# Patient Record
Sex: Male | Born: 1945 | Race: White | Hispanic: No | Marital: Married | Smoking: Former smoker
Health system: Southern US, Community
[De-identification: ages and names within clinical notes are randomized; demographics above are authoritative.]

## PROBLEM LIST (undated history)

## (undated) ENCOUNTER — Emergency Department (HOSPITAL_COMMUNITY): Admission: EM | Source: Home / Self Care

## (undated) DIAGNOSIS — G629 Polyneuropathy, unspecified: Secondary | ICD-10-CM

## (undated) DIAGNOSIS — K219 Gastro-esophageal reflux disease without esophagitis: Secondary | ICD-10-CM

## (undated) DIAGNOSIS — Z87442 Personal history of urinary calculi: Secondary | ICD-10-CM

## (undated) DIAGNOSIS — M1990 Unspecified osteoarthritis, unspecified site: Secondary | ICD-10-CM

## (undated) DIAGNOSIS — G4730 Sleep apnea, unspecified: Secondary | ICD-10-CM

## (undated) DIAGNOSIS — I252 Old myocardial infarction: Secondary | ICD-10-CM

## (undated) DIAGNOSIS — K5792 Diverticulitis of intestine, part unspecified, without perforation or abscess without bleeding: Secondary | ICD-10-CM

## (undated) DIAGNOSIS — C61 Malignant neoplasm of prostate: Secondary | ICD-10-CM

## (undated) DIAGNOSIS — Z9289 Personal history of other medical treatment: Secondary | ICD-10-CM

## (undated) DIAGNOSIS — J45909 Unspecified asthma, uncomplicated: Secondary | ICD-10-CM

## (undated) DIAGNOSIS — J449 Chronic obstructive pulmonary disease, unspecified: Secondary | ICD-10-CM

## (undated) DIAGNOSIS — Z9861 Coronary angioplasty status: Secondary | ICD-10-CM

## (undated) DIAGNOSIS — E7800 Pure hypercholesterolemia, unspecified: Secondary | ICD-10-CM

## (undated) DIAGNOSIS — I2510 Atherosclerotic heart disease of native coronary artery without angina pectoris: Secondary | ICD-10-CM

## (undated) DIAGNOSIS — Z87891 Personal history of nicotine dependence: Secondary | ICD-10-CM

## (undated) DIAGNOSIS — I10 Essential (primary) hypertension: Secondary | ICD-10-CM

## (undated) DIAGNOSIS — C4490 Unspecified malignant neoplasm of skin, unspecified: Secondary | ICD-10-CM

## (undated) DIAGNOSIS — Z794 Long term (current) use of insulin: Secondary | ICD-10-CM

## (undated) DIAGNOSIS — C801 Malignant (primary) neoplasm, unspecified: Secondary | ICD-10-CM

## (undated) DIAGNOSIS — M109 Gout, unspecified: Secondary | ICD-10-CM

## (undated) DIAGNOSIS — R0600 Dyspnea, unspecified: Secondary | ICD-10-CM

## (undated) DIAGNOSIS — E119 Type 2 diabetes mellitus without complications: Secondary | ICD-10-CM

## (undated) HISTORY — DX: Gastro-esophageal reflux disease without esophagitis: K21.9

## (undated) HISTORY — DX: Essential (primary) hypertension: I10

## (undated) HISTORY — DX: Gout, unspecified: M10.9

## (undated) HISTORY — DX: Old myocardial infarction: I25.2

## (undated) HISTORY — DX: Coronary angioplasty status: Z98.61

## (undated) HISTORY — DX: Long term (current) use of insulin: Z79.4

## (undated) HISTORY — PX: EYE SURGERY: SHX253

## (undated) HISTORY — PX: HUMERUS FRACTURE SURGERY: SHX670

## (undated) HISTORY — DX: Atherosclerotic heart disease of native coronary artery without angina pectoris: I25.10

## (undated) HISTORY — PX: OTHER SURGICAL HISTORY: SHX169

## (undated) HISTORY — DX: Type 2 diabetes mellitus without complications: E11.9

## (undated) HISTORY — PX: HERNIA REPAIR: SHX51

## (undated) HISTORY — PX: NASAL SINUS SURGERY: SHX719

## (undated) HISTORY — DX: Diverticulitis of intestine, part unspecified, without perforation or abscess without bleeding: K57.92

## (undated) HISTORY — DX: Pure hypercholesterolemia, unspecified: E78.00

## (undated) HISTORY — DX: Personal history of nicotine dependence: Z87.891

## (undated) HISTORY — PX: HEMORRHOID SURGERY: SHX153

---

## 2001-04-18 HISTORY — PX: CORONARY ANGIOPLASTY WITH STENT PLACEMENT: SHX49

## 2001-04-18 HISTORY — PX: CARDIAC CATHETERIZATION: SHX172

## 2001-04-22 ENCOUNTER — Inpatient Hospital Stay (HOSPITAL_COMMUNITY): Admission: EM | Admit: 2001-04-22 | Discharge: 2001-04-24 | Payer: Self-pay | Admitting: Cardiology

## 2001-04-22 DIAGNOSIS — I2510 Atherosclerotic heart disease of native coronary artery without angina pectoris: Secondary | ICD-10-CM

## 2001-04-22 HISTORY — DX: Atherosclerotic heart disease of native coronary artery without angina pectoris: I25.10

## 2008-06-24 HISTORY — PX: OTHER SURGICAL HISTORY: SHX169

## 2011-01-19 ENCOUNTER — Encounter: Payer: Self-pay | Admitting: Cardiovascular Disease

## 2011-01-20 ENCOUNTER — Ambulatory Visit: Payer: Self-pay | Admitting: Cardiovascular Disease

## 2011-01-20 ENCOUNTER — Ambulatory Visit (INDEPENDENT_AMBULATORY_CARE_PROVIDER_SITE_OTHER): Payer: Self-pay | Admitting: Cardiovascular Disease

## 2011-01-20 ENCOUNTER — Encounter: Payer: Self-pay | Admitting: Cardiovascular Disease

## 2011-01-20 ENCOUNTER — Encounter: Payer: Self-pay | Admitting: *Deleted

## 2011-01-20 DIAGNOSIS — E7800 Pure hypercholesterolemia, unspecified: Secondary | ICD-10-CM

## 2011-01-20 DIAGNOSIS — I2510 Atherosclerotic heart disease of native coronary artery without angina pectoris: Secondary | ICD-10-CM

## 2011-01-20 DIAGNOSIS — I10 Essential (primary) hypertension: Secondary | ICD-10-CM

## 2011-01-20 DIAGNOSIS — R079 Chest pain, unspecified: Secondary | ICD-10-CM

## 2011-01-20 MED ORDER — NITROGLYCERIN 0.4 MG SL SUBL: 0.4000 mg | SUBLINGUAL_TABLET | SUBLINGUAL | Status: DC | PRN

## 2011-01-20 MED ORDER — SIMVASTATIN 40 MG PO TABS: 40.0000 mg | ORAL_TABLET | Freq: Every evening | ORAL | Status: DC

## 2011-01-20 NOTE — Patient Instructions (Addendum)
Your physician has requested that you have a cardiac catheterization. Cardiac catheterization is used to diagnose and/or treat various heart conditions. Doctors may recommend this procedure for a number of different reasons. The most common reason is to evaluate chest pain. Chest pain can be a symptom of coronary artery disease (CAD), and cardiac catheterization can show whether plaque is narrowing or blocking your heart's arteries. This procedure is also used to evaluate the valves, as well as measure the blood flow and oxygen levels in different parts of your heart. For further information please visit [email or web address] Please follow instruction sheet, as given. Your physician recommends that you go to the [facility] for lab work: BMET/CBC/PT/PTT. DO TODAY! Start Simvastatin 40 mg Take 1 tablet by mouth every night.  Nitroglycerin Place one tablet under tongue every 5 minutes up to 3 doses as needed for chest pain. No more than 3 doses over a 15 minute period.

## 2011-01-21 ENCOUNTER — Encounter: Payer: Self-pay | Admitting: Cardiovascular Disease

## 2011-01-21 DIAGNOSIS — I10 Essential (primary) hypertension: Secondary | ICD-10-CM | POA: Insufficient documentation

## 2011-01-21 DIAGNOSIS — E7800 Pure hypercholesterolemia, unspecified: Secondary | ICD-10-CM | POA: Insufficient documentation

## 2011-01-21 NOTE — Assessment & Plan Note (Signed)
The patient has known history of coronary artery disease status post angioplasty and stent placement in 2003 to the proximal left anterior descending artery. The patient is describing recurrent anginal-type chest pain similar to his previous symptoms in 2003. The symptoms have been progressive and currently he is in [place] Heart Association class III. The symptoms are clearly interfering with his activities of daily living. Due to all of that, I recommend proceeding with cardiac catheterization and possible coronary intervention. Risks, benefits and alternatives were discussed with the patient. He is to continue aspirin daily. He is already on atenolol and amlodipine. I would also start him back on simvastatin 40 mg once daily for plaque stabilization.

## 2011-01-21 NOTE — Assessment & Plan Note (Signed)
His blood pressure is slightly elevated and will be monitored.

## 2011-01-21 NOTE — Assessment & Plan Note (Signed)
Given that the patient has type 2 diabetes as well as established coronary artery disease, there is an indication for a statin regardless of his baseline lipid profile.

## 2011-01-21 NOTE — Progress Notes (Signed)
HPI  This is a 65 year old gentleman who is here today for evaluation of chest pain. The patient has known history of coronary artery disease. He presented in 2003 with chest pain suggestive of unstable angina. He underwent cardiac catheterization at that time which showed a 60-70% stenosis in the proximal LAD. He had an IVUS interrogation which confirmed plaque rupture in that area. He had an angioplasty in bare-metal stent placement at that time without complications. Since then, he has not had any cardiac events. He has not been seen by [place] since then. He does have chronic medical conditions that include hypertension and type 2 diabetes. He is a previous smoker. The patient started having chest pain about 4 weeks ago similar to his previous symptoms in 2003. He describes substernal chest tightness lasting for about 10-15 minutes with activities. This is associated with significant dyspnea. It has been progressive since then and he cut down on his physical activity significantly due to the symptoms. He has not had chest pain at rest. He had one prolonged episode. He took nitroglycerin from his friend and his symptoms resolved completely. The patient is not on a statin anymore.  No Known Allergies   No current outpatient prescriptions on file prior to visit.     Past Medical History  Diagnosis Date  . Old myocardial infarction   . Type II or unspecified type diabetes mellitus without mention of complication, not stated as uncontrolled     TYPE 11  . Encounter for long-term (current) use of insulin   . Gout, unspecified   . Personal history of tobacco use, presenting hazards to health   . Diverticulitis   . Postsurgical percutaneous transluminal coronary angioplasty status   . Esophageal reflux   . Coronary atherosclerosis of native coronary artery  04/22/2001       Cardiac Catheterization  . Pure hypercholesterolemia   . Unspecified essential hypertension      Past Surgical History    Procedure Date  . Hemorrhoid surgery   . Hernia repair   . Carotid stents   . 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 Cardiac catheterization 2003    70% proximal LAD (plaque rupture), 40 % mid. Left dominent  . Coronary angioplasty with stent placement 2003    Prox LAD: 3.0 X12 BMS     Family History  Problem Relation Age of Onset  . Coronary artery disease      Family History     History   Social History  . Marital Status: Married    Spouse Name: PATRICIA    Number of Children: N/A  . Years of Education: N/A   Occupational History  . SELF EMPLOYED    Social History Main Topics  . Smoking status: Former Smoker    Quit date: 04/18/1985  . Smokeless tobacco: Not on file  . Alcohol Use: Yes     Occasionaly  . Drug Use: No  . Sexually Active: Not on file   Other Topics Concern  . Not on file   Social History Narrative  . No narrative on file     ROS Constitutional: Negative for fever, chills, diaphoresis, activity change, appetite change and fatigue.  HENT: Negative for hearing loss, nosebleeds, congestion, sore throat, facial swelling, drooling, trouble swallowing, neck pain, voice change, sinus pressure and tinnitus.  Eyes: Negative for photophobia, pain, discharge and visual disturbance.  Respiratory: Negative for apnea, cough,  and wheezing.  Cardiovascular: Negative for  palpitations and leg swelling.  Gastrointestinal: Negative for nausea, vomiting, abdominal pain, diarrhea, constipation, blood in stool and abdominal distention.  Genitourinary: Negative for dysuria, urgency, frequency, hematuria and decreased urine volume.  Musculoskeletal: Negative for myalgias, back pain, joint swelling, arthralgias and gait problem.  Skin: Negative for color change, pallor, rash and wound.  Neurological: Negative for dizziness, tremors, seizures, syncope, speech difficulty, weakness, light-headedness, numbness and headaches.   Psychiatric/Behavioral: Negative for suicidal ideas, hallucinations, behavioral problems and agitation. The patient is not nervous/anxious.     PHYSICAL EXAM   BP 155/80  Pulse 77  Ht 5\' 10"  (1.778 m)  Wt 221 lb 12.8 oz (100.608 kg)  BMI 31.83 kg/m2  Constitutional: He is oriented to person, place, and time. He appears well-developed and well-nourished. No distress.  HENT: No nasal discharge.  Head: Normocephalic and atraumatic.  Eyes: Pupils are equal, round, and reactive to light. Right eye exhibits no discharge. Left eye exhibits no discharge.  Neck: Normal range of motion. Neck supple. No JVD present. No thyromegaly present.  Cardiovascular: Normal rate, regular rhythm, normal heart sounds and intact distal pulses. Exam reveals no gallop and no friction rub.  No murmur heard.  Pulmonary/Chest: Effort normal and breath sounds normal. No stridor. No respiratory distress. He has no wheezes. He has no rales. He exhibits no tenderness.  Abdominal: Soft. Bowel sounds are normal. He exhibits no distension. There is no tenderness. There is no rebound and no guarding.  Musculoskeletal: Normal range of motion. He exhibits no edema and no tenderness.  Neurological: He is alert and oriented to person, place, and time. Coordination normal.  Skin: Skin is warm and dry. No rash noted. He is not diaphoretic. No erythema. No pallor.  Psychiatric: He has a normal mood and affect. His behavior is normal. Judgment and thought content normal.      EKG: Normal sinus rhythm with no significant ST or T wave changes. Possible old inferior infarct with Q waves in 3 and aVF   ASSESSMENT AND PLAN

## 2011-01-26 ENCOUNTER — Inpatient Hospital Stay (HOSPITAL_BASED_OUTPATIENT_CLINIC_OR_DEPARTMENT_OTHER)
Admission: RE | Admit: 2011-01-26 | Discharge: 2011-01-26 | Disposition: A | Discharge status: Hospice | Payer: Self-pay | Source: Ambulatory Visit | Attending: Cardiovascular Disease | Admitting: Cardiovascular Disease

## 2011-01-26 ENCOUNTER — Ambulatory Visit (HOSPITAL_COMMUNITY)
Admission: AD | Admit: 2011-01-26 | Discharge: 2011-01-28 | Disposition: A | Discharge status: Home / Self Care | Payer: Self-pay | Source: Ambulatory Visit | Attending: Cardiovascular Disease | Admitting: Cardiovascular Disease

## 2011-01-26 DIAGNOSIS — I214 Non-ST elevation (NSTEMI) myocardial infarction: Secondary | ICD-10-CM | POA: Insufficient documentation

## 2011-01-26 DIAGNOSIS — I2510 Atherosclerotic heart disease of native coronary artery without angina pectoris: Secondary | ICD-10-CM | POA: Insufficient documentation

## 2011-01-26 DIAGNOSIS — T82897A Other specified complication of cardiac prosthetic devices, implants and grafts, initial encounter: Secondary | ICD-10-CM | POA: Insufficient documentation

## 2011-01-26 DIAGNOSIS — K219 Gastro-esophageal reflux disease without esophagitis: Secondary | ICD-10-CM | POA: Insufficient documentation

## 2011-01-26 DIAGNOSIS — I10 Essential (primary) hypertension: Secondary | ICD-10-CM | POA: Insufficient documentation

## 2011-01-26 DIAGNOSIS — I519 Heart disease, unspecified: Secondary | ICD-10-CM | POA: Insufficient documentation

## 2011-01-26 DIAGNOSIS — Y831 Surgical operation with implant of artificial internal device as the cause of abnormal reaction of the patient, or of later complication, without mention of misadventure at the time of the procedure: Secondary | ICD-10-CM | POA: Insufficient documentation

## 2011-01-26 DIAGNOSIS — E119 Type 2 diabetes mellitus without complications: Secondary | ICD-10-CM | POA: Insufficient documentation

## 2011-01-26 DIAGNOSIS — E785 Hyperlipidemia, unspecified: Secondary | ICD-10-CM | POA: Insufficient documentation

## 2011-01-26 DIAGNOSIS — Z9861 Coronary angioplasty status: Secondary | ICD-10-CM | POA: Insufficient documentation

## 2011-01-26 DIAGNOSIS — Y9210 Unspecified residential institution as the place of occurrence of the external cause: Secondary | ICD-10-CM | POA: Insufficient documentation

## 2011-01-26 DIAGNOSIS — Y840 Cardiac catheterization as the cause of abnormal reaction of the patient, or of later complication, without mention of misadventure at the time of the procedure: Secondary | ICD-10-CM | POA: Insufficient documentation

## 2011-01-26 HISTORY — PX: CORONARY ANGIOPLASTY WITH STENT PLACEMENT: SHX49

## 2011-01-26 HISTORY — PX: CARDIAC CATHETERIZATION: SHX172

## 2011-01-26 LAB — POCT ACTIVATED CLOTTING TIME
Activated Clotting Time: 171 seconds
Activated Clotting Time: 584 seconds

## 2011-01-26 LAB — POCT I-STAT GLUCOSE: Glucose, Bld: 220 mg/dL — ABNORMAL HIGH (ref 70–99)

## 2011-01-26 LAB — GLUCOSE, CAPILLARY
Glucose-Capillary: 197 mg/dL — ABNORMAL HIGH (ref 70–99)
Glucose-Capillary: 302 mg/dL — ABNORMAL HIGH (ref 70–99)

## 2011-01-27 LAB — CBC
HCT: 38 % — ABNORMAL LOW (ref 39.0–52.0)
Hemoglobin: 14 g/dL (ref 13.0–17.0)
MCH: 33.3 pg (ref 26.0–34.0)
MCHC: 36.8 g/dL — ABNORMAL HIGH (ref 30.0–36.0)
RBC: 4.2 MIL/uL — ABNORMAL LOW (ref 4.22–5.81)

## 2011-01-27 LAB — BASIC METABOLIC PANEL
BUN: 17 mg/dL (ref 6–23)
CO2: 25 mEq/L (ref 19–32)
GFR calc non Af Amer: >90 mL/min (ref >90)
Glucose, Bld: 325 mg/dL — ABNORMAL HIGH (ref 70–99)
Potassium: 3.7 mEq/L (ref 3.5–5.1)
Sodium: 135 mEq/L (ref 135–145)

## 2011-01-27 LAB — GLUCOSE, CAPILLARY
Glucose-Capillary: 355 mg/dL — ABNORMAL HIGH (ref 70–99)
Glucose-Capillary: 329 mg/dL — ABNORMAL HIGH (ref 70–99)
Glucose-Capillary: 240 mg/dL — ABNORMAL HIGH (ref 70–99)
Glucose-Capillary: 324 mg/dL — ABNORMAL HIGH (ref 70–99)

## 2011-01-27 LAB — CARDIAC PANEL(CRET KIN+CKTOT+MB+TROPI)
CK, MB: 45.9 ng/mL (ref 0.3–4.0)
CK, MB: 42.4 ng/mL (ref 0.3–4.0)
Relative Index: 8.5 — ABNORMAL HIGH (ref 0.0–2.5)
Total CK: 538 U/L — ABNORMAL HIGH (ref 7–232)
Troponin I: 19.13 ng/mL (ref <0.30)

## 2011-01-28 LAB — CARDIAC PANEL(CRET KIN+CKTOT+MB+TROPI)
CK, MB: 13.8 ng/mL (ref 0.3–4.0)
Total CK: 243 U/L — ABNORMAL HIGH (ref 7–232)
Troponin I: 7.2 ng/mL (ref <0.30)

## 2011-01-28 LAB — BASIC METABOLIC PANEL
Calcium: 8.7 mg/dL (ref 8.4–10.5)
GFR calc Af Amer: >90 mL/min (ref >90)
GFR calc non Af Amer: 86 mL/min — ABNORMAL LOW (ref >90)
Glucose, Bld: 277 mg/dL — ABNORMAL HIGH (ref 70–99)
Potassium: 3.6 mEq/L (ref 3.5–5.1)
Sodium: 136 mEq/L (ref 135–145)

## 2011-01-28 LAB — GLUCOSE, CAPILLARY: Glucose-Capillary: 328 mg/dL — ABNORMAL HIGH (ref 70–99)

## 2011-02-01 NOTE — Cardiovascular Report (Signed)
NAME:  [name], RUDDY NO.:  [id number]  MEDICAL RECORD NO.:  [id number]  LOCATION:  [facility]                         FACILITY:  [facility]  PHYSICIAN:  Lorine Bears, MD     DATE OF BIRTH:  01/15/1946  DATE OF PROCEDURE:  01/26/2011 DATE OF DISCHARGE:                           CARDIAC CATHETERIZATION     PROCEDURES PERFORMED: 1. Left heart catheterization. 2. Coronary angiography. 3. Left ventricular angiography.  INDICATION AND CLINICAL HISTORY:  This is a 65 year old gentleman with known history of coronary artery disease status post angioplasty and bare-metal stent placement to the LAD in 2003.  He presented with progressive symptoms of exertional chest pain happening with minimal activities recently requiring nitroglycerin.  Due to his symptoms and previous cardiac history, cardiac catheterization and possible coronary intervention were recommended.  Risks, benefits, and alternatives were discussed with the patient.  STUDY DETAILS:  A standard informed consent was obtained.  The right groin area was prepped in a sterile fashion.  It was anesthetized with 1% lidocaine.  A 4-French sheath was placed in the right femoral artery after an anterior puncture.  Coronary angiography was performed with a JL-4, 3-DRC, and a pigtail catheter.  All catheter exchanges were done over the wire.  The patient tolerated the procedure well with no immediate complications.  STUDY FINDINGS:  Hemodynamic findings:  Left ventricular pressure is 176/17, with a left ventricular end-diastolic pressure of 19 mmHg. Aortic pressure is 175/75 with a mean pressure of 118 mmHg. Left ventricular angiography:  This showed normal LV systolic function and wall motion with an estimated ejection fraction of 65%.  No significant mitral regurgitation is noted.  Coronary angiography: 1. Left main coronary artery:  The vessel is normal in size with minor     irregularities but no evidence of  obstructive disease. 2. Left circumflex artery:  The vessel is very large in size and     dominant.  It is mildly calcified in the proximal segment with a     30% proximal stenosis.  The rest of the circumflex has minor     irregularities.  OM-1 is a very small-sized branch.  OM-2 is normal     in size with 30-40% ostial stenosis.  OM-3 is normal in size with     mild diffuse 20% disease.  The posterior AV groove is a large size     branch that gives PDA and posterolateral branches.  All have mild     diffuse atherosclerosis but no evidence of obstructive disease. 3. Left anterior descending artery:  The vessel is large in size and     wraps around the apex distally.  A stent is noted proximally with     minimal in-stent restenosis.  It appears that there is another     stent in the mid segment which is also patent with minimal     restenosis.  In the mid LAD between the 2 segments, there seems to     be a severe 90% eccentric stenosis.  The rest of the LAD has mild     atherosclerosis but no evidence of obstructive disease.  Thediagonal branches are medium in size and free of  significant     disease. 4. Right coronary artery:  The vessel is small in size and     nondominant.  It has mild diffuse atherosclerosis but no     obstructive disease.  STUDY CONCLUSIONS: 1. Severe single-vessel coronary artery disease with a 90% eccentric     stenosis in the mid left anterior descending artery. 2. Normal LV systolic function. 3. Recommend urgent angioplasty and stent placement to the left     anterior descending artery.     Lorine Bears, MD     MA/MEDQ  D:  01/26/2011  T:  01/26/2011  Job:  [id number]  Electronically Signed by Lorine Bears MD on 02/01/2011 04:29:13 PM

## 2011-02-01 NOTE — Cardiovascular Report (Signed)
  NAME:  [name], [name] NO.:  [id number]  MEDICAL RECORD NO.:  [id number]  LOCATION:  2501                         FACILITY:  [facility]  PHYSICIAN:  Lorine Bears, MD     DATE OF BIRTH:  25-Jan-1946  DATE OF PROCEDURE:  01/26/2011 DATE OF DISCHARGE:                           CARDIAC CATHETERIZATION   PROCEDURES PERFORMED:  Left anterior descending artery, angioplasty, and bare-metal stent placement, 90% mid stenosis resulting an TIMI-3 flow and 10% residual stenosis.  INDICATION AND CLINICAL HISTORY:  Please refer to cardiac catheterization from today for full indications and the clinical findings.  STUDY DETAILS:  The 4-French sheath was exchanged to a 6-French sheath. IV bivalirudin was initiated with therapeutic ACT.  The patient was already loaded on aspirin and Plavix.  An XB 3.5 guiding catheter was used.  The lesion was crossed with a P[place] wire.  I tried to pre- dilate the lesion with a 2.5 x 15 Sprinter balloon, but that balloon did not cross.  I thus decided to place another wire as a buddy and used a BMW wire.  I then was able to cross with a 2.0 x 12 mm Emerge balloon. The lesion was dilated twice to 10 atmospheres and 12 atmospheres.  I then tried to use a 2.75 cutting balloon, but that did not cross partially due to a previously placed stent and also calcifications.  I decided to use a 2.75 x 12 mm Bee Trek balloon and the lesion was dilated to 16 atmospheres.  I then placed a 3.0 x 15 mm Multilink vision stent and deployed it to 14 atmospheres.  This was post dilated with an [place] Sprinter 3.25 x 12 mm to 18 atmospheres.  Angiography showed good results with 10% residual stenosis. medium-size septal branch was jailed by the stent with TIMI 0 flow. This was not rescued.   STUDY CONCLUSIONS:  1. Successful angioplasty and bare-metal stent placement to the mid LAD.  The stenosis appears to be just distal to a previously placed stent.  This was a  difficult procedure due to difficulty in crossing the lesion with a balloon due to eccentricity and the calcifications. 2. a septal branch was jailed by the stent with subsequent TIMI 0 flow. This branch was medium in size and about 1.5 mm in diameter. It was not rescued.  RECOMMENDATIONS:  I recommend dual antiplatelet therapy for at least 3 months and if possible for 1 full year.  Blood pressure control is recommended as well as aggressive treatment of risk factors and hyperlipidemia.     Lorine Bears, MD     MA/MEDQ  D:  01/26/2011  T:  01/26/2011  Job:  [id number]  Electronically Signed by Lorine Bears MD on 02/01/2011 04:32:16 PM

## 2011-02-13 NOTE — Discharge Summary (Signed)
NAMEMarland Kitchen  KIPPER, BUCH NO.:  [id number]  MEDICAL RECORD NO.:  [id number]  LOCATION:  2501                         FACILITY:  [facility]  PHYSICIAN:  Veverly Fells. Excell Seltzer, MD  DATE OF BIRTH:  [birth year]  DATE OF ADMISSION:  01/26/2011 DATE OF DISCHARGE:  01/28/2011                              DISCHARGE SUMMARY   PRIMARY CARDIOLOGIST:  Lorine Bears, MD  PRIMARY CARE PROVIDER:  Marjean Donna in [place], [place].  DISCHARGE DIAGNOSIS:  Coronary artery disease with postprocedure non-ST- elevation myocardial infarction.  SECONDARY DIAGNOSES: 1. Type 2 diabetes mellitus. 2. Hypertension. 3. Hyperlipidemia. 4. Gastroesophageal reflux disease. 5. Gout. 6. History of diverticulitis. 7. History of carotid arterial disease status post stenting.  ALLERGIES:  No known drug allergies.  PROCEDURES:  Left heart cardiac catheterization performed on January 26, 2011, revealing a 90% stenosis within the proximal LAD between the 2 previously placed patent stents.  The patient otherwise had nonobstructive disease in a nondominant right coronary artery.  The LAD was successfully stented with placement of a 3.0 x 15-mm Multi-Link Vision bare metal stent.  HISTORY OF PRESENT ILLNESS:  A 65 year old male with prior history of CAD and LAD stenting in 2003, who was recently seen in clinic by Dr. Kirke Corin on January 20, 2011, secondary to a 4-week history of recurrent substernal exertional chest tightness.  Decision was made to proceed with diagnostic catheterization.  HOSPITAL COURSE:  The patient presented to the [facility] on January 26, 2011, where diagnostic catheterization was carried out revealing a patent stent in the proximal LAD followed by a 90% stenosis and then subsequently another patent stent in the proximal-to-mid LAD. The patient had nonobstructive disease throughout the left circumflex and a nondominant right coronary artery.  EF was 65%.  Attention  was turned to the LAD between the 2 previously stented segments and this area was successfully stented with a 3.0 x 15-mm Vision bare metal stent.  Immediately postprocedure, the patient did complain of chest discomfort, though his ECG remained without acute changes.  The patient was markedly hypertensive following percutaneous intervention requiring titration of IV nitroglycerin as well as p.r.n. labetalol and hydralazine.  The patient continued to complain of intermittent chest discomfort throughout the night without acute changes on his ECG and followup cardiac markers revealed a peak CK of 538 with an MB of 45.9, troponin I of 19.49.  Films were reviewed and it was noted that in the process of stenting of the LAD, a small septal perforator branch was occluded, thus explained the patient's symptoms and periprocedural infarct.  He has been.  He was placed on a long-acting nitrate therapy and his beta-blocker and ACE inhibitor have been titrated for better blood pressure control.  With these changes, the patient had no recurrence of chest discomfort in the past 24 hours and has been ambulating without difficulty.  His enzymes are now trending down and we plan to discharge him home today in good condition.  DISCHARGE LABORATORY DATA:  Hemoglobin 14.0, hematocrit 38.0, WBC 10.0, platelets 128.  Sodium 136, potassium 3.6, chloride 101, CO2 of 27, BUN 18, creatinine 0.97, glucose 277.  CK 243, MB 13.0, troponin I 7.20.  DISPOSITION:  The patient will be discharged home today in good condition.  FOLLOWUP PLANS AND APPOINTMENTS:  We have arranged for followup with Dr. Kirke Corin in our [place] office on February 15, 2011, at 1:30 p.m.  He will follow up with Dr. Verlan Friends in [place] in the next 3-4 weeks.  DISCHARGE MEDICATIONS: 1. Carvedilol 25 mg b.i.d. 2. Plavix 75 mg daily. 3. Imdur 30 mg daily. 4. Lisinopril 20 mg daily/ 5. Lantus 44 units subcu at bedtime. 6. Amlodipine 5 mg daily. 7.  Aspirin 81 mg daily. 8. Fexofenadine 180 mg daily. 9. Glimepiride 4 mg b.i.d. 10.Metformin 1000 mg b.i.d. 11.Multivitamin 1 tablet daily. 12.Nitroglycerin 0.4 mg subcu p.r.n. chest pain. 13.Protonix 40 mg daily. 14.Simvastatin 40 mg at bedtime. 15.Zolpidem 5 mg a quarter of a tablet at bedtime p.r.n.  OUTSTANDING LABS AND STUDIES:  Followup with LFTs in 6-8 weeks.  The patient was recently placed on statin therapy in the outpatient setting.  DURATION OF DISCHARGE ENCOUNTER:  Sixty minutes including physician time.     Nicolasa Ducking, ANP   ______________________________ Veverly Fells. Excell Seltzer, MD    CB/MEDQ  D:  01/28/2011  T:  01/28/2011  Job:  [id number]  cc:   Marjean Donna  Electronically Signed by Nicolasa Ducking ANP on 02/08/2011 04:17:01 PM Electronically Signed by Tonny Bollman MD on 02/13/2011 10:04:50 PM

## 2011-02-15 ENCOUNTER — Encounter: Payer: Self-pay | Admitting: Cardiovascular Disease

## 2011-02-15 ENCOUNTER — Ambulatory Visit (INDEPENDENT_AMBULATORY_CARE_PROVIDER_SITE_OTHER): Payer: Self-pay | Admitting: Cardiovascular Disease

## 2011-02-15 DIAGNOSIS — I2510 Atherosclerotic heart disease of native coronary artery without angina pectoris: Secondary | ICD-10-CM

## 2011-02-15 DIAGNOSIS — E7800 Pure hypercholesterolemia, unspecified: Secondary | ICD-10-CM

## 2011-02-15 DIAGNOSIS — I10 Essential (primary) hypertension: Secondary | ICD-10-CM

## 2011-02-15 MED ORDER — CLOPIDOGREL BISULFATE 75 MG PO TABS: 75.0000 mg | ORAL_TABLET | Freq: Every day | ORAL | Status: AC

## 2011-02-15 NOTE — Assessment & Plan Note (Signed)
His blood pressure is now well controlled. Continue current medications. Imdur will be stopped as mentioned above.

## 2011-02-15 NOTE — Assessment & Plan Note (Signed)
He tells me that he had his labs checked with Dr. Verlan Friends his primary care physician. I recommend that he stays on simvastatin even if his cholesterol is low given that his coronary arteries are relatively heavily calcified.

## 2011-02-15 NOTE — Assessment & Plan Note (Signed)
The patient had a successful angioplasty and bare-metal stent placement to a severely stenosed mid LAD. He is not having any more anginal chest pain but is complaining of headache which is likely due to Imdur. Thus, I will stop this medication. He is to continue Plavix for a total of 3 months. It can be stopped in January. He is to continue aspirin indefinitely. Aggressive treatment of risk factors is recommended.

## 2011-02-15 NOTE — Progress Notes (Signed)
HPI  This is a 65 year old man who is here today for followup visit. He was seen recently for classic anginal chest pain with minimal activities. He has known history of coronary artery disease status post angioplasty and bare-metal stent placement to the left anterior descending artery in 2003. Due to his symptoms and previous cardiac history, I decided to proceed with cardiac catheterization which showed a patent stent in the proximal LAD segment. However, there was a 90% calcified and eccentric lesion in the midsegment. He underwent an angioplasty and bare-metal stent placement. In the process, a septal branch was jailed which caused TIMI 0 flow. The branch was overall small in size and was not rescued. He did have periprocedural myocardial infarction with elevated cardiac enzymes. He was treated conservatively for that with a pressure control and nitroglycerin. He was discharged home without any other issues. Since his discharge, he hasn't had any exertional chest pain. He did have a few episodes of sharp chest discomfort which is different from his previous symptoms. He does complain of headache though since he was discharged from the hospital.  No Known Allergies   Current Outpatient Prescriptions on File Prior to Visit  Medication Sig Dispense Refill  . amLODipine (NORVASC) 5 MG tablet Take 5 mg by mouth daily.        Marland Kitchen aspirin 81 MG tablet Take 81 mg by mouth daily.        . fexofenadine (ALLEGRA) 180 MG tablet Take 180 mg by mouth daily.        Marland Kitchen glimepiride (AMARYL) 4 MG tablet Take 4 mg by mouth 2 (two) times daily.        . insulin glargine (LANTUS) 100 UNIT/ML injection Inject 40 Units into the skin at bedtime.        . metFORMIN (GLUCOPHAGE) 1000 MG tablet Take 1,000 mg by mouth 2 (two) times daily.        . Multiple Vitamins-Minerals (CENTRUM PO) Take by mouth daily.        . nitroGLYCERIN (NITROSTAT) 0.4 MG SL tablet Place 1 tablet (0.4 mg total) under the tongue every 5 (five) minutes  as needed for chest pain.  25 tablet  3  . pantoprazole (PROTONIX) 40 MG tablet Take 40 mg by mouth daily.        . simvastatin (ZOCOR) 40 MG tablet Take 1 tablet (40 mg total) by mouth every evening.  30 tablet  6  . zolpidem (AMBIEN) 5 MG tablet Take 1/4 tab every evening            Past Medical History  Diagnosis Date  . Old myocardial infarction   . Type II or unspecified type diabetes mellitus without mention of complication, not stated as uncontrolled     TYPE 11  . Encounter for long-term (current) use of insulin   . Gout, unspecified   . Personal history of tobacco use, presenting hazards to health   . Diverticulitis   . Postsurgical percutaneous transluminal coronary angioplasty status   . Esophageal reflux   . Coronary atherosclerosis of native coronary artery  04/22/2001       Cardiac Catheterization  . Pure hypercholesterolemia   . Unspecified essential hypertension      Past Surgical History  Procedure Date  . Hemorrhoid surgery   . Hernia repair   . Carotid stents   . 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 Cardiac catheterization 2003    70% proximal  LAD (plaque rupture), 40 % mid. Left dominent  . Coronary angioplasty with stent placement 2003    Prox LAD: 3.0 X12 BMS  . Cardiac catheterization 01/26/2011    LAD: Patent proximal stent. 90% calcified eccentic stenosis in med segment, mild LCX disease  . Coronary angioplasty with stent placement 01/26/2011    Mid LAD: 3.0 X15 mm Vision BMS. Complicated by a jailed septal branch and periprocedural MI     Family History  Problem Relation Age of Onset  . Coronary artery disease      Family History     History   Social History  . Marital Status: Married    Spouse Name: PATRICIA    Number of Children: N/A  . Years of Education: N/A   Occupational History  . SELF EMPLOYED    Social History Main Topics  . Smoking status: Former Smoker    Quit date: 04/18/1985  .  Smokeless tobacco: Not on file  . Alcohol Use: Yes     Occasionaly  . Drug Use: No  . Sexually Active: Not on file   Other Topics Concern  . Not on file   Social History Narrative  . No narrative on file     PHYSICAL EXAM   BP 117/78  Pulse 89  Resp 16  Ht 5\' 10"  (1.778 m)  Wt 222 lb (100.699 kg)  BMI 31.85 kg/m2  Constitutional: He is oriented to person, place, and time. He appears well-developed and well-nourished. No distress.  HENT: No nasal discharge.  Head: Normocephalic and atraumatic.  Eyes: Pupils are equal, round, and reactive to light. Right eye exhibits no discharge. Left eye exhibits no discharge.  Neck: Normal range of motion. Neck supple. No JVD present. No thyromegaly present.  Cardiovascular: Normal rate, regular rhythm, normal heart sounds and intact distal pulses. Exam reveals no gallop and no friction rub.  No murmur heard.  Pulmonary/Chest: Effort normal and breath sounds normal. No stridor. No respiratory distress. He has no wheezes. He has no rales. He exhibits no tenderness.  Abdominal: Soft. Bowel sounds are normal. He exhibits no distension. There is no tenderness. There is no rebound and no guarding.  Musculoskeletal: Normal range of motion. He exhibits no edema and no tenderness.  Neurological: He is alert and oriented to person, place, and time. Coordination normal.  Skin: Skin is warm and dry. No rash noted. He is not diaphoretic. No erythema. No pallor.  Psychiatric: He has a normal mood and affect. His behavior is normal. Judgment and thought content normal.  Right groin is intact with no hematoma or bruit.      ASSESSMENT AND PLAN

## 2011-02-15 NOTE — Patient Instructions (Signed)
Your physician wants you to follow-up in: 4 months. You will receive a reminder letter in the mail one-two months in advance. If you don't receive a letter, please call our office to schedule the follow-up appointment. Stop Imdur (isosorbide) Stop Plavix on 04/28/2011.

## 2011-04-19 DIAGNOSIS — I252 Old myocardial infarction: Secondary | ICD-10-CM

## 2011-04-19 HISTORY — DX: Old myocardial infarction: I25.2

## 2013-02-07 ENCOUNTER — Other Ambulatory Visit: Payer: Self-pay | Admitting: Cardiovascular Disease

## 2013-04-18 HISTORY — PX: OTHER SURGICAL HISTORY: SHX169

## 2013-12-10 ENCOUNTER — Ambulatory Visit (INDEPENDENT_AMBULATORY_CARE_PROVIDER_SITE_OTHER): Payer: Medicare Other | Admitting: Cardiology

## 2013-12-10 ENCOUNTER — Encounter: Payer: Self-pay | Admitting: Cardiology

## 2013-12-10 VITALS — BP 151/79 | HR 90 | Ht 70.0 in | Wt 223.0 lb

## 2013-12-10 DIAGNOSIS — I2510 Atherosclerotic heart disease of native coronary artery without angina pectoris: Secondary | ICD-10-CM

## 2013-12-10 DIAGNOSIS — R0789 Other chest pain: Secondary | ICD-10-CM

## 2013-12-10 DIAGNOSIS — I10 Essential (primary) hypertension: Secondary | ICD-10-CM

## 2013-12-10 MED ORDER — ATORVASTATIN CALCIUM 80 MG PO TABS: 80.0000 mg | ORAL_TABLET | Freq: Every day | ORAL | Status: DC

## 2013-12-10 MED ORDER — AMLODIPINE BESYLATE 10 MG PO TABS: 10.0000 mg | ORAL_TABLET | Freq: Every day | ORAL | Status: DC

## 2013-12-10 NOTE — Progress Notes (Signed)
Clinical Summary Mr. Nikolai is a 68 y.o.male last seen by Dr Fletcher Anon approx 4 years ago, this is our first visit together. He is seen today for the following medical problems.  1. CAD - hx of BMS to LAD in 2003 - last cath 01/2011 with LM patent, LAD with prox stent mild ISR, patent mid stent, 90% mid LAD lesion. LCX 30% prox, OM2 30-40%, RCA small non-dom with no disease. Received additional stent to LAD at that time, a septal [place] was jailed. LVEF 65% by LV gram. - imdur previously caused headaches, now off  - has had some chest pain recently.  Sharp pain right side, 9/10. Can occur at rest or with exertion. +SOB. Took NG with some improvement. Not positional. Pain lasted approx 5-10 minutes. Has had 4-5 episodes over the last 6 months. Increase in frequency. Has had some increased in frequency.  - stable DOE with activities, mainly walking uphill.  - admitted to [facility], discharged after 24 hrs. CT PE negative. EKG without acute ischemic changes. Other records not available at this time.  - reports stress test recently, approx 3 months ago in [place], [place] by a Dr Su Monks  2. HTN - does not check regularly - compliant with meds, though has not taken yet today.  3. Hyperlipidemia - compliant with statin   Past Medical History  Diagnosis Date  . Old myocardial infarction   . Type II or unspecified type diabetes mellitus without mention of complication, not stated as uncontrolled     TYPE 11  . Encounter for long-term (current) use of insulin   . Gout, unspecified   . Personal history of tobacco use, presenting hazards to health   . Diverticulitis   . Postsurgical percutaneous transluminal coronary angioplasty status   . Esophageal reflux   . Coronary atherosclerosis of native coronary artery  04/22/2001       Cardiac Catheterization  . Pure hypercholesterolemia   . Unspecified essential hypertension      No Known Allergies   Current Outpatient  Prescriptions  Medication Sig Dispense Refill  . ALPRAZOLAM PO Take by mouth as needed.        Marland Kitchen amLODipine (NORVASC) 5 MG tablet Take 5 mg by mouth daily.        Marland Kitchen aspirin 81 MG tablet Take 81 mg by mouth daily.        . carvedilol (COREG) 25 MG tablet Take 25 mg by mouth 2 (two) times daily with a meal.        . fexofenadine (ALLEGRA) 180 MG tablet Take 180 mg by mouth daily.        Marland Kitchen glimepiride (AMARYL) 4 MG tablet Take 4 mg by mouth 2 (two) times daily.        . insulin glargine (LANTUS) 100 UNIT/ML injection Inject 40 Units into the skin at bedtime.        . insulin lispro (HUMALOG) 100 UNIT/ML injection Inject into the skin as directed.        . metFORMIN (GLUCOPHAGE) 1000 MG tablet Take 1,000 mg by mouth 2 (two) times daily.        . Multiple Vitamins-Minerals (CENTRUM PO) Take by mouth daily.        Marland Kitchen Neomycin-Polymyxin (ANTIBIOTIC EX) Take by mouth daily.        . nitroGLYCERIN (NITROSTAT) 0.4 MG SL tablet Place 1 tablet (0.4 mg total) under the tongue every 5 (five) minutes as needed for chest pain.  25  tablet  3  . pantoprazole (PROTONIX) 40 MG tablet Take 40 mg by mouth daily.        . simvastatin (ZOCOR) 40 MG tablet Take 1 tablet (40 mg total) by mouth every evening.  30 tablet  6  . zolpidem (AMBIEN) 5 MG tablet Take 1/4 tab every evening          No current facility-administered medications for this visit.     Past Surgical History  Procedure Laterality Date  . Hemorrhoid surgery    . Hernia repair    . Carotid stents    . Amputation of left index finger  06/24/2008    Smashed in [place]   . Cardiac catheterization  2003    70% proximal LAD (plaque rupture), 40 % mid. Left dominent  . Coronary angioplasty with stent placement  2003    Prox LAD: 3.0 X12 BMS  . Cardiac catheterization  01/26/2011    LAD: Patent proximal stent. 90% calcified eccentic stenosis in med segment, mild LCX disease  . Coronary angioplasty with stent placement  01/26/2011    Mid LAD: 3.0 X15 mm  Vision BMS. Complicated by a jailed septal [place]un Hillmer and periprocedural MI     No Known Allergies    Family History  Problem Relation Age of Onset  . Coronary artery disease      Family History     Social History Mr. Berhane reports that he quit smoking about 28 years ago. He does not have any smokeless tobacco history on file. Mr. Goto reports that he drinks alcohol.   Review of Systems CONSTITUTIONAL: No weight loss, fever, chills, weakness or fatigue.  HEENT: Eyes: No visual loss, blurred vision, double vision or yellow sclerae.No hearing loss, sneezing, congestion, runny nose or sore throat.  SKIN: No rash or itching.  CARDIOVASCULAR: per HPI RESPIRATORY: No shortness of breath, cough or sputum.  GASTROINTESTINAL: No anorexia, nausea, vomiting or diarrhea. No abdominal pain or blood.  GENITOURINARY: No burning on urination, no polyuria NEUROLOGICAL: No headache, dizziness, syncope, paralysis, ataxia, numbness or tingling in the extremities. No change in bowel or bladder control.  MUSCULOSKELETAL: No muscle, back pain, joint pain or stiffness.  LYMPHATICS: No enlarged nodes. No history of splenectomy.  PSYCHIATRIC: No history of depression or anxiety.  ENDOCRINOLOGIC: No reports of sweating, cold or heat intolerance. No polyuria or polydipsia.  Marland Kitchen   Physical Examination p 90 bp 151/79 Wt 223 lbs BMI 32 Gen: resting comfortably, no acute distress HEENT: no scleral icterus, pupils equal round and reactive, no palptable cervical adenopathy,  CV: RRR, no m/r/g, no JVD, no carotid bruits Resp: Clear to auscultation bilaterally GI: abdomen is soft, non-tender, non-distended, normal bowel sounds, no hepatosplenomegaly MSK: extremities are warm, no edema.  Skin: warm, no rash Neuro:  no focal deficits Psych: appropriate affect   Diagnostic Studies Cath 01/2011  STUDY FINDINGS: Hemodynamic findings: Left ventricular pressure is  176/17, with a left ventricular  end-diastolic pressure of 19 mmHg.  Aortic pressure is 175/75 with a mean pressure of 118 mmHg.  Left ventricular angiography: This showed normal LV systolic function  and wall motion with an estimated ejection fraction of 65%. No  significant mitral regurgitation is noted.  Coronary angiography:  1. Left main coronary artery: The vessel is normal in size with minor  irregularities but no evidence of obstructive disease.  2. Left circumflex artery: The vessel is very large in size and  dominant. It is mildly calcified in the proximal segment with  a  30% proximal stenosis. The rest of the circumflex has minor  irregularities. OM-1 is a very small-sized [place] Fedora. OM-2 is normal  in size with 30-40% ostial stenosis. OM-3 is normal in size with  mild diffuse 20% disease. The posterior AV groove is a large size  Xavian Hardcastle that gives PDA and posterolateral branches. All have mild  diffuse atherosclerosis but no evidence of obstructive disease.  3. Left anterior descending artery: The vessel is large in size and  wraps around the apex distally. A stent is noted proximally with  minimal in-stent restenosis. It appears that there is another  stent in the mid segment which is also patent with minimal  restenosis. In the mid LAD between the 2 segments, there seems to  be a severe 90% eccentric stenosis. The rest of the LAD has mild  atherosclerosis but no evidence of obstructive disease. Thediagonal branches are medium in size and free of significant  disease.  4. Right coronary artery: The vessel is small in size and  nondominant. It has mild diffuse atherosclerosis but no  obstructive disease.  STUDY CONCLUSIONS:  1. Severe single-vessel coronary artery disease with a 90% eccentric  stenosis in the mid left anterior descending artery.  2. Normal LV systolic function.  3. Recommend urgent angioplasty and stent placement to the left  anterior descending artery.     Assessment and Plan  1. CAD -  unclear cause of current chest pain, not typical of cardiac chest pain though he does have strong CAD history - reports recent stress test by pcp, will request results - increase norvasc to 10mg  daily, imdur in the past has caused headaches and will avoid - pending stress results, consider further medical management vs invasive testing. Pursue invasive testing if symptoms progress.   2. HTN - elevated in clinic however has not taken meds yet today, will follow  3. Hyperlipidemia - given hx of CAD, will change to high dose statin atorva 80mg  daily based on current lipid guidelines   F/u 1 month. Request recent stress test results.       Arnoldo Lenis, M.D., F.A.C.C.

## 2013-12-10 NOTE — Patient Instructions (Signed)
   Increase Norvasc to 10mg  daily   Stop Simvastatin  Change to Atorvastatin 80mg  daily  Above medications sent to pharmacy Follow up in  1 month

## 2013-12-18 ENCOUNTER — Telehealth: Payer: Self-pay | Admitting: Cardiology

## 2013-12-18 NOTE — Telephone Encounter (Signed)
Asking about an appointment needed to be made other than the one with Dr Harl Bowie? I requested his stress test and scanned in per office note Please contact wife

## 2013-12-19 NOTE — Telephone Encounter (Signed)
Left message to return call 

## 2013-12-24 NOTE — Telephone Encounter (Signed)
Discussed with patient as wife was not at home.  Reminded him of his follow up OV scheduled for 01/03/2014 with Dr. Harl Bowie.  Did not see any mention of him seeing anyone else per MD notes.  Did mention that he may need other testing pending review of stress test.  Advised him that MD will discuss all of that with him at upcoming visit.  Patient verbalized understanding.  Also, if wife has any further questions she can contact office.

## 2014-01-03 ENCOUNTER — Encounter: Payer: Self-pay | Admitting: Cardiology

## 2014-01-03 ENCOUNTER — Ambulatory Visit (INDEPENDENT_AMBULATORY_CARE_PROVIDER_SITE_OTHER): Payer: Medicare Other | Admitting: Cardiology

## 2014-01-03 VITALS — BP 148/70 | HR 86 | Ht 70.0 in | Wt 228.0 lb

## 2014-01-03 DIAGNOSIS — R0789 Other chest pain: Secondary | ICD-10-CM

## 2014-01-03 DIAGNOSIS — I2510 Atherosclerotic heart disease of native coronary artery without angina pectoris: Secondary | ICD-10-CM

## 2014-01-03 MED ORDER — RANOLAZINE ER 500 MG PO TB12: 500.0000 mg | ORAL_TABLET | Freq: Two times a day (BID) | ORAL | Status: DC

## 2014-01-03 NOTE — Progress Notes (Signed)
Clinical Summary Mr. Holderman is a 68 y.o.male seen today for follow up of the following medical problems. This is a focused visit on his history of CAD and recent chest pain.   1. CAD  - hx of BMS to LAD in 2003  - last cath 01/2011 with LM patent, LAD with prox stent mild ISR, patent mid stent, 90% mid LAD lesion. LCX 30% prox, OM2 30-40%, RCA small non-dom with no disease. Received additional stent to LAD at that time, a septal [place]ravious [place] was jailed. LVEF 65% by LV gram.  - imdur previously caused headaches, now off  - last visit described some episodes of chest pain, negative workup at [facility]. We increased his norvasc to 10mg  daily, and since that time pain is more mild and less frequent, but still occuring at times.   - since last visit obtained exercise cardiolite from 08/2013 from prior cardiologist. 6 minutes with no ischemic changes, 87%THR, LVEF 58%, moderate size mild intensity partially reversible inferior defect thought to be soft tissue attenuation but cannot rule out RCA ischemia     Past Medical History  Diagnosis Date  . Old myocardial infarction   . Type II or unspecified type diabetes mellitus without mention of complication, not stated as uncontrolled     TYPE 11  . Encounter for long-term (current) use of insulin   . Gout, unspecified   . Personal history of tobacco use, presenting hazards to health   . Diverticulitis   . Postsurgical percutaneous transluminal coronary angioplasty status   . Esophageal reflux   . Coronary atherosclerosis of native coronary artery  04/22/2001       Cardiac Catheterization  . Pure hypercholesterolemia   . Unspecified essential hypertension      No Known Allergies   Current Outpatient Prescriptions  Medication Sig Dispense Refill  . allopurinol (ZYLOPRIM) 300 MG tablet Take 300 mg by mouth daily.      Marland Kitchen amLODipine (NORVASC) 10 MG tablet Take 1 tablet (10 mg total) by mouth daily.  30 tablet  6  . aspirin 81 MG tablet Take  81 mg by mouth daily.        Marland Kitchen atorvastatin (LIPITOR) 80 MG tablet Take 1 tablet (80 mg total) by mouth daily.  30 tablet  6  . budesonide-formoterol (SYMBICORT) 160-4.5 MCG/ACT inhaler Inhale 2 puffs into the lungs 2 (two) times daily.      . carvedilol (COREG) 25 MG tablet Take 25 mg by mouth 2 (two) times daily with a meal.        . diclofenac (VOLTAREN) 75 MG EC tablet Take 75 mg by mouth daily.      . fexofenadine (ALLEGRA) 180 MG tablet Take 180 mg by mouth daily.        Marland Kitchen gabapentin (NEURONTIN) 300 MG capsule Take 300 mg by mouth daily.      Marland Kitchen glimepiride (AMARYL) 4 MG tablet Take 4 mg by mouth 2 (two) times daily.        Marland Kitchen HYDROcodone-acetaminophen (NORCO/VICODIN) 5-325 MG per tablet Take 1 tablet by mouth every 4 (four) hours as needed for moderate pain.      Marland Kitchen ipratropium (ATROVENT HFA) 17 MCG/ACT inhaler Inhale 2 puffs into the lungs as needed for wheezing.      . Liraglutide (VICTOZA) 18 MG/3ML SOPN Inject into the skin daily.      Marland Kitchen lisinopril (PRINIVIL,ZESTRIL) 20 MG tablet Take 20 mg by mouth daily.      . metFORMIN (  GLUCOPHAGE) 1000 MG tablet Take 1,000 mg by mouth 2 (two) times daily.        . Multiple Vitamins-Minerals (CENTRUM PO) Take by mouth daily.        Marland Kitchen Neomycin-Polymyxin (ANTIBIOTIC EX) Take by mouth daily.        . nitroGLYCERIN (NITROSTAT) 0.4 MG SL tablet Place 1 tablet (0.4 mg total) under the tongue every 5 (five) minutes as needed for chest pain.  25 tablet  3  . oxyCODONE-acetaminophen (PERCOCET/ROXICET) 5-325 MG per tablet Take 1 tablet by mouth every 4 (four) hours as needed for severe pain.      . pantoprazole (PROTONIX) 40 MG tablet Take 40 mg by mouth daily.        Marland Kitchen zolpidem (AMBIEN) 10 MG tablet Take 10 mg by mouth at bedtime as needed for sleep. Take 0.25 tablet at bedtime       No current facility-administered medications for this visit.     Past Surgical History  Procedure Laterality Date  . Hemorrhoid surgery    . Hernia repair    . Carotid  stents    . Amputation of left index finger  06/24/2008    Smashed in [place]   . Cardiac catheterization  2003    70% proximal LAD (plaque rupture), 40 % mid. Left dominent  . Coronary angioplasty with stent placement  2003    Prox LAD: 3.0 X12 BMS  . Cardiac catheterization  01/26/2011    LAD: Patent proximal stent. 90% calcified eccentic stenosis in med segment, mild LCX disease  . Coronary angioplasty with stent placement  01/26/2011    Mid LAD: 3.0 X15 mm Vision BMS. Complicated by a jailed septal Paizlee Kinder and periprocedural MI     No Known Allergies    Family History  Problem Relation Age of Onset  . Coronary artery disease      Family History     Social History Mr. Italiano reports that he quit smoking about 28 years ago. He does not have any smokeless tobacco history on file. Mr. Rybacki reports that he drinks alcohol.   Review of Systems CONSTITUTIONAL: No weight loss, fever, chills, weakness or fatigue.  HEENT: Eyes: No visual loss, blurred vision, double vision or yellow sclerae.No hearing loss, sneezing, congestion, runny nose or sore throat.  SKIN: No rash or itching.  CARDIOVASCULAR: per HPI RESPIRATORY: No shortness of breath, cough or sputum.  GASTROINTESTINAL: No anorexia, nausea, vomiting or diarrhea. No abdominal pain or blood.  GENITOURINARY: No burning on urination, no polyuria NEUROLOGICAL: No headache, dizziness, syncope, paralysis, ataxia, numbness or tingling in the extremities. No change in bowel or bladder control.  MUSCULOSKELETAL:Knee pain LYMPHATICS: No enlarged nodes. No history of splenectomy.  PSYCHIATRIC: No history of depression or anxiety.  ENDOCRINOLOGIC: No reports of sweating, cold or heat intolerance. No polyuria or polydipsia.  Marland Kitchen   Physical Examination p 86 bp 138/80 WT 228 lbs BMI 33 Gen: resting comfortably, no acute distress HEENT: no scleral icterus, pupils equal round and reactive, no palptable cervical adenopathy,  CV: RRR,  no m/r/g, no JVD, no carotid bruits Resp: Clear to auscultation bilaterally GI: abdomen is soft, non-tender, non-distended, normal bowel sounds, no hepatosplenomegaly MSK: extremities are warm, no edema.  Skin: warm, no rash Neuro:  no focal deficits Psych: appropriate affect   Diagnostic Studies Cath 01/2011  STUDY FINDINGS: Hemodynamic findings: Left ventricular pressure is  176/17, with a left ventricular end-diastolic pressure of 19 mmHg.  Aortic pressure is 175/75 with a  mean pressure of 118 mmHg.  Left ventricular angiography: This showed normal LV systolic function  and wall motion with an estimated ejection fraction of 65%. No  significant mitral regurgitation is noted.  Coronary angiography:  1. Left main coronary artery: The vessel is normal in size with minor  irregularities but no evidence of obstructive disease.  2. Left circumflex artery: The vessel is very large in size and  dominant. It is mildly calcified in the proximal segment with a  30% proximal stenosis. The rest of the circumflex has minor  irregularities. OM-1 is a very small-sized Solangel Mcmanaway. OM-2 is normal  in size with 30-40% ostial stenosis. OM-3 is normal in size with  mild diffuse 20% disease. The posterior AV groove is a large size  Symphoni Helbling that gives PDA and posterolateral branches. All have mild  diffuse atherosclerosis but no evidence of obstructive disease.  3. Left anterior descending artery: The vessel is large in size and  wraps around the apex distally. A stent is noted proximally with  minimal in-stent restenosis. It appears that there is another  stent in the mid segment which is also patent with minimal  restenosis. In the mid LAD between the 2 segments, there seems to  be a severe 90% eccentric stenosis. The rest of the LAD has mild  atherosclerosis but no evidence of obstructive disease. Thediagonal branches are medium in size and free of significant  disease.  4. Right coronary artery: The  vessel is small in size and  nondominant. It has mild diffuse atherosclerosis but no  obstructive disease.  STUDY CONCLUSIONS:  1. Severe single-vessel coronary artery disease with a 90% eccentric  stenosis in the mid left anterior descending artery.  2. Normal LV systolic function.  3. Recommend urgent angioplasty and stent placement to the left  anterior descending artery.      Assessment and Plan  1. CAD  - having some episodes of chest pain, based on description unclear if true angina - recent low risk exercise cardiolite, Duke treadmill score low risk at 6 and imaging with probable subdiaphragmatic attenuation but cannot completely rule out mild inferior ischemia - continue medical therapy, start ranexa 500mg  bid. If symptoms progress consider invasive testing.     F/u 4 months       Arnoldo Lenis, M.D

## 2014-01-03 NOTE — Patient Instructions (Addendum)
   Begin Ranexa 500mg  twice a day - new sent to pharm & #14 tabs samples also provided today  Continue all other medications.   Your physician wants you to follow up in:  4 months.  You will receive a reminder letter in the mail one-two months in advance.  If you don't receive a letter, please call our office to schedule the follow up appointment

## 2014-02-03 ENCOUNTER — Telehealth: Payer: Self-pay | Admitting: Cardiovascular Disease

## 2014-02-03 NOTE — Telephone Encounter (Signed)
Patient would like to talk about having cath done

## 2014-02-11 NOTE — Telephone Encounter (Signed)
No answer

## 2014-02-14 NOTE — Telephone Encounter (Signed)
Spoke with wife.  Stated that he had been hospitalized at [facility] about 2 weeks ago for bad sinus infection.  Was told by doctors there that he needed to f/u with his cardiologist to discuss having cath done.    OV scheduled for 03/06/2014 with Dr. Harl Bowie.

## 2014-03-06 ENCOUNTER — Ambulatory Visit: Payer: Medicare Other | Admitting: Cardiology

## 2014-05-05 ENCOUNTER — Ambulatory Visit: Payer: Medicare Other | Admitting: Cardiology

## 2014-05-05 NOTE — Progress Notes (Unsigned)
Clinical Summary Mr. Kienle is a 69 y.o.male seen today for follow up of the following medical problems.   1. CAD  - hx of BMS to LAD in 2003  - last cath 01/2011 with LM patent, LAD with prox stent mild ISR, patent mid stent, 90% mid LAD lesion. LCX 30% prox, OM2 30-40%, RCA small non-dom with no disease. Received additional stent to LAD at that time, a septal Makaila Windle was jailed. LVEF 65% by LV gram.  - imdur previously caused headaches, now off  - last visit described some episodes of chest pain, negative workup at [facility]. We increased his norvasc to 10mg  daily, and since that time pain is more mild and less frequent, but still occuring at times.   - since last visit obtained exercise cardiolite from 08/2013 from prior cardiologist. 6 minutes with no ischemic changes, 87%THR, LVEF 58%, moderate size mild intensity partially reversible inferior defect thought to be soft tissue attenuation but cannot rule out RCA ischemia    2. HTN - does not check regularly - compliant with meds, though has not taken yet today.  3. Hyperlipidemia - compliant with statin Past Medical History  Diagnosis Date  . Old myocardial infarction   . Type II or unspecified type diabetes mellitus without mention of complication, not stated as uncontrolled     TYPE 11  . Encounter for long-term (current) use of insulin   . Gout, unspecified   . Personal history of tobacco use, presenting hazards to health   . Diverticulitis   . Postsurgical percutaneous transluminal coronary angioplasty status   . Esophageal reflux   . Coronary atherosclerosis of native coronary artery  04/22/2001       Cardiac Catheterization  . Pure hypercholesterolemia   . Unspecified essential hypertension      No Known Allergies   Current Outpatient Prescriptions  Medication Sig Dispense Refill  . allopurinol (ZYLOPRIM) 300 MG tablet Take 300 mg by mouth daily.    Marland Kitchen amLODipine (NORVASC) 10 MG tablet Take 1 tablet (10  mg total) by mouth daily. 30 tablet 6  . aspirin 81 MG tablet Take 81 mg by mouth daily.      Marland Kitchen atorvastatin (LIPITOR) 80 MG tablet Take 1 tablet (80 mg total) by mouth daily. 30 tablet 6  . budesonide-formoterol (SYMBICORT) 160-4.5 MCG/ACT inhaler Inhale 2 puffs into the lungs 2 (two) times daily.    . carvedilol (COREG) 25 MG tablet Take 25 mg by mouth 2 (two) times daily with a meal.      . diclofenac (VOLTAREN) 75 MG EC tablet Take 75 mg by mouth daily.    . fexofenadine (ALLEGRA) 180 MG tablet Take 180 mg by mouth daily.      Marland Kitchen gabapentin (NEURONTIN) 300 MG capsule Take 300 mg by mouth daily.    Marland Kitchen glimepiride (AMARYL) 4 MG tablet Take 4 mg by mouth 2 (two) times daily.      Marland Kitchen HYDROcodone-acetaminophen (NORCO/VICODIN) 5-325 MG per tablet Take 1 tablet by mouth every 4 (four) hours as needed for moderate pain.    Marland Kitchen ipratropium (ATROVENT HFA) 17 MCG/ACT inhaler Inhale 2 puffs into the lungs as needed for wheezing.    . Liraglutide (VICTOZA) 18 MG/3ML SOPN Inject into the skin daily.    Marland Kitchen lisinopril (PRINIVIL,ZESTRIL) 20 MG tablet Take 20 mg by mouth daily.    . metFORMIN (GLUCOPHAGE) 1000 MG tablet Take 1,000 mg by mouth 2 (two) times daily.      Marland Kitchen  Multiple Vitamins-Minerals (CENTRUM PO) Take by mouth daily.      Marland Kitchen Neomycin-Polymyxin (ANTIBIOTIC EX) Take by mouth daily.      . nitroGLYCERIN (NITROSTAT) 0.4 MG SL tablet Place 1 tablet (0.4 mg total) under the tongue every 5 (five) minutes as needed for chest pain. 25 tablet 3  . oxyCODONE-acetaminophen (PERCOCET/ROXICET) 5-325 MG per tablet Take 1 tablet by mouth every 4 (four) hours as needed for severe pain.    . pantoprazole (PROTONIX) 40 MG tablet Take 40 mg by mouth daily.      . ranolazine (RANEXA) 500 MG 12 hr tablet Take 1 tablet (500 mg total) by mouth 2 (two) times daily. 60 tablet 6  . zolpidem (AMBIEN) 10 MG tablet Take 10 mg by mouth at bedtime as needed for sleep. Take 0.25 tablet at bedtime     No current facility-administered  medications for this visit.     Past Surgical History  Procedure Laterality Date  . Hemorrhoid surgery    . Hernia repair    . Carotid stents    . 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 Cardiac catheterization  2003    70% proximal LAD (plaque rupture), 40 % mid. Left dominent  . Coronary angioplasty with stent placement  2003    Prox LAD: 3.0 X12 BMS  . Cardiac catheterization  01/26/2011    LAD: Patent proximal stent. 90% calcified eccentic stenosis in med segment, mild LCX disease  . Coronary angioplasty with stent placement  01/26/2011    Mid LAD: 3.0 X15 mm Vision BMS. Complicated by a jailed septal Masaru Chamberlin and periprocedural MI     No Known Allergies    Family History  Problem Relation Age of Onset  . Coronary artery disease      Family History     Social History Mr. Ake reports that he quit smoking about 29 years ago. He does not have any smokeless tobacco history on file. Mr. Stroder reports that he drinks alcohol.   Review of Systems CONSTITUTIONAL: No weight loss, fever, chills, weakness or fatigue.  HEENT: Eyes: No visual loss, blurred vision, double vision or yellow sclerae.No hearing loss, sneezing, congestion, runny nose or sore throat.  SKIN: No rash or itching.  CARDIOVASCULAR:  RESPIRATORY: No shortness of breath, cough or sputum.  GASTROINTESTINAL: No anorexia, nausea, vomiting or diarrhea. No abdominal pain or blood.  GENITOURINARY: No burning on urination, no polyuria NEUROLOGICAL: No headache, dizziness, syncope, paralysis, ataxia, numbness or tingling in the extremities. No change in bowel or bladder control.  MUSCULOSKELETAL: No muscle, back pain, joint pain or stiffness.  LYMPHATICS: No enlarged nodes. No history of splenectomy.  PSYCHIATRIC: No history of depression or anxiety.  ENDOCRINOLOGIC: No reports of sweating, cold or heat intolerance. No polyuria or polydipsia.  Marland Kitchen   Physical Examination There were  no vitals filed for this visit. There were no vitals filed for this visit.  Gen: resting comfortably, no acute distress HEENT: no scleral icterus, pupils equal round and reactive, no palptable cervical adenopathy,  CV Resp: Clear to auscultation bilaterally GI: abdomen is soft, non-tender, non-distended, normal bowel sounds, no hepatosplenomegaly MSK: extremities are warm, no edema.  Skin: warm, no rash Neuro:  no focal deficits Psych: appropriate affect   Diagnostic Studies Cath 01/2011  STUDY FINDINGS: Hemodynamic findings: Left ventricular pressure is  176/17, with a left ventricular end-diastolic pressure of 19 mmHg.  Aortic pressure is 175/75 with a mean pressure  of 118 mmHg.  Left ventricular angiography: This showed normal LV systolic function  and wall motion with an estimated ejection fraction of 65%. No  significant mitral regurgitation is noted.  Coronary angiography:  1. Left main coronary artery: The vessel is normal in size with minor  irregularities but no evidence of obstructive disease.  2. Left circumflex artery: The vessel is very large in size and  dominant. It is mildly calcified in the proximal segment with a  30% proximal stenosis. The rest of the circumflex has minor  irregularities. OM-1 is a very small-sized Ehan Freas. OM-2 is normal  in size with 30-40% ostial stenosis. OM-3 is normal in size with  mild diffuse 20% disease. The posterior AV groove is a large size  Dong Nimmons that gives PDA and posterolateral branches. All have mild  diffuse atherosclerosis but no evidence of obstructive disease.  3. Left anterior descending artery: The vessel is large in size and  wraps around the apex distally. A stent is noted proximally with  minimal in-stent restenosis. It appears that there is another  stent in the mid segment which is also patent with minimal  restenosis. In the mid LAD between the 2 segments, there seems to  be a severe 90%  eccentric stenosis. The rest of the LAD has mild  atherosclerosis but no evidence of obstructive disease. Thediagonal branches are medium in size and free of significant  disease.  4. Right coronary artery: The vessel is small in size and  nondominant. It has mild diffuse atherosclerosis but no  obstructive disease.  STUDY CONCLUSIONS:  1. Severe single-vessel coronary artery disease with a 90% eccentric  stenosis in the mid left anterior descending artery.  2. Normal LV systolic function.  3. Recommend urgent angioplasty and stent placement to the left  anterior descending artery.   08/2013 Exercise Cardiolite  exercise cardiolite from 08/2013 from prior cardiologist. 6 minutes with no ischemic changes, 87%THR, LVEF 58%, moderate size mild intensity partially reversible inferior defect thought to be soft tissue attenuation but cannot rule out RCA ischemia      Assessment and Plan  1. CAD  - having some episodes of chest pain, based on description unclear if true angina - recent low risk exercise cardiolite, Duke treadmill score low risk at 6 and imaging with probable subdiaphragmatic attenuation but cannot completely rule out mild inferior ischemia - continue medical therapy, start ranexa 500mg  bid. If symptoms progress consider invasive testing.   2. HTN - elevated in clinic however has not taken meds yet today, will follow  3. Hyperlipidemia - given hx of CAD, will change to high dose statin atorva 80mg  daily based on current lipid guidelines    Arnoldo Lenis, M.D., F.A.C.C.

## 2014-05-06 ENCOUNTER — Telehealth: Payer: Self-pay | Admitting: *Deleted

## 2014-05-06 NOTE — Telephone Encounter (Signed)
Pt wife called pt having chest pain X 2days has taken 3 nitroglycerin today. Pt refusing to report to ED wanted to be seen here. Advised wife to take pt to the nearest ED for evaluation. Pt refusing go to ED. Will forward to Dr. Harl Bowie as [name]. Will f/u with pt today.

## 2014-05-06 NOTE — Telephone Encounter (Signed)
Spoke with wife Mardene Celeste), in route with pt to [facility] ED

## 2014-05-08 NOTE — Telephone Encounter (Signed)
Please follow up with patient for updates on his ER visit   Zandra Abts MD

## 2014-05-08 NOTE — Telephone Encounter (Signed)
Pt admitted to [facility]. Have H&P EKG's. Pt is currently waiting for LEXI.

## 2014-05-19 ENCOUNTER — Encounter: Payer: Self-pay | Admitting: *Deleted

## 2014-05-19 ENCOUNTER — Encounter: Payer: Self-pay | Admitting: Cardiology

## 2014-05-19 ENCOUNTER — Ambulatory Visit (INDEPENDENT_AMBULATORY_CARE_PROVIDER_SITE_OTHER): Payer: Medicare Other | Admitting: Cardiology

## 2014-05-19 VITALS — BP 131/78 | HR 76 | Ht 70.0 in | Wt 223.8 lb

## 2014-05-19 DIAGNOSIS — I2510 Atherosclerotic heart disease of native coronary artery without angina pectoris: Secondary | ICD-10-CM

## 2014-05-19 DIAGNOSIS — R0789 Other chest pain: Secondary | ICD-10-CM

## 2014-05-19 MED ORDER — RANOLAZINE ER 1000 MG PO TB12: 1000.0000 mg | ORAL_TABLET | Freq: Two times a day (BID) | ORAL | Status: DC

## 2014-05-19 NOTE — Patient Instructions (Signed)
Your physician recommends that you schedule a follow-up appointment in: 4-6 weeks with Dr. Harl Bowie  Your physician has recommended you make the following change in your medication:   INCREASE RANEXA Fellows AS DIRECTED  Thank you for choosing [facility]!!

## 2014-05-19 NOTE — Progress Notes (Signed)
Patient ID: [name], male   DOB: 1945-06-17, 69 y.o.   MRN: [id number]     Clinical Summary Mr. Burdette is a 69 y.o.male seen today for follow up of the following medical problems. This is a focused posthospital follow up on CAD and chest pain.   1. CAD  - hx of BMS to LAD in 2003  - last cath 01/2011 with LM patent, LAD with prox stent mild ISR, patent mid stent, 90% mid LAD lesion. LCX 30% prox, OM2 30-40%, RCA small non-dom with no disease. Received additional stent to LAD at that time, a septal Yesly Gerety was jailed. LVEF 65% by LV gram.  - imdur previously caused headaches, now off  - exercise cardiolite from 08/2013 from prior cardiologist. 6 minutes with no ischemic changes, 87%THR, LVEF 58%, moderate size mild intensity partially reversible inferior defect thought to be soft tissue attenuation but cannot rule out RCA ischemia  -[name] admit with chest pain 04/2014. Sharp pain mid to left chest, 10/10. No other associated symptoms. Not positional. Better with NG. Symptoms would last just a few minutes at at time.  Lexiscan MPI during admission with no ischemia, LVEF 60%.     Past Medical History  Diagnosis Date  . Old myocardial infarction   . Type II or unspecified type diabetes mellitus without mention of complication, not stated as uncontrolled     TYPE 11  . Encounter for long-term (current) use of insulin   . Gout, unspecified   . Personal history of tobacco use, presenting hazards to health   . Diverticulitis   . Postsurgical percutaneous transluminal coronary angioplasty status   . Esophageal reflux   . Coronary atherosclerosis of native coronary artery  04/22/2001       Cardiac Catheterization  . Pure hypercholesterolemia   . Unspecified essential hypertension      No Known Allergies   Current Outpatient Prescriptions  Medication Sig Dispense Refill  . allopurinol (ZYLOPRIM) 300 MG tablet Take 300 mg by mouth daily.    Marland Kitchen amLODipine (NORVASC) 10 MG  tablet Take 1 tablet (10 mg total) by mouth daily. 30 tablet 6  . aspirin 81 MG tablet Take 81 mg by mouth daily.      Marland Kitchen atorvastatin (LIPITOR) 80 MG tablet Take 1 tablet (80 mg total) by mouth daily. 30 tablet 6  . budesonide-formoterol (SYMBICORT) 160-4.5 MCG/ACT inhaler Inhale 2 puffs into the lungs 2 (two) times daily.    . carvedilol (COREG) 25 MG tablet Take 25 mg by mouth 2 (two) times daily with a meal.      . diclofenac (VOLTAREN) 75 MG EC tablet Take 75 mg by mouth daily.    . fexofenadine (ALLEGRA) 180 MG tablet Take 180 mg by mouth daily.      Marland Kitchen gabapentin (NEURONTIN) 300 MG capsule Take 300 mg by mouth daily.    Marland Kitchen glimepiride (AMARYL) 4 MG tablet Take 4 mg by mouth 2 (two) times daily.      Marland Kitchen HYDROcodone-acetaminophen (NORCO/VICODIN) 5-325 MG per tablet Take 1 tablet by mouth every 4 (four) hours as needed for moderate pain.    Marland Kitchen ipratropium (ATROVENT HFA) 17 MCG/ACT inhaler Inhale 2 puffs into the lungs as needed for wheezing.    . Liraglutide (VICTOZA) 18 MG/3ML SOPN Inject into the skin daily.    Marland Kitchen lisinopril (PRINIVIL,ZESTRIL) 20 MG tablet Take 20 mg by mouth daily.    . metFORMIN (GLUCOPHAGE) 1000 MG tablet Take 1,000 mg by mouth 2 (two) times  daily.      . Multiple Vitamins-Minerals (CENTRUM PO) Take by mouth daily.      Marland Kitchen Neomycin-Polymyxin (ANTIBIOTIC EX) Take by mouth daily.      . nitroGLYCERIN (NITROSTAT) 0.4 MG SL tablet Place 1 tablet (0.4 mg total) under the tongue every 5 (five) minutes as needed for chest pain. 25 tablet 3  . oxyCODONE-acetaminophen (PERCOCET/ROXICET) 5-325 MG per tablet Take 1 tablet by mouth every 4 (four) hours as needed for severe pain.    . pantoprazole (PROTONIX) 40 MG tablet Take 40 mg by mouth daily.      . ranolazine (RANEXA) 500 MG 12 hr tablet Take 1 tablet (500 mg total) by mouth 2 (two) times daily. 60 tablet 6  . zolpidem (AMBIEN) 10 MG tablet Take 10 mg by mouth at bedtime as needed for sleep. Take 0.25 tablet at bedtime     No  current facility-administered medications for this visit.     Past Surgical History  Procedure Laterality Date  . Hemorrhoid surgery    . Hernia repair    . Carotid stents    . 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   . Cardiac catheterization  2003    70% proximal LAD (plaque rupture), 40 % mid. Left dominent  . Coronary angioplasty with stent placement  2003    Prox LAD: 3.0 X12 BMS  . Cardiac catheterization  01/26/2011    LAD: Patent proximal stent. 90% calcified eccentic stenosis in med segment, mild LCX disease  . Coronary angioplasty with stent placement  01/26/2011    Mid LAD: 3.0 X15 mm Vision BMS. Complicated by a jailed septal Ariella Voit and periprocedural MI     No Known Allergies    Family History  Problem Relation Age of Onset  . Coronary artery disease      Family History     Social History Mr. Bingley reports that he quit smoking about 29 years ago. He does not have any smokeless tobacco history on file. Mr. Fidalgo reports that he drinks alcohol.   Review of Systems CONSTITUTIONAL: No weight loss, fever, chills, weakness or fatigue.  HEENT: Eyes: No visual loss, blurred vision, double vision or yellow sclerae.No hearing loss, sneezing, congestion, runny nose or sore throat.  SKIN: No rash or itching.  CARDIOVASCULAR: per HPI RESPIRATORY: No shortness of breath, cough or sputum.  GASTROINTESTINAL: No anorexia, nausea, vomiting or diarrhea. No abdominal pain or blood.  GENITOURINARY: No burning on urination, no polyuria NEUROLOGICAL: No headache, dizziness, syncope, paralysis, ataxia, numbness or tingling in the extremities. No change in bowel or bladder control.  MUSCULOSKELETAL: No muscle, back pain, joint pain or stiffness.  LYMPHATICS: No enlarged nodes. No history of splenectomy.  PSYCHIATRIC: No history of depression or anxiety.  ENDOCRINOLOGIC: No reports of sweating, cold or heat intolerance. No polyuria or polydipsia.   Marland Kitchen   Physical Examination p 76 bp 131/78 Wt 223 lbs BMI 32 Gen: resting comfortably, no acute distress HEENT: no scleral icterus, pupils equal round and reactive, no palptable cervical adenopathy,  CV: RRR, no m/r/g,no JVD Resp: Clear to auscultation bilaterally GI: abdomen is soft, non-tender, non-distended, normal bowel sounds, no hepatosplenomegaly MSK: extremities are warm, no edema.  Skin: warm, no rash Neuro:  no focal deficits Psych: appropriate affect   Diagnostic Studies Cath 01/2011  STUDY FINDINGS: Hemodynamic findings: Left ventricular pressure is  176/17, with a left ventricular end-diastolic pressure of 19 mmHg.  Aortic pressure is 175/75  with a mean pressure of 118 mmHg.  Left ventricular angiography: This showed normal LV systolic function  and wall motion with an estimated ejection fraction of 65%. No  significant mitral regurgitation is noted.  Coronary angiography:  1. Left main coronary artery: The vessel is normal in size with minor  irregularities but no evidence of obstructive disease.  2. Left circumflex artery: The vessel is very large in size and  dominant. It is mildly calcified in the proximal segment with a  30% proximal stenosis. The rest of the circumflex has minor  irregularities. OM-1 is a very small-sized Tezra Mahr. OM-2 is normal  in size with 30-40% ostial stenosis. OM-3 is normal in size with  mild diffuse 20% disease. The posterior AV groove is a large size  Aveen Stansel that gives PDA and posterolateral branches. All have mild  diffuse atherosclerosis but no evidence of obstructive disease.  3. Left anterior descending artery: The vessel is large in size and  wraps around the apex distally. A stent is noted proximally with  minimal in-stent restenosis. It appears that there is another  stent in the mid segment which is also patent with minimal  restenosis. In the mid LAD between the 2 segments, there seems to  be a severe  90% eccentric stenosis. The rest of the LAD has mild  atherosclerosis but no evidence of obstructive disease. Thediagonal branches are medium in size and free of significant  disease.  4. Right coronary artery: The vessel is small in size and  nondominant. It has mild diffuse atherosclerosis but no  obstructive disease.  STUDY CONCLUSIONS:  1. Severe single-vessel coronary artery disease with a 90% eccentric  stenosis in the mid left anterior descending artery.  2. Normal LV systolic function.  3. Recommend urgent angioplasty and stent placement to the left  anterior descending artery.   08/2013 Exercise Cardiolite exercise cardiolite from 08/2013 from prior cardiologist. 6 minutes with no ischemic changes, 87%THR, LVEF 58%, moderate size mild intensity partially reversible inferior defect thought to be soft tissue attenuation but cannot rule out RCA ischemia     Assessment and Plan  1. CAD  - recent admit to [facility] with chestpain, Lexiscan MPI without ischemia. Prior exercise cardiolite 08/2013 Duke score of 6 with no ischemic changes, low risk study with ? Inferior defect vs artifact - 2 recent low risk stress tests, continue medical therapy at this time. Will increase ranexa to 1000 mg bid.   - having some episodes of chest pain, based on description unclear if true angina - recent low risk exercise cardiolite, Duke treadmill score low risk at 6 and imaging with probable subdiaphragmatic attenuation but cannot completely rule out mild inferior ischemia - continue medical therapy, start ranexa 500mg  bid. If symptoms progress consider invasive testing.    F/u 6 weeks     Arnoldo Lenis, M.D.

## 2014-05-28 ENCOUNTER — Telehealth: Payer: Self-pay | Admitting: *Deleted

## 2014-05-28 NOTE — Telephone Encounter (Signed)
Pt wife calling about upcoming trip out of town, pt wants to know if this is ok with recent increase of Ranexa. Will forward to Dr. Harl Bowie

## 2014-07-07 ENCOUNTER — Encounter: Payer: Self-pay | Admitting: Cardiology

## 2014-07-07 ENCOUNTER — Ambulatory Visit (INDEPENDENT_AMBULATORY_CARE_PROVIDER_SITE_OTHER): Payer: Medicare Other | Admitting: Cardiology

## 2014-07-07 VITALS — BP 154/81 | HR 80 | Ht 70.0 in | Wt 224.0 lb

## 2014-07-07 DIAGNOSIS — R0789 Other chest pain: Secondary | ICD-10-CM | POA: Diagnosis not present

## 2014-07-07 DIAGNOSIS — I2510 Atherosclerotic heart disease of native coronary artery without angina pectoris: Secondary | ICD-10-CM

## 2014-07-07 NOTE — Progress Notes (Signed)
Clinical Summary [name] is a 69 y.o.male seen today for follow up of the following medical problems. This is a focused visit on his history of CAD, chest pain, and recent admission to [facility]   1. CAD  - hx of BMS to LAD in 2003  - lcath 01/2011 with LM patent, LAD with prox stent mild ISR, patent mid stent, 90% mid LAD lesion. LCX 30% prox, OM2 30-40%, RCA small non-dom with no disease. Received additional stent to LAD at that time, a septal [name] was jailed. LVEF 65% by LV gram.  - exercise cardiolite from 08/2013 from prior cardiologist. 6 minutes with no ischemic changes, 87%THR, LVEF 58%, moderate size mild intensity partially reversible inferior defect thought to be soft tissue attenuation but cannot rule out RCA ischemia -[name] admit with chest pain 04/2014. Sharp pain mid to left chest, 10/10. No other associated symptoms. Not positional. Better with NG. Symptoms would last just a few minutes at at time. Lexiscan MPI during admission with no ischemia, LVEF 60%. - nitrates have caused headaches, ranexa started last visit  - since last visit admitted to [name] with chest pain. Medical records reviewed in detail. Cath report as indicated below, overall non-obstructive disease. His ranexa was discontinued at discharge, thought to be MSK pain.        Past Medical History  Diagnosis Date  . Old myocardial infarction   . Type II or unspecified type diabetes mellitus without mention of complication, not stated as uncontrolled     TYPE 11  . Encounter for long-term (current) use of insulin   . Gout, unspecified   . Personal history of tobacco use, presenting hazards to health   . Diverticulitis   . Postsurgical percutaneous transluminal coronary angioplasty status   . Esophageal reflux   . Coronary atherosclerosis of native coronary artery  04/22/2001       Cardiac Catheterization  . Pure hypercholesterolemia   . Unspecified essential hypertension       No Known Allergies   Current Outpatient Prescriptions  Medication Sig Dispense Refill  . allopurinol (ZYLOPRIM) 300 MG tablet Take 300 mg by mouth daily.    Marland Kitchen amLODipine (NORVASC) 10 MG tablet Take 1 tablet (10 mg total) by mouth daily. 30 tablet 6  . aspirin 81 MG tablet Take 81 mg by mouth daily.      Marland Kitchen atorvastatin (LIPITOR) 80 MG tablet Take 1 tablet (80 mg total) by mouth daily. 30 tablet 6  . budesonide-formoterol (SYMBICORT) 160-4.5 MCG/ACT inhaler Inhale 2 puffs into the lungs 2 (two) times daily.    . carvedilol (COREG) 25 MG tablet Take 25 mg by mouth 2 (two) times daily with a meal.      . diclofenac (VOLTAREN) 75 MG EC tablet Take 75 mg by mouth daily.    . fexofenadine (ALLEGRA) 180 MG tablet Take 180 mg by mouth daily.      Marland Kitchen gabapentin (NEURONTIN) 300 MG capsule Take 300 mg by mouth daily.    Marland Kitchen glimepiride (AMARYL) 4 MG tablet Take 4 mg by mouth 2 (two) times daily.      Marland Kitchen HYDROcodone-acetaminophen (NORCO/VICODIN) 5-325 MG per tablet Take 1 tablet by mouth every 4 (four) hours as needed for moderate pain.    Marland Kitchen ipratropium (ATROVENT HFA) 17 MCG/ACT inhaler Inhale 2 puffs into the lungs as needed for wheezing.    . Liraglutide (VICTOZA) 18 MG/3ML SOPN Inject into the skin daily.    Marland Kitchen lisinopril (PRINIVIL,ZESTRIL)  20 MG tablet Take 20 mg by mouth daily.    . metFORMIN (GLUCOPHAGE) 1000 MG tablet Take 1,000 mg by mouth 2 (two) times daily.      . Multiple Vitamins-Minerals (CENTRUM PO) Take by mouth daily.      Marland Kitchen Neomycin-Polymyxin (ANTIBIOTIC EX) Take by mouth daily.      . nitroGLYCERIN (NITROSTAT) 0.4 MG SL tablet Place 1 tablet (0.4 mg total) under the tongue every 5 (five) minutes as needed for chest pain. 25 tablet 3  . oxyCODONE-acetaminophen (PERCOCET/ROXICET) 5-325 MG per tablet Take 1 tablet by mouth every 4 (four) hours as needed for severe pain.    . pantoprazole (PROTONIX) 40 MG tablet Take 40 mg by mouth daily.      . ranolazine (RANEXA) 1000 MG SR tablet  Take 1 tablet (1,000 mg total) by mouth 2 (two) times daily. 60 tablet 3  . zolpidem (AMBIEN) 10 MG tablet Take 10 mg by mouth at bedtime as needed for sleep. Take 0.25 tablet at bedtime     No current facility-administered medications for this visit.     Past Surgical History  Procedure Laterality Date  . Hemorrhoid surgery    . Hernia repair    . Carotid stents    . 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 Cardiac catheterization  2003    70% proximal LAD (plaque rupture), 40 % mid. Left dominent  . Coronary angioplasty with stent placement  2003    Prox LAD: 3.0 X12 BMS  . Cardiac catheterization  01/26/2011    LAD: Patent proximal stent. 90% calcified eccentic stenosis in med segment, mild LCX disease  . Coronary angioplasty with stent placement  01/26/2011    Mid LAD: 3.0 X15 mm Vision BMS. Complicated by a jailed septal [name] and periprocedural MI     No Known Allergies    Family History  Problem Relation Age of Onset  . Coronary artery disease      Family History     Social History [name] reports that he quit smoking about 29 years ago. He does not have any smokeless tobacco history on file. [name] reports that he drinks alcohol.   Review of Systems CONSTITUTIONAL: No weight loss, fever, chills, weakness or fatigue.  HEENT: Eyes: No visual loss, blurred vision, double vision or yellow sclerae.No hearing loss, sneezing, congestion, runny nose or sore throat.  SKIN: No rash or itching.  CARDIOVASCULAR: per HPI RESPIRATORY: No shortness of breath, cough or sputum.  GASTROINTESTINAL: No anorexia, nausea, vomiting or diarrhea. No abdominal pain or blood.  GENITOURINARY: No burning on urination, no polyuria NEUROLOGICAL: No headache, dizziness, syncope, paralysis, ataxia, numbness or tingling in the extremities. No change in bowel or bladder control.  MUSCULOSKELETAL: No muscle, back pain, joint pain or stiffness.  LYMPHATICS:  No enlarged nodes. No history of splenectomy.  PSYCHIATRIC: No history of depression or anxiety.  ENDOCRINOLOGIC: No reports of sweating, cold or heat intolerance. No polyuria or polydipsia.  Marland Kitchen   Physical Examination p 80 bp 154/81 Wt 224 lbs BMI 32 Gen: resting comfortably, no acute distress HEENT: no scleral icterus, pupils equal round and reactive, no palptable cervical adenopathy,  CV: RRR, no m/r/g, no JVD, no carotid bruits Resp: Clear to auscultation bilaterally GI: abdomen is soft, non-tender, non-distended, normal bowel sounds, no hepatosplenomegaly MSK: extremities are warm, no edema.  Skin: warm, no rash Neuro:  no focal deficits Psych: appropriate affect  Diagnostic Studies  Cath 01/2011  STUDY FINDINGS: Hemodynamic findings: Left ventricular pressure is  176/17, with a left ventricular end-diastolic pressure of 19 mmHg.  Aortic pressure is 175/75 with a mean pressure of 118 mmHg.  Left ventricular angiography: This showed normal LV systolic function  and wall motion with an estimated ejection fraction of 65%. No  significant mitral regurgitation is noted.  Coronary angiography:  1. Left main coronary artery: The vessel is normal in size with minor  irregularities but no evidence of obstructive disease.  2. Left circumflex artery: The vessel is very large in size and  dominant. It is mildly calcified in the proximal segment with a  30% proximal stenosis. The rest of the circumflex has minor  irregularities. OM-1 is a very small-sized Toney Difatta. OM-2 is normal  in size with 30-40% ostial stenosis. OM-3 is normal in size with  mild diffuse 20% disease. The posterior AV groove is a large size  Jamilet Ambroise that gives PDA and posterolateral branches. All have mild  diffuse atherosclerosis but no evidence of obstructive disease.  3. Left anterior descending artery: The vessel is large in size and  wraps around the apex distally. A stent is noted proximally  with  minimal in-stent restenosis. It appears that there is another  stent in the mid segment which is also patent with minimal  restenosis. In the mid LAD between the 2 segments, there seems to  be a severe 90% eccentric stenosis. The rest of the LAD has mild  atherosclerosis but no evidence of obstructive disease. Thediagonal branches are medium in size and free of significant  disease.  4. Right coronary artery: The vessel is small in size and  nondominant. It has mild diffuse atherosclerosis but no  obstructive disease.  STUDY CONCLUSIONS:  1. Severe single-vessel coronary artery disease with a 90% eccentric  stenosis in the mid left anterior descending artery.  2. Normal LV systolic function.  3. Recommend urgent angioplasty and stent placement to the left  anterior descending artery.   08/2013 Exercise Cardiolite exercise cardiolite from 08/2013 from prior cardiologist. 6 minutes with no ischemic changes, 87%THR, LVEF 58%, moderate size mild intensity partially reversible inferior defect thought to be soft tissue attenuation but cannot rule out RCA ischemia   05/2014 Echo [facility] ECHOCARDIOGRAM: 05/30/2014 NORMAL LEFT VENTRICULAR SYSTOLIC FUNCTION WITH MODERATE LVH NORMAL LA PRESSURES WITH DIASTOLIC DYSFUNCTION NORMAL RIGHT VENTRICULAR SYSTOLIC FUNCTION NO VALVULAR REGURGITATION NO VALVULAR STENOSIS   05/2014 Cath Cardiac Catheterization: 05/30/2014 Diagnostic Findings Coronary arteries Dominance: left Left main: insignificant LAD: insignificant LCx: insignificant RCA: not injected Left Ventricle Catheterization EF: 55% Wall Motion: Anterior: Normal Apical: Normal Inferior: Normal Septal: Normal Lateral: Normal Impressions 1. Right radial 2. LAD wih mild - moderate atherosclerosis - 40% in-stent 3. LCX dominant with 20-30% 4. RCA not injected Recommendations 1. No significant CAD 2. LV normal   Assessment and Plan   1. CAD  - long history of  intermittent chest pain. Multiple negative stress tests, recent admit 05/2014 at [facility] with cath that showed non-obstructive CAD, likely MSK pain - continue risk factor modification and secondary prevention. Workup/manamgent for noncardiac chest pain per pcp     F/u 6 months  Arnoldo Lenis, M.D.

## 2014-07-07 NOTE — Patient Instructions (Signed)
   Stop Ranexa  Continue all other current medications.  Your physician wants you to follow up in: 6 months.  You will receive a reminder letter in the mail one-two months in advance.  If you don't receive a letter, please call our office to schedule the follow up appointment

## 2014-08-08 ENCOUNTER — Other Ambulatory Visit: Payer: Self-pay | Admitting: Cardiology

## 2015-01-23 ENCOUNTER — Ambulatory Visit (INDEPENDENT_AMBULATORY_CARE_PROVIDER_SITE_OTHER): Payer: Medicare Other | Admitting: Cardiology

## 2015-01-23 ENCOUNTER — Encounter: Payer: Self-pay | Admitting: *Deleted

## 2015-01-23 ENCOUNTER — Encounter: Payer: Self-pay | Admitting: Cardiology

## 2015-01-23 VITALS — BP 132/77 | HR 78 | Ht 70.0 in | Wt 231.8 lb

## 2015-01-23 DIAGNOSIS — R0789 Other chest pain: Secondary | ICD-10-CM | POA: Diagnosis not present

## 2015-01-23 DIAGNOSIS — I10 Essential (primary) hypertension: Secondary | ICD-10-CM

## 2015-01-23 DIAGNOSIS — I2510 Atherosclerotic heart disease of native coronary artery without angina pectoris: Secondary | ICD-10-CM

## 2015-01-23 MED ORDER — FUROSEMIDE 20 MG PO TABS: ORAL_TABLET | ORAL | Status: DC

## 2015-01-23 MED ORDER — LISINOPRIL 40 MG PO TABS: 40.0000 mg | ORAL_TABLET | Freq: Every day | ORAL | Status: DC

## 2015-01-23 NOTE — Patient Instructions (Signed)
Your physician wants you to follow-up in: [place] DR. BRANCH You will receive a reminder letter in the mail two months in advance. If you don't receive a letter, please call our office to schedule the follow-up appointment.  Your physician has recommended you make the following change in your medication:   INCREASE LISINOPRIL 40 MG DAILY  START LASIX 20 MG DAILY AS NEEDED FOR SWELLING  Your physician recommends that you return for lab work in: 2 WEEKS BMP  Thank you for choosing [facility]!!

## 2015-01-23 NOTE — Progress Notes (Signed)
Patient ID: [name], male   DOB: 1945-04-25, 69 y.o.   MRN: [id number]     Clinical Summary [name] is a 69 y.o.male seen today for follow up of the following medical problems.   1. CAD  - hx of BMS to LAD in 2003  - cath 01/2011 with LM patent, LAD with prox stent mild ISR, patent mid stent, 90% mid LAD lesion. LCX 30% prox, OM2 30-40%, RCA small non-dom with no disease. Received additional stent to LAD at that time, a septal [name] was jailed. LVEF 65% by LV gram.  - exercise cardiolite from 08/2013 from prior cardiologist. 6 minutes with no ischemic changes, 87%THR, LVEF 58%, moderate size mild intensity partially reversible inferior defect thought to be soft tissue attenuation but cannot rule out RCA ischemia -[name] admit with chest pain 04/2014. Sharp pain mid to left chest, 10/10. No other associated symptoms. Not positional. Better with NG. Symptoms would last just a few minutes at at time. Lexiscan MPI during admission with no ischemia, LVEF 60%. 05/2014 cath at [facility] patent coronaries  - denies any chest pain since last visit. Compliant with meds  2. HTN - checks at home 1-2 times a week. Typically around 140s/70s  3. HL - compliant with lipitor  Past Medical History  Diagnosis Date  . Old myocardial infarction   . Type II or unspecified type diabetes mellitus without mention of complication, not stated as uncontrolled     TYPE 11  . Encounter for long-term (current) use of insulin   . Gout, unspecified   . Personal history of tobacco use, presenting hazards to health   . Diverticulitis   . Postsurgical percutaneous transluminal coronary angioplasty status   . Esophageal reflux   . Coronary atherosclerosis of native coronary artery  04/22/2001       Cardiac Catheterization  . Pure hypercholesterolemia   . Unspecified essential hypertension      No Known Allergies   Current Outpatient Prescriptions  Medication Sig Dispense Refill  . allopurinol  (ZYLOPRIM) 300 MG tablet Take 300 mg by mouth daily.    Marland Kitchen amLODipine (NORVASC) 10 MG tablet Take 1 tablet (10 mg total) by mouth daily. 30 tablet 6  . aspirin 81 MG tablet Take 81 mg by mouth daily.      Marland Kitchen atorvastatin (LIPITOR) 80 MG tablet TAKE 1 TABLET (80 MG TOTAL) BY MOUTH DAILY. (STOP SIMVASTATIN) 30 tablet 6  . budesonide-formoterol (SYMBICORT) 160-4.5 MCG/ACT inhaler Inhale 2 puffs into the lungs 2 (two) times daily.    . carvedilol (COREG) 25 MG tablet Take 25 mg by mouth 2 (two) times daily with a meal.      . diclofenac (VOLTAREN) 75 MG EC tablet Take 75 mg by mouth daily.    . fexofenadine (ALLEGRA) 180 MG tablet Take 180 mg by mouth daily.      Marland Kitchen gabapentin (NEURONTIN) 300 MG capsule Take 300 mg by mouth daily.    Marland Kitchen glimepiride (AMARYL) 4 MG tablet Take 4 mg by mouth 2 (two) times daily.      Marland Kitchen HYDROcodone-acetaminophen (NORCO/VICODIN) 5-325 MG per tablet Take 1 tablet by mouth every 4 (four) hours as needed for moderate pain.    Marland Kitchen ipratropium (ATROVENT HFA) 17 MCG/ACT inhaler Inhale 2 puffs into the lungs as needed for wheezing.    . Liraglutide (VICTOZA) 18 MG/3ML SOPN Inject into the skin daily.    Marland Kitchen lisinopril (PRINIVIL,ZESTRIL) 20 MG tablet Take 20 mg by mouth daily.    Marland Kitchen  metFORMIN (GLUCOPHAGE) 1000 MG tablet Take 1,000 mg by mouth 2 (two) times daily.      . Multiple Vitamins-Minerals (CENTRUM PO) Take by mouth daily.      Marland Kitchen Neomycin-Polymyxin (ANTIBIOTIC EX) Take by mouth daily.      . nitroGLYCERIN (NITROSTAT) 0.4 MG SL tablet Place 1 tablet (0.4 mg total) under the tongue every 5 (five) minutes as needed for chest pain. 25 tablet 3  . oxyCODONE-acetaminophen (PERCOCET/ROXICET) 5-325 MG per tablet Take 1 tablet by mouth every 4 (four) hours as needed for severe pain.    . pantoprazole (PROTONIX) 40 MG tablet Take 40 mg by mouth daily.      Marland Kitchen zolpidem (AMBIEN) 10 MG tablet Take 10 mg by mouth at bedtime as needed for sleep. Take 0.25 tablet at bedtime     No current  facility-administered medications for this visit.     Past Surgical History  Procedure Laterality Date  . Hemorrhoid surgery    . Hernia repair    . Carotid stents    . 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 Cardiac catheterization  2003    70% proximal LAD (plaque rupture), 40 % mid. Left dominent  . Coronary angioplasty with stent placement  2003    Prox LAD: 3.0 X12 BMS  . Cardiac catheterization  01/26/2011    LAD: Patent proximal stent. 90% calcified eccentic stenosis in med segment, mild LCX disease  . Coronary angioplasty with stent placement  01/26/2011    Mid LAD: 3.0 X15 mm Vision BMS. Complicated by a jailed septal [name] and periprocedural MI     No Known Allergies    Family History  Problem Relation Age of Onset  . Coronary artery disease      Family History     Social History [name] reports that he quit smoking about 29 years ago. His smoking use included Cigarettes. He started smoking about 55 years ago. He has a 39 pack-year smoking history. He quit smokeless tobacco use about 29 years ago. His smokeless tobacco use included Snuff and Chew. [name] reports that he drinks alcohol.   Review of Systems CONSTITUTIONAL: No weight loss, fever, chills, weakness or fatigue.  HEENT: Eyes: No visual loss, blurred vision, double vision or yellow sclerae.No hearing loss, sneezing, congestion, runny nose or sore throat.  SKIN: No rash or itching.  CARDIOVASCULAR: per HPI RESPIRATORY: No shortness of breath, cough or sputum.  GASTROINTESTINAL: No anorexia, nausea, vomiting or diarrhea. No abdominal pain or blood.  GENITOURINARY: No burning on urination, no polyuria NEUROLOGICAL: No headache, dizziness, syncope, paralysis, ataxia, numbness or tingling in the extremities. No change in bowel or bladder control.  MUSCULOSKELETAL: No muscle, back pain, joint pain or stiffness.  LYMPHATICS: No enlarged nodes. No history of splenectomy.   PSYCHIATRIC: No history of depression or anxiety.  ENDOCRINOLOGIC: No reports of sweating, cold or heat intolerance. No polyuria or polydipsia.  Marland Kitchen   Physical Examination Filed Vitals:   01/23/15 1556  BP: 132/77  Pulse: 78   Filed Vitals:   01/23/15 1556  Height: 5\' 10"  (1.778 m)  Weight: 231 lb 12.8 oz (105.144 kg)    Gen: resting comfortably, no acute distress HEENT: no scleral icterus, pupils equal round and reactive, no palptable cervical adenopathy,  CV: RRR, no m/r/g, no jvd Resp: Clear to auscultation bilaterally GI: abdomen is soft, non-tender, non-distended, normal bowel sounds, no hepatosplenomegaly MSK: extremities are warm, no edema.  Skin: warm, no rash Neuro:  no focal deficits Psych: appropriate affect   Diagnostic Studies Cath 01/2011  STUDY FINDINGS: Hemodynamic findings: Left ventricular pressure is  176/17, with a left ventricular end-diastolic pressure of 19 mmHg.  Aortic pressure is 175/75 with a mean pressure of 118 mmHg.  Left ventricular angiography: This showed normal LV systolic function  and wall motion with an estimated ejection fraction of 65%. No  significant mitral regurgitation is noted.  Coronary angiography:  1. Left main coronary artery: The vessel is normal in size with minor  irregularities but no evidence of obstructive disease.  2. Left circumflex artery: The vessel is very large in size and  dominant. It is mildly calcified in the proximal segment with a  30% proximal stenosis. The rest of the circumflex has minor  irregularities. OM-1 is a very small-sized Dynisha Due. OM-2 is normal  in size with 30-40% ostial stenosis. OM-3 is normal in size with  mild diffuse 20% disease. The posterior AV groove is a large size  Tynslee Bowlds that gives PDA and posterolateral branches. All have mild  diffuse atherosclerosis but no evidence of obstructive disease.  3. Left anterior descending artery: The vessel is large in size and   wraps around the apex distally. A stent is noted proximally with  minimal in-stent restenosis. It appears that there is another  stent in the mid segment which is also patent with minimal  restenosis. In the mid LAD between the 2 segments, there seems to  be a severe 90% eccentric stenosis. The rest of the LAD has mild  atherosclerosis but no evidence of obstructive disease. Thediagonal branches are medium in size and free of significant  disease.  4. Right coronary artery: The vessel is small in size and  nondominant. It has mild diffuse atherosclerosis but no  obstructive disease.  STUDY CONCLUSIONS:  1. Severe single-vessel coronary artery disease with a 90% eccentric  stenosis in the mid left anterior descending artery.  2. Normal LV systolic function.  3. Recommend urgent angioplasty and stent placement to the left  anterior descending artery.   08/2013 Exercise Cardiolite exercise cardiolite from 08/2013 from prior cardiologist. 6 minutes with no ischemic changes, 87%THR, LVEF 58%, moderate size mild intensity partially reversible inferior defect thought to be soft tissue attenuation but cannot rule out RCA ischemia   05/2014 [facility] ECHOCARDIOGRAM: 05/30/2014 NORMAL LEFT VENTRICULAR SYSTOLIC FUNCTION WITH MODERATE LVH NORMAL LA PRESSURES WITH DIASTOLIC DYSFUNCTION NORMAL RIGHT VENTRICULAR SYSTOLIC FUNCTION NO VALVULAR REGURGITATION NO VALVULAR STENOSIS   05/2014 Cath Cardiac Catheterization: 05/30/2014 Diagnostic Findings Coronary arteries Dominance: left Left main: insignificant LAD: insignificant LCx: insignificant RCA: not injected Left Ventricle Catheterization EF: 55% Wall Motion: Anterior: Normal Apical: Normal Inferior: Normal Septal: Normal Lateral: Normal Impressions 1. Right radial 2. LAD wih mild - moderate atherosclerosis - 40% in-stent 3. LCX dominant with 20-30% 4. RCA not injected Recommendations 1. No significant CAD 2. LV  normal      Assessment and Plan  1. Chest pain - long history of intermittent chest pain. Multiple negative stress tests, recent admit 05/2014 at [facility] with cath that showed non-obstructive CAD, likely MSK pain - no current symptoms - continue risk factor modification and secondary prevention.   2. HTN - not at goal based on DM2. Will increase lisinopril to 40mg  daily, check Bmet in 2 weeks   F/u 6 months    Arnoldo Lenis, M.D., F.A.C.C.

## 2015-02-06 ENCOUNTER — Encounter: Payer: Self-pay | Admitting: *Deleted

## 2016-11-02 ENCOUNTER — Ambulatory Visit: Payer: Medicare Other | Admitting: Cardiology

## 2016-11-02 ENCOUNTER — Encounter: Payer: Self-pay | Admitting: Cardiology

## 2016-11-02 NOTE — Progress Notes (Deleted)
Clinical Summary [name] is a 71 y.o.male seen today for follow up of the following medical problems.   1. CAD  - hx of BMS to LAD in 2003  - cath 01/2011 with LM patent, LAD with prox stent mild ISR, patent mid stent, 90% mid LAD lesion. LCX 30% prox, OM2 30-40%, RCA small non-dom with no disease. Received additional stent to LAD at that time, a septal Laney Bagshaw was jailed. LVEF 65% by LV gram.  - exercise cardiolite from 08/2013 from prior cardiologist. 6 minutes with no ischemic changes, 87%THR, LVEF 58%, moderate size mild intensity partially reversible inferior defect thought to be soft tissue attenuation but cannot rule out RCA ischemia -[name] admit with chest pain 04/2014. Sharp pain mid to left chest, 10/10. No other associated symptoms. Not positional. Better with NG. Symptoms would last just a few minutes at at time. Lexiscan MPI during admission with no ischemia, LVEF 60%. 05/2014 cath at [facility] patent coronaries  - denies any chest pain since last visit. Compliant with meds  2. HTN - checks at home 1-2 times a week. Typically around 140s/70s  3. HL - compliant with lipitor   Past Medical History:  Diagnosis Date  . Coronary atherosclerosis of native coronary artery  04/22/2001      Cardiac Catheterization  . Diverticulitis   . Encounter for long-term (current) use of insulin ([place])   . Esophageal reflux   . Gout, unspecified   . Old myocardial infarction   . Personal history of tobacco use, presenting hazards to health   . Postsurgical percutaneous transluminal coronary angioplasty status   . Pure hypercholesterolemia   . 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 Unspecified essential hypertension      No Known Allergies   Current Outpatient Prescriptions  Medication Sig Dispense Refill  . allopurinol (ZYLOPRIM) 300 MG tablet Take 300 mg by mouth daily.    Marland Kitchen ALPRAZolam  (XANAX) 0.5 MG tablet Take 0.5 mg by mouth at bedtime as needed for anxiety.    Marland Kitchen amLODipine (NORVASC) 10 MG tablet Take 1 tablet (10 mg total) by mouth daily. 30 tablet 6  . aspirin 81 MG tablet Take 81 mg by mouth daily.      Marland Kitchen atorvastatin (LIPITOR) 80 MG tablet TAKE 1 TABLET (80 MG TOTAL) BY MOUTH DAILY. (STOP SIMVASTATIN) 30 tablet 6  . budesonide-formoterol (SYMBICORT) 160-4.5 MCG/ACT inhaler Inhale 2 puffs into the lungs 2 (two) times daily.    . carvedilol (COREG) 25 MG tablet Take 25 mg by mouth 2 (two) times daily with a meal.      . diclofenac (VOLTAREN) 75 MG EC tablet Take 75 mg by mouth daily.    . fexofenadine (ALLEGRA) 180 MG tablet Take 180 mg by mouth daily.      . furosemide (LASIX) 40 MG tablet Take 40 mg by mouth as needed.    . gabapentin (NEURONTIN) 300 MG capsule Take 300 mg by mouth daily.    Marland Kitchen glimepiride (AMARYL) 4 MG tablet Take 4 mg by mouth daily with breakfast.    . insulin regular (NOVOLIN R,HUMULIN R) 100 units/mL injection Inject into the skin 3 (three) times daily before meals.    Marland Kitchen ipratropium (ATROVENT HFA) 17 MCG/ACT inhaler Inhale 2 puffs into the lungs as needed for wheezing.    . Ketoprofen 10 % CREA by Does not apply route.    Marland Kitchen  Liraglutide (VICTOZA) 18 MG/3ML SOPN Inject into the skin daily.    Marland Kitchen lisinopril (PRINIVIL,ZESTRIL) 40 MG tablet Take 1 tablet (40 mg total) by mouth daily. 90 tablet 3  . meclizine (ANTIVERT) 25 MG tablet Take 25 mg by mouth 3 (three) times daily as needed for dizziness.    . metFORMIN (GLUCOPHAGE) 1000 MG tablet Take 1,000 mg by mouth 2 (two) times daily.      . metolazone (ZAROXOLYN) 2.5 MG tablet Take 2.5 mg by mouth daily.    . montelukast (SINGULAIR) 10 MG tablet Take 10 mg by mouth at bedtime.    . Multiple Vitamins-Minerals (CENTRUM PO) Take by mouth daily.      . nitroGLYCERIN (NITROSTAT) 0.4 MG SL tablet Place 1 tablet (0.4 mg total) under the tongue every 5 (five) minutes as needed for chest pain. 25 tablet 3  .  pantoprazole (PROTONIX) 40 MG tablet Take 40 mg by mouth daily.      . repaglinide (PRANDIN) 1 MG tablet Take 1 mg by mouth 3 (three) times daily before meals.    Marland Kitchen zolpidem (AMBIEN) 10 MG tablet Take 10 mg by mouth at bedtime as needed for sleep. Take 0.25 tablet at bedtime     No current facility-administered medications for this visit.      Past Surgical History:  Procedure Laterality Date  . 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 CARDIAC CATHETERIZATION  2003   70% proximal LAD (plaque rupture), 40 % mid. Left dominent  . CARDIAC CATHETERIZATION  01/26/2011   LAD: Patent proximal stent. 90% calcified eccentic stenosis in med segment, mild LCX disease  . CAROTID STENTS    . CORONARY ANGIOPLASTY WITH STENT PLACEMENT  2003   Prox LAD: 3.0 X12 BMS  . CORONARY ANGIOPLASTY WITH STENT PLACEMENT  01/26/2011   Mid LAD: 3.0 X15 mm Vision BMS. Complicated by a jailed septal Avrian Delfavero and periprocedural MI  . HEMORRHOID SURGERY    . HERNIA REPAIR       No Known Allergies    Family History  Problem Relation Age of Onset  . Coronary artery disease Unknown        Family History     Social History [name] reports that he quit smoking about 31 years ago. His smoking use included Cigarettes. He started smoking about 57 years ago. He has a 39.00 pack-year smoking history. He quit smokeless tobacco use about 31 years ago. His smokeless tobacco use included Snuff and Chew. [name] reports that he drinks alcohol.   Review of Systems CONSTITUTIONAL: No weight loss, fever, chills, weakness or fatigue.  HEENT: Eyes: No visual loss, blurred vision, double vision or yellow sclerae.No hearing loss, sneezing, congestion, runny nose or sore throat.  SKIN: No rash or itching.  CARDIOVASCULAR:  RESPIRATORY: No shortness of breath, cough or sputum.  GASTROINTESTINAL: No anorexia, nausea, vomiting or diarrhea. No abdominal pain or blood.  GENITOURINARY: No burning  on urination, no polyuria NEUROLOGICAL: No headache, dizziness, syncope, paralysis, ataxia, numbness or tingling in the extremities. No change in bowel or bladder control.  MUSCULOSKELETAL: No muscle, back pain, joint pain or stiffness.  LYMPHATICS: No enlarged nodes. No history of splenectomy.  PSYCHIATRIC: No history of depression or anxiety.  ENDOCRINOLOGIC: No reports of sweating, cold or heat intolerance. No polyuria or polydipsia.  Marland Kitchen   Physical Examination There were no vitals filed for this visit. There were no vitals filed for this visit.  Gen:  resting comfortably, no acute distress HEENT: no scleral icterus, pupils equal round and reactive, no palptable cervical adenopathy,  CV Resp: Clear to auscultation bilaterally GI: abdomen is soft, non-tender, non-distended, normal bowel sounds, no hepatosplenomegaly MSK: extremities are warm, no edema.  Skin: warm, no rash Neuro:  no focal deficits Psych: appropriate affect   Diagnostic Studies Cath 01/2011  STUDY FINDINGS: Hemodynamic findings: Left ventricular pressure is  176/17, with a left ventricular end-diastolic pressure of 19 mmHg.  Aortic pressure is 175/75 with a mean pressure of 118 mmHg.  Left ventricular angiography: This showed normal LV systolic function  and wall motion with an estimated ejection fraction of 65%. No  significant mitral regurgitation is noted.  Coronary angiography:  1. Left main coronary artery: The vessel is normal in size with minor  irregularities but no evidence of obstructive disease.  2. Left circumflex artery: The vessel is very large in size and  dominant. It is mildly calcified in the proximal segment with a  30% proximal stenosis. The rest of the circumflex has minor  irregularities. OM-1 is a very small-sized Edmund Rick. OM-2 is normal  in size with 30-40% ostial stenosis. OM-3 is normal in size with  mild diffuse 20% disease. The posterior AV groove is a large size   Erminio Nygard that gives PDA and posterolateral branches. All have mild  diffuse atherosclerosis but no evidence of obstructive disease.  3. Left anterior descending artery: The vessel is large in size and  wraps around the apex distally. A stent is noted proximally with  minimal in-stent restenosis. It appears that there is another  stent in the mid segment which is also patent with minimal  restenosis. In the mid LAD between the 2 segments, there seems to  be a severe 90% eccentric stenosis. The rest of the LAD has mild  atherosclerosis but no evidence of obstructive disease. Thediagonal branches are medium in size and free of significant  disease.  4. Right coronary artery: The vessel is small in size and  nondominant. It has mild diffuse atherosclerosis but no  obstructive disease.  STUDY CONCLUSIONS:  1. Severe single-vessel coronary artery disease with a 90% eccentric  stenosis in the mid left anterior descending artery.  2. Normal LV systolic function.  3. Recommend urgent angioplasty and stent placement to the left  anterior descending artery.   08/2013 Exercise Cardiolite exercise cardiolite from 08/2013 from prior cardiologist. 6 minutes with no ischemic changes, 87%THR, LVEF 58%, moderate size mild intensity partially reversible inferior defect thought to be soft tissue attenuation but cannot rule out RCA ischemia   05/2014 Echo [facility] ECHOCARDIOGRAM: 05/30/2014 NORMAL LEFT VENTRICULAR SYSTOLIC FUNCTION WITH MODERATE LVH NORMAL LA PRESSURES WITH DIASTOLIC DYSFUNCTION NORMAL RIGHT VENTRICULAR SYSTOLIC FUNCTION NO VALVULAR REGURGITATION NO VALVULAR STENOSIS   05/2014 Cath Cardiac Catheterization: 05/30/2014 Diagnostic Findings Coronary arteries Dominance: left Left main: insignificant LAD: insignificant LCx: insignificant RCA: not injected Left Ventricle Catheterization EF: 55% Wall Motion: Anterior: Normal Apical: Normal Inferior: Normal Septal:  Normal Lateral: Normal Impressions 1. Right radial 2. LAD wih mild - moderate atherosclerosis - 40% in-stent 3. LCX dominant with 20-30% 4. RCA not injected Recommendations 1. No significant CAD 2. LV normal    Assessment and Plan  1. Chest pain - long history of intermittent chest pain. Multiple negative stress tests, recent admit 05/2014 at [facility] with cath that showed non-obstructive CAD, likely MSK pain - no current symptoms - continue risk factor modification and secondary prevention.   2. HTN - not  at goal based on DM2. Will increase lisinopril to 40mg  daily, check Bmet in 2 weeks   F/u 6 months      Arnoldo Lenis, M.D., F.A.C.C.

## 2017-01-16 ENCOUNTER — Ambulatory Visit (INDEPENDENT_AMBULATORY_CARE_PROVIDER_SITE_OTHER): Payer: Medicare Other | Admitting: Cardiology

## 2017-01-16 ENCOUNTER — Encounter: Payer: Self-pay | Admitting: *Deleted

## 2017-01-16 ENCOUNTER — Encounter: Payer: Self-pay | Admitting: Cardiology

## 2017-01-16 VITALS — BP 118/78 | HR 68 | Ht 70.0 in | Wt 251.0 lb

## 2017-01-16 DIAGNOSIS — I2510 Atherosclerotic heart disease of native coronary artery without angina pectoris: Secondary | ICD-10-CM

## 2017-01-16 DIAGNOSIS — I10 Essential (primary) hypertension: Secondary | ICD-10-CM

## 2017-01-16 DIAGNOSIS — R0789 Other chest pain: Secondary | ICD-10-CM | POA: Diagnosis not present

## 2017-01-16 DIAGNOSIS — R0602 Shortness of breath: Secondary | ICD-10-CM

## 2017-01-16 DIAGNOSIS — E782 Mixed hyperlipidemia: Secondary | ICD-10-CM

## 2017-01-16 NOTE — Progress Notes (Signed)
Clinical Summary Mr. Mahaffy is a 71 y.o.male seen today for follow up of the following medical problems.   1. CAD  - hx of BMS to LAD in 2003  - cath 01/2011 with LM patent, LAD with prox stent mild ISR, patent mid stent, 90% mid LAD lesion. LCX 30% prox, OM2 30-40%, RCA small non-dom with no disease. Received additional stent to LAD at that time, a septal Sean Malinowski was jailed. LVEF 65% by LV gram.  - exercise cardiolite from 08/2013 from prior cardiologist. 6 minutes with no ischemic changes, 87%THR, LVEF 58%, moderate size mild intensity partially reversible inferior defect thought to be soft tissue attenuation but cannot rule out RCA ischemia -[name] admit with chest pain 04/2014. Sharp pain mid to left chest, 10/10. No other associated symptoms. Not positional. Better with NG. Symptoms would last just a few minutes at at time. Lexiscan MPI during admission with no ischemia, LVEF 60%. 05/2014 cath at [facility] patent coronaries   - recent chest pain x 3 months. Left sided nonspecific pain, 5/10 in severity. Can occur at rest or with exertion, more with activity. No other associated symptoms. Lasts a few minutes. Sporadic, can occur daily. Can be better with NG.   2. LE swelling - 3 months increased swelling leg swelling.  - increased DOE. No orthopnea. Since our last visit 2 years ago he is up 20 lbs.   3. HTN - compliant with meds  4. HL - he is compliant with statin  Past Medical History:  Diagnosis Date  . Coronary atherosclerosis of native coronary artery  04/22/2001      Cardiac Catheterization  . Diverticulitis   . Encounter for long-term (current) use of insulin ([place])   . Esophageal reflux   . Gout, unspecified   . Old myocardial infarction   . Personal history of tobacco use, presenting hazards to health   . Postsurgical percutaneous transluminal coronary angioplasty status   . Pure hypercholesterolemia   . Type II or unspecified type diabetes mellitus without  mention of complication, not stated as uncontrolled    TYPE 11  . Unspecified essential hypertension      No Known Allergies   Current Outpatient Prescriptions  Medication Sig Dispense Refill  . allopurinol (ZYLOPRIM) 300 MG tablet Take 300 mg by mouth daily.    Marland Kitchen ALPRAZolam (XANAX) 0.5 MG tablet Take 0.5 mg by mouth at bedtime as needed for anxiety.    Marland Kitchen amLODipine (NORVASC) 10 MG tablet Take 1 tablet (10 mg total) by mouth daily. 30 tablet 6  . aspirin 81 MG tablet Take 81 mg by mouth daily.      Marland Kitchen atorvastatin (LIPITOR) 80 MG tablet TAKE 1 TABLET (80 MG TOTAL) BY MOUTH DAILY. (STOP SIMVASTATIN) 30 tablet 6  . budesonide-formoterol (SYMBICORT) 160-4.5 MCG/ACT inhaler Inhale 2 puffs into the lungs 2 (two) times daily.    . carvedilol (COREG) 25 MG tablet Take 25 mg by mouth 2 (two) times daily with a meal.      . diclofenac (VOLTAREN) 75 MG EC tablet Take 75 mg by mouth daily.    . fexofenadine (ALLEGRA) 180 MG tablet Take 180 mg by mouth daily.      . furosemide (LASIX) 40 MG tablet Take 40 mg by mouth as needed.    . gabapentin (NEURONTIN) 300 MG capsule Take 300 mg by mouth daily.    Marland Kitchen glimepiride (AMARYL) 4 MG tablet Take 4 mg by mouth daily with breakfast.    .  insulin regular (NOVOLIN R,HUMULIN R) 100 units/mL injection Inject into the skin 3 (three) times daily before meals.    Marland Kitchen ipratropium (ATROVENT HFA) 17 MCG/ACT inhaler Inhale 2 puffs into the lungs as needed for wheezing.    . Ketoprofen 10 % CREA by Does not apply route.    . Liraglutide (VICTOZA) 18 MG/3ML SOPN Inject into the skin daily.    Marland Kitchen lisinopril (PRINIVIL,ZESTRIL) 40 MG tablet Take 1 tablet (40 mg total) by mouth daily. 90 tablet 3  . meclizine (ANTIVERT) 25 MG tablet Take 25 mg by mouth 3 (three) times daily as needed for dizziness.    . metFORMIN (GLUCOPHAGE) 1000 MG tablet Take 1,000 mg by mouth 2 (two) times daily.      . metolazone (ZAROXOLYN) 2.5 MG tablet Take 2.5 mg by mouth daily.    . montelukast  (SINGULAIR) 10 MG tablet Take 10 mg by mouth at bedtime.    . Multiple Vitamins-Minerals (CENTRUM PO) Take by mouth daily.      . nitroGLYCERIN (NITROSTAT) 0.4 MG SL tablet Place 1 tablet (0.4 mg total) under the tongue every 5 (five) minutes as needed for chest pain. 25 tablet 3  . pantoprazole (PROTONIX) 40 MG tablet Take 40 mg by mouth daily.      . repaglinide (PRANDIN) 1 MG tablet Take 1 mg by mouth 3 (three) times daily before meals.    Marland Kitchen zolpidem (AMBIEN) 10 MG tablet Take 10 mg by mouth at bedtime as needed for sleep. Take 0.25 tablet at bedtime     No current facility-administered medications for this visit.      Past Surgical History:  Procedure Laterality Date  . 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 CARDIAC CATHETERIZATION  2003   70% proximal LAD (plaque rupture), 40 % mid. Left dominent  . CARDIAC CATHETERIZATION  01/26/2011   LAD: Patent proximal stent. 90% calcified eccentic stenosis in med segment, mild LCX disease  . CAROTID STENTS    . CORONARY ANGIOPLASTY WITH STENT PLACEMENT  2003   Prox LAD: 3.0 X12 BMS  . CORONARY ANGIOPLASTY WITH STENT PLACEMENT  01/26/2011   Mid LAD: 3.0 X15 mm Vision BMS. Complicated by a jailed septal [place] and periprocedural MI  . HEMORRHOID SURGERY    . HERNIA REPAIR       No Known Allergies    Family History  Problem Relation Age of Onset  . Coronary artery disease Unknown        Family History     Social History Mr. Pina reports that he quit smoking about 31 years ago. His smoking use included Cigarettes. He started smoking about 57 years ago. He has a 39.00 pack-year smoking history. He quit smokeless tobacco use about 31 years ago. His smokeless tobacco use included Snuff and Chew. Mr. Reitter reports that he drinks alcohol.   Review of Systems CONSTITUTIONAL: No weight loss, fever, chills, weakness or fatigue.  HEENT: Eyes: No visual loss, blurred vision, double vision or yellow  sclerae.No hearing loss, sneezing, congestion, runny nose or sore throat.  SKIN: No rash or itching.  CARDIOVASCULAR: per hpi RESPIRATORY: per hpi GASTROINTESTINAL: No anorexia, nausea, vomiting or diarrhea. No abdominal pain or blood.  GENITOURINARY: No burning on urination, no polyuria NEUROLOGICAL: No headache, dizziness, syncope, paralysis, ataxia, numbness or tingling in the extremities. No change in bowel or bladder control.  MUSCULOSKELETAL: No muscle, back pain, joint pain or stiffness.  LYMPHATICS: No enlarged nodes.  No history of splenectomy.  PSYCHIATRIC: No history of depression or anxiety.  ENDOCRINOLOGIC: No reports of sweating, cold or heat intolerance. No polyuria or polydipsia.  Marland Kitchen   Physical Examination Vitals:   01/16/17 0858  BP: 118/78  Pulse: 68  SpO2: 94%   Vitals:   01/16/17 0858  Weight: 251 lb (113.9 kg)  Height: 5\' 10"  (1.778 m)    Gen: resting comfortably, no acute distress HEENT: no scleral icterus, pupils equal round and reactive, no palptable cervical adenopathy,  CV:" RRR, no m/r/g, no jvd Resp: Clear to auscultation bilaterally GI: abdomen is soft, non-tender, non-distended, normal bowel sounds, no hepatosplenomegaly MSK: extremities are warm, 2+ bilateral LE edema Skin: warm, no rash Neuro:  no focal deficits Psych: appropriate affect   Diagnostic Studies Cath 01/2011  STUDY FINDINGS: Hemodynamic findings: Left ventricular pressure is  176/17, with a left ventricular end-diastolic pressure of 19 mmHg.  Aortic pressure is 175/75 with a mean pressure of 118 mmHg.  Left ventricular angiography: This showed normal LV systolic function  and wall motion with an estimated ejection fraction of 65%. No  significant mitral regurgitation is noted.  Coronary angiography:  1. Left main coronary artery: The vessel is normal in size with minor  irregularities but no evidence of obstructive disease.  2. Left circumflex artery: The vessel is  very large in size and  dominant. It is mildly calcified in the proximal segment with a  30% proximal stenosis. The rest of the circumflex has minor  irregularities. OM-1 is a very small-sized Miela Desjardin. OM-2 is normal  in size with 30-40% ostial stenosis. OM-3 is normal in size with  mild diffuse 20% disease. The posterior AV groove is a large size  Dartanion Teo that gives PDA and posterolateral branches. All have mild  diffuse atherosclerosis but no evidence of obstructive disease.  3. Left anterior descending artery: The vessel is large in size and  wraps around the apex distally. A stent is noted proximally with  minimal in-stent restenosis. It appears that there is another  stent in the mid segment which is also patent with minimal  restenosis. In the mid LAD between the 2 segments, there seems to  be a severe 90% eccentric stenosis. The rest of the LAD has mild  atherosclerosis but no evidence of obstructive disease. Thediagonal branches are medium in size and free of significant  disease.  4. Right coronary artery: The vessel is small in size and  nondominant. It has mild diffuse atherosclerosis but no  obstructive disease.  STUDY CONCLUSIONS:  1. Severe single-vessel coronary artery disease with a 90% eccentric  stenosis in the mid left anterior descending artery.  2. Normal LV systolic function.  3. Recommend urgent angioplasty and stent placement to the left  anterior descending artery.   08/2013 Exercise Cardiolite exercise cardiolite from 08/2013 from prior cardiologist. 6 minutes with no ischemic changes, 87%THR, LVEF 58%, moderate size mild intensity partially reversible inferior defect thought to be soft tissue attenuation but cannot rule out RCA ischemia   05/2014 Echo [facility] ECHOCARDIOGRAM: 05/30/2014 NORMAL LEFT VENTRICULAR SYSTOLIC FUNCTION WITH MODERATE LVH NORMAL LA PRESSURES WITH DIASTOLIC DYSFUNCTION NORMAL RIGHT VENTRICULAR SYSTOLIC FUNCTION NO  VALVULAR REGURGITATION NO VALVULAR STENOSIS   05/2014 Cath Cardiac Catheterization: 05/30/2014 Diagnostic Findings Coronary arteries Dominance: left Left main: insignificant LAD: insignificant LCx: insignificant RCA: not injected Left Ventricle Catheterization EF: 55% Wall Motion: Anterior: Normal Apical: Normal Inferior: Normal Septal: Normal Lateral: Normal Impressions 1. Right radial 2. LAD wih mild -  moderate atherosclerosis - 40% in-stent 3. LCX dominant with 20-30% 4. RCA not injected Recommendations 1. No significant CAD 2. LV normal    Jan 2016 nuclear stress [name] Negative for ischemia  08/2013 nuclear stress test [place] Mild inferior ischemia   Assessment and Plan   1. Chest pain/CAD - recent chest pain symptoms - given his significant LE edema and weight gain we will start workup with echo and decide either invasive vs noninvastive ischemic testing based on results  2. LE edema/SOB - severe bilateral LE edema, 20 lbs weight gain over the last 2 years - he is only taking his lasix 40mg  daily, we will increase to bid. He has not been on metolazone as listed on his med list - obtain echo - recheck weights with nursing visit on day of his echo   3. HTN - at goal, continue current meds  4. Hyperlipidemia - request labs from pcp, continue statin   F/u 3 weeks.         Arnoldo Lenis, M.D.

## 2017-01-16 NOTE — Patient Instructions (Signed)
Your physician recommends that you schedule a follow-up appointment in: [place] has recommended you make the following change in your medication:   TAKE LASIX 40 MG TWICE DAILY AS WRITTEN   Your physician has requested that you have an echocardiogram. Echocardiography is a painless test that uses sound waves to create images of your heart. It provides your doctor with information about the size and shape of your heart and how well your heart's chambers and valves are working. This procedure takes approximately one hour. There are no restrictions for this procedure.  WE WILL SCHEDULE NURSE VISIT SAME DAY AS ECHO TO RECHECK VITALS.  Thank you for choosing [facility]!!

## 2017-01-25 ENCOUNTER — Other Ambulatory Visit: Payer: Self-pay

## 2017-01-25 ENCOUNTER — Ambulatory Visit (INDEPENDENT_AMBULATORY_CARE_PROVIDER_SITE_OTHER): Payer: Medicare Other

## 2017-01-25 DIAGNOSIS — R0602 Shortness of breath: Secondary | ICD-10-CM | POA: Diagnosis not present

## 2017-01-26 ENCOUNTER — Telehealth: Payer: Self-pay | Admitting: *Deleted

## 2017-01-26 NOTE — Telephone Encounter (Signed)
-----   Message from Herminio Commons, MD sent at 01/26/2017  8:19 AM EDT ----- Normal pumping function. Mild stiffness with relaxation and mild valve leakage.

## 2017-01-26 NOTE — Telephone Encounter (Signed)
Pt aware - routed to pcp  

## 2017-02-06 ENCOUNTER — Encounter: Payer: Self-pay | Admitting: *Deleted

## 2017-02-07 ENCOUNTER — Telehealth: Payer: Self-pay | Admitting: Cardiology

## 2017-02-07 ENCOUNTER — Ambulatory Visit (INDEPENDENT_AMBULATORY_CARE_PROVIDER_SITE_OTHER): Payer: Medicare Other | Admitting: Cardiology

## 2017-02-07 ENCOUNTER — Encounter: Payer: Self-pay | Admitting: *Deleted

## 2017-02-07 ENCOUNTER — Encounter: Payer: Self-pay | Admitting: Cardiology

## 2017-02-07 VITALS — BP 128/60 | HR 56 | Ht 70.0 in | Wt 257.0 lb

## 2017-02-07 DIAGNOSIS — E782 Mixed hyperlipidemia: Secondary | ICD-10-CM

## 2017-02-07 DIAGNOSIS — R600 Localized edema: Secondary | ICD-10-CM | POA: Diagnosis not present

## 2017-02-07 DIAGNOSIS — I10 Essential (primary) hypertension: Secondary | ICD-10-CM

## 2017-02-07 DIAGNOSIS — R079 Chest pain, unspecified: Secondary | ICD-10-CM | POA: Diagnosis not present

## 2017-02-07 DIAGNOSIS — I2510 Atherosclerotic heart disease of native coronary artery without angina pectoris: Secondary | ICD-10-CM | POA: Diagnosis not present

## 2017-02-07 MED ORDER — ISOSORBIDE MONONITRATE ER 30 MG PO TB24: 15.0000 mg | ORAL_TABLET | Freq: Every day | ORAL | 1 refills | Status: DC

## 2017-02-07 MED ORDER — FUROSEMIDE 40 MG PO TABS: 60.0000 mg | ORAL_TABLET | Freq: Two times a day (BID) | ORAL | 3 refills | Status: DC

## 2017-02-07 NOTE — Patient Instructions (Addendum)
Your physician recommends that you schedule a follow-up appointment in: [place] has recommended you make the following change in your medication:   START IMDUR 15 MG (1/2 TABLET) DAILY  INCREASE LASIX 60 MG (1 1/2 TABLETS) TWICE DAILY   Your physician recommends that you return for lab work BMP/MG  Your physician has requested that you have a [place]. For further information please visit [email or web address]. Please follow instruction sheet, as given.  PLEASE WEIGHT YOURSELF DAILY FOR 1 WEEK AND CALL us WITH READINGS  Thank you for choosing [facility]!!

## 2017-02-07 NOTE — Telephone Encounter (Signed)
Pre-cert Verification for the following procedure   LEXISCAN DX CHEST PAIN scheduled for 02-13-17

## 2017-02-07 NOTE — Progress Notes (Signed)
Clinical Summary [name] is a 71 y.o.male   1. CAD  - hx of BMS to LAD in 2003  - cath 01/2011 with LM patent, LAD with prox stent mild ISR, patent mid stent, 90% mid LAD lesion. LCX 30% prox, OM2 30-40%, RCA small non-dom with no disease. Received additional stent to LAD at that time, a septal [name] was jailed. LVEF 65% by LV gram.  - exercise cardiolite from 08/2013 from prior cardiologist. 6 minutes with no ischemic changes, 87%THR, LVEF 58%, moderate size mild intensity partially reversible inferior defect thought to be soft tissue attenuation but cannot rule out RCA ischemia -[name] admit with chest pain 04/2014. Sharp pain mid to left chest, 10/10. No other associated symptoms. Not positional. Better with NG. Symptoms would last just a few minutes at at time. Lexiscan MPI during admission with no ischemia, LVEF 60%. 05/2014 cath at [facility] patent coronaries    -01/2017 echo: LVEF 18-56%, grade I diastolic dysfunction - still with some chest pain at times - cannot run treadmill due to knee pain.   2. LE swelling - 3 months increased swelling leg swelling.   - last visit we increased lasix to 40mg  bid - legs remain swelled. No significant SOB. No orthopnea    3. HTN - he is compliant with bp meds  4. Hyperlipidemia - he remains compliant with statin   Past Medical History:  Diagnosis Date  . Coronary atherosclerosis of native coronary artery  04/22/2001      Cardiac Catheterization  . Diverticulitis   . Encounter for long-term (current) use of insulin ([place])   . Esophageal reflux   . Gout, unspecified   . Old myocardial infarction   . Personal history of tobacco use, presenting hazards to health   . Postsurgical percutaneous transluminal coronary angioplasty status   . Pure hypercholesterolemia   . 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 Unspecified essential hypertension        No Known Allergies   Current Outpatient Prescriptions  Medication Sig Dispense Refill  . allopurinol (ZYLOPRIM) 300 MG tablet Take 300 mg by mouth daily.    Marland Kitchen ALPRAZolam (XANAX) 0.5 MG tablet Take 0.5 mg by mouth at bedtime as needed for anxiety.    Marland Kitchen amLODipine (NORVASC) 10 MG tablet Take 1 tablet (10 mg total) by mouth daily. 30 tablet 6  . aspirin 81 MG tablet Take 81 mg by mouth daily.      Marland Kitchen atorvastatin (LIPITOR) 80 MG tablet TAKE 1 TABLET (80 MG TOTAL) BY MOUTH DAILY. (STOP SIMVASTATIN) 30 tablet 6  . budesonide-formoterol (SYMBICORT) 160-4.5 MCG/ACT inhaler Inhale 2 puffs into the lungs 2 (two) times daily.    . carvedilol (COREG) 25 MG tablet Take 25 mg by mouth 2 (two) times daily with a meal.      . diclofenac (VOLTAREN) 75 MG EC tablet Take 75 mg by mouth daily.    . fexofenadine (ALLEGRA) 180 MG tablet Take 180 mg by mouth daily.      . furosemide (LASIX) 40 MG tablet Take 40 mg by mouth 2 (two) times daily.     Marland Kitchen gabapentin (NEURONTIN) 300 MG capsule Take 300 mg by mouth daily.    Marland Kitchen glimepiride (AMARYL) 4 MG tablet Take 4 mg by mouth daily with breakfast.    . insulin regular (NOVOLIN R,HUMULIN R) 100 units/mL injection Inject into the skin 3 (three)  times daily before meals.    Marland Kitchen ipratropium (ATROVENT HFA) 17 MCG/ACT inhaler Inhale 2 puffs into the lungs as needed for wheezing.    . Ketoprofen 10 % CREA by Does not apply route.    . Liraglutide (VICTOZA) 18 MG/3ML SOPN Inject into the skin daily.    Marland Kitchen lisinopril (PRINIVIL,ZESTRIL) 40 MG tablet Take 1 tablet (40 mg total) by mouth daily. 90 tablet 3  . meclizine (ANTIVERT) 25 MG tablet Take 25 mg by mouth 3 (three) times daily as needed for dizziness.    . metFORMIN (GLUCOPHAGE) 1000 MG tablet Take 1,000 mg by mouth 2 (two) times daily.      . montelukast (SINGULAIR) 10 MG tablet Take 10 mg by mouth at bedtime.    . Multiple Vitamins-Minerals (CENTRUM PO) Take by mouth daily.      . nitroGLYCERIN (NITROSTAT) 0.4 MG SL  tablet Place 1 tablet (0.4 mg total) under the tongue every 5 (five) minutes as needed for chest pain. 25 tablet 3  . pantoprazole (PROTONIX) 40 MG tablet Take 40 mg by mouth daily.      . repaglinide (PRANDIN) 1 MG tablet Take 1 mg by mouth 3 (three) times daily before meals.    Marland Kitchen zolpidem (AMBIEN) 10 MG tablet Take 10 mg by mouth at bedtime as needed for sleep. Take 0.25 tablet at bedtime     No current facility-administered medications for this visit.      Past Surgical History:  Procedure Laterality Date  . 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 CARDIAC CATHETERIZATION  2003   70% proximal LAD (plaque rupture), 40 % mid. Left dominent  . CARDIAC CATHETERIZATION  01/26/2011   LAD: Patent proximal stent. 90% calcified eccentic stenosis in med segment, mild LCX disease  . CAROTID STENTS    . CORONARY ANGIOPLASTY WITH STENT PLACEMENT  2003   Prox LAD: 3.0 X12 BMS  . CORONARY ANGIOPLASTY WITH STENT PLACEMENT  01/26/2011   Mid LAD: 3.0 X15 mm Vision BMS. Complicated by a jailed septal [name] and periprocedural MI  . HEMORRHOID SURGERY    . HERNIA REPAIR       No Known Allergies    Family History  Problem Relation Age of Onset  . Coronary artery disease Unknown        Family History     Social History [name] reports that he quit smoking about 31 years ago. His smoking use included Cigarettes. He started smoking about 57 years ago. He has a 39.00 pack-year smoking history. He quit smokeless tobacco use about 31 years ago. His smokeless tobacco use included Snuff and Chew. [name] reports that he drinks alcohol.   Review of Systems CONSTITUTIONAL: No weight loss, fever, chills, weakness or fatigue.  HEENT: Eyes: No visual loss, blurred vision, double vision or yellow sclerae.No hearing loss, sneezing, congestion, runny nose or sore throat.  SKIN: No rash or itching.  CARDIOVASCULAR: per hpi RESPIRATORY: No shortness of breath, cough or  sputum.  GASTROINTESTINAL: No anorexia, nausea, vomiting or diarrhea. No abdominal pain or blood.  GENITOURINARY: No burning on urination, no polyuria NEUROLOGICAL: No headache, dizziness, syncope, paralysis, ataxia, numbness or tingling in the extremities. No change in bowel or bladder control.  MUSCULOSKELETAL: No muscle, back pain, joint pain or stiffness.  LYMPHATICS: No enlarged nodes. No history of splenectomy.  PSYCHIATRIC: No history of depression or anxiety.  ENDOCRINOLOGIC: No reports of sweating, cold or heat intolerance. No  polyuria or polydipsia.  Marland Kitchen   Physical Examination Vitals:   02/07/17 0827  BP: 128/60  Pulse: (!) 56  SpO2: 93%   Vitals:   02/07/17 0827  Weight: 257 lb (116.6 kg)  Height: 5\' 10"  (1.778 m)    Gen: resting comfortably, no acute distress HEENT: no scleral icterus, pupils equal round and reactive, no palptable cervical adenopathy,  CV: RRR, no m/r/g, no jvd Resp: Clear to auscultation bilaterally GI: abdomen is soft, non-tender, non-distended, normal bowel sounds, no hepatosplenomegaly MSK: extremities are warm, no edema.  Skin: warm, no rash Neuro:  no focal deficits Psych: appropriate affect   Diagnostic Studies Cath 01/2011 STUDY FINDINGS: Hemodynamic findings: Left ventricular pressure is  176/17, with a left ventricular end-diastolic pressure of 19 mmHg.  Aortic pressure is 175/75 with a mean pressure of 118 mmHg.  Left ventricular angiography: This showed normal LV systolic function  and wall motion with an estimated ejection fraction of 65%. No  significant mitral regurgitation is noted.  Coronary angiography:  1. Left main coronary artery: The vessel is normal in size with minor  irregularities but no evidence of obstructive disease.  2. Left circumflex artery: The vessel is very large in size and  dominant. It is mildly calcified in the proximal segment with a  30% proximal stenosis. The rest of the circumflex has  minor  irregularities. OM-1 is a very small-sized [name]. OM-2 is normal  in size with 30-40% ostial stenosis. OM-3 is normal in size with  mild diffuse 20% disease. The posterior AV groove is a large size  [name] that gives PDA and posterolateral branches. All have mild  diffuse atherosclerosis but no evidence of obstructive disease.  3. Left anterior descending artery: The vessel is large in size and  wraps around the apex distally. A stent is noted proximally with  minimal in-stent restenosis. It appears that there is another  stent in the mid segment which is also patent with minimal  restenosis. In the mid LAD between the 2 segments, there seems to  be a severe 90% eccentric stenosis. The rest of the LAD has mild  atherosclerosis but no evidence of obstructive disease. [name] branches are medium in size and free of significant  disease.  4. Right coronary artery: The vessel is small in size and  nondominant. It has mild diffuse atherosclerosis but no  obstructive disease.  STUDY CONCLUSIONS:  1. Severe single-vessel coronary artery disease with a 90% eccentric  stenosis in the mid left anterior descending artery.  2. Normal LV systolic function.  3. Recommend urgent angioplasty and stent placement to the left  anterior descending artery.   08/2013 Exercise Cardiolite exercise cardiolite from 08/2013 from prior cardiologist. 6 minutes with no ischemic changes, 87%THR, LVEF 58%, moderate size mild intensity partially reversible inferior defect thought to be soft tissue attenuation but cannot rule out RCA ischemia   05/2014 Echo [facility] ECHOCARDIOGRAM: 05/30/2014 NORMAL LEFT VENTRICULAR SYSTOLIC FUNCTION WITH MODERATE LVH NORMAL LA PRESSURES WITH DIASTOLIC DYSFUNCTION NORMAL RIGHT VENTRICULAR SYSTOLIC FUNCTION NO VALVULAR REGURGITATION NO VALVULAR STENOSIS   05/2014 Cath Cardiac Catheterization: 05/30/2014 Diagnostic Findings Coronary  arteries Dominance: left Left main: insignificant LAD: insignificant LCx: insignificant RCA: not injected Left Ventricle Catheterization EF: 55% Wall Motion: Anterior: Normal Apical: Normal Inferior: Normal Septal: Normal Lateral: Normal Impressions 1. Right radial 2. LAD wih mild - moderate atherosclerosis - 40% in-stent 3. LCX dominant with 20-30% 4. RCA not injected Recommendations 1. No significant CAD 2. LV normal  Jan 2016 nuclear stress [name] Negative for ischemia  08/2013 nuclear stress test [place] Mild inferior ischemia  01/2017 echo Study Conclusions  - Left ventricle: The cavity size was mildly dilated. Wall   thickness was normal. Systolic function was normal. The estimated   ejection fraction was in the range of 55% to 60%. Wall motion was   normal; there were no regional wall motion abnormalities. Doppler   parameters are consistent with abnormal left ventricular   relaxation (grade 1 diastolic dysfunction). Doppler parameters   are consistent with high ventricular filling pressure. - Mitral valve: There was mild regurgitation.  Assessment and Plan  1. Chest pain/CAD - ongoing chest pain, we will plan for a lexiscan to evalaute for ischemia.  - start imdur 15mg  daily as antianginal  2. LE edema/SOB - increased edema and weight gain. Echo with normal LVEF, grade I diasotlic dysfunction - continue lasix 60mg  bid, check BMET/Mg   3. HTN - bp is at goal, continue current meds  4. Hyperlipidemia - he will continue current statin   F/u 1 month   Arnoldo Lenis, M.D.

## 2017-02-13 ENCOUNTER — Encounter (INDEPENDENT_AMBULATORY_CARE_PROVIDER_SITE_OTHER)
Admission: RE | Admit: 2017-02-13 | Discharge: 2017-02-13 | Disposition: A | Discharge status: Home / Self Care | Payer: Medicare Other | Source: Ambulatory Visit | Attending: Cardiology | Admitting: Cardiology

## 2017-02-13 ENCOUNTER — Encounter (HOSPITAL_COMMUNITY)
Admission: RE | Admit: 2017-02-13 | Discharge: 2017-02-13 | Disposition: A | Discharge status: Home / Self Care | Payer: Medicare Other | Source: Ambulatory Visit | Attending: Cardiology | Admitting: Cardiology

## 2017-02-13 ENCOUNTER — Encounter (HOSPITAL_COMMUNITY): Payer: Self-pay

## 2017-02-13 DIAGNOSIS — R079 Chest pain, unspecified: Secondary | ICD-10-CM | POA: Insufficient documentation

## 2017-02-13 HISTORY — DX: Unspecified asthma, uncomplicated: J45.909

## 2017-02-13 LAB — NM MYOCAR MULTI W/SPECT W/WALL MOTION / EF
CHL CUP NUCLEAR SRS: 0
CHL CUP NUCLEAR SSS: 1
CHL CUP RESTING HR STRESS: 63 {beats}/min
LHR: 0.46
LV sys vol: 59 mL
LVDIAVOL: 148 mL (ref 62–150)
NUC STRESS TID: 1.15
Peak HR: 81 {beats}/min
SDS: 1

## 2017-02-13 MED ORDER — REGADENOSON 0.4 MG/5ML IV SOLN
INTRAVENOUS | Status: AC
  Administered 2017-02-13: 0.4 mg via INTRAVENOUS
  Filled 2017-02-13: qty 5

## 2017-02-13 MED ORDER — TECHNETIUM TC 99M TETROFOSMIN IV KIT
10.0000 | PACK | Freq: Once | INTRAVENOUS | Status: AC | PRN
  Administered 2017-02-13: 11 via INTRAVENOUS

## 2017-02-13 MED ORDER — SODIUM CHLORIDE 0.9% FLUSH
INTRAVENOUS | Status: AC
  Administered 2017-02-13: 10 mL via INTRAVENOUS
  Filled 2017-02-13: qty 10

## 2017-02-13 MED ORDER — TECHNETIUM TC 99M TETROFOSMIN IV KIT
30.0000 | PACK | Freq: Once | INTRAVENOUS | Status: AC | PRN
  Administered 2017-02-13: 33 via INTRAVENOUS

## 2017-02-15 ENCOUNTER — Telehealth: Payer: Self-pay

## 2017-02-15 NOTE — Telephone Encounter (Signed)
-----   Message from Arnoldo Lenis, MD sent at 02/14/2017  4:27 PM EDT ----- Stress test overall looks good. There is some mild shadowing on the bottom of the heart that likely is a normal area but could represent a very small area of blockage, this is conisdered low risk and something we would just work with medicines for. How are his symptoms of chest pain doing?  Zandra Abts MD

## 2017-02-15 NOTE — Telephone Encounter (Signed)
Patient notified. Patient reports chest pain has been some better.

## 2017-03-01 ENCOUNTER — Ambulatory Visit (INDEPENDENT_AMBULATORY_CARE_PROVIDER_SITE_OTHER): Payer: Medicare Other | Admitting: Cardiology

## 2017-03-01 ENCOUNTER — Encounter: Payer: Self-pay | Admitting: Cardiology

## 2017-03-01 VITALS — BP 104/65 | HR 80 | Ht 69.0 in | Wt 247.0 lb

## 2017-03-01 DIAGNOSIS — E782 Mixed hyperlipidemia: Secondary | ICD-10-CM

## 2017-03-01 DIAGNOSIS — R600 Localized edema: Secondary | ICD-10-CM | POA: Diagnosis not present

## 2017-03-01 DIAGNOSIS — I10 Essential (primary) hypertension: Secondary | ICD-10-CM | POA: Diagnosis not present

## 2017-03-01 DIAGNOSIS — R0789 Other chest pain: Secondary | ICD-10-CM

## 2017-03-01 DIAGNOSIS — I2510 Atherosclerotic heart disease of native coronary artery without angina pectoris: Secondary | ICD-10-CM

## 2017-03-01 NOTE — Progress Notes (Signed)
Clinical Summary [name] is a 71 y.o.male seen today for follow up of the following medical problems.   1. CAD  - hx of BMS to LAD in 2003  - cath 01/2011 with LM patent, LAD with prox stent mild ISR, patent mid stent, 90% mid LAD lesion. LCX 30% prox, OM2 30-40%, RCA small non-dom with no disease. Received additional stent to LAD at that time, a septal branch was jailed. LVEF 65% by LV gram.  - exercise cardiolite from 08/2013 from prior cardiologist. 6 minutes with no ischemic changes, 87%THR, LVEF 58%, moderate size mild intensity partially reversible inferior defect thought to be soft tissue attenuation but cannot rule out RCA ischemia -[name] admit with chest pain 04/2014. Sharp pain mid to left chest, 10/10. No other associated symptoms. Not positional. Better with NG. Symptoms would last just a few minutes at at time. Lexiscan MPI during admission with no ischemia, LVEF 60%. 05/2014 cath at [facility] patent coronaries    -01/2017 echo: LVEF 16-10%, grade I diastolic dysfunction - still with some chest pain at times - cannot run treadmill due to knee pain.   01/2017 nuclear stress: probable normal study, cannot exclude small distal inferior ischemia. Low risk - last visit we started imdur 15mg  daily. Rare infrequent chest pain since that time  2. LE swelling - lasix increased to 60mg  bid recently.  - continued leg swelling at times, weights are trending down.    3. HTN - compliant with meds  4. Hyperlipidemia - compliant with statin   Past Medical History:  Diagnosis Date  . Asthma   . Coronary atherosclerosis of native coronary artery  04/22/2001      Cardiac Catheterization  . Diverticulitis   . Encounter for long-term (current) use of insulin ([place])   . Esophageal reflux   . Gout, unspecified   . Old myocardial infarction   . Personal history of tobacco use, presenting hazards to health   . Postsurgical percutaneous transluminal coronary angioplasty  status   . Pure hypercholesterolemia   . 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 Unspecified essential hypertension      No Known Allergies   Current Outpatient Medications  Medication Sig Dispense Refill  . allopurinol (ZYLOPRIM) 300 MG tablet Take 300 mg by mouth daily.    Marland Kitchen ALPRAZolam (XANAX) 0.5 MG tablet Take 0.5 mg by mouth at bedtime as needed for anxiety.    Marland Kitchen amLODipine (NORVASC) 10 MG tablet Take 1 tablet (10 mg total) by mouth daily. 30 tablet 6  . aspirin 81 MG tablet Take 81 mg by mouth daily.      Marland Kitchen atorvastatin (LIPITOR) 80 MG tablet TAKE 1 TABLET (80 MG TOTAL) BY MOUTH DAILY. (STOP SIMVASTATIN) 30 tablet 6  . budesonide-formoterol (SYMBICORT) 160-4.5 MCG/ACT inhaler Inhale 2 puffs into the lungs 2 (two) times daily.    . carvedilol (COREG) 25 MG tablet Take 25 mg by mouth 2 (two) times daily with a meal.      . diclofenac (VOLTAREN) 75 MG EC tablet Take 75 mg by mouth daily.    . fexofenadine (ALLEGRA) 180 MG tablet Take 180 mg by mouth daily.      . furosemide (LASIX) 40 MG tablet Take 1.5 tablets (60 mg total) by mouth 2 (two) times daily. 90 tablet 3  . gabapentin (NEURONTIN) 300 MG capsule Take 300 mg by mouth daily.    Marland Kitchen  glimepiride (AMARYL) 4 MG tablet Take 4 mg by mouth daily with breakfast.    . insulin regular (NOVOLIN R,HUMULIN R) 100 units/mL injection Inject into the skin 3 (three) times daily before meals.    Marland Kitchen ipratropium (ATROVENT HFA) 17 MCG/ACT inhaler Inhale 2 puffs into the lungs as needed for wheezing.    . isosorbide mononitrate (IMDUR) 30 MG 24 hr tablet Take 0.5 tablets (15 mg total) by mouth daily. 45 tablet 1  . Ketoprofen 10 % CREA by Does not apply route.    . Liraglutide (VICTOZA) 18 MG/3ML SOPN Inject into the skin daily.    Marland Kitchen lisinopril (PRINIVIL,ZESTRIL) 40 MG tablet Take 1 tablet (40 mg total) by mouth daily. 90 tablet 3  . meclizine (ANTIVERT) 25 MG tablet  Take 25 mg by mouth 3 (three) times daily as needed for dizziness.    . metFORMIN (GLUCOPHAGE) 1000 MG tablet Take 1,000 mg by mouth 2 (two) times daily.      . montelukast (SINGULAIR) 10 MG tablet Take 10 mg by mouth at bedtime.    . Multiple Vitamins-Minerals (CENTRUM PO) Take by mouth daily.      . nitroGLYCERIN (NITROSTAT) 0.4 MG SL tablet Place 1 tablet (0.4 mg total) under the tongue every 5 (five) minutes as needed for chest pain. 25 tablet 3  . pantoprazole (PROTONIX) 40 MG tablet Take 40 mg by mouth daily.      . repaglinide (PRANDIN) 1 MG tablet Take 1 mg by mouth 3 (three) times daily before meals.    Marland Kitchen zolpidem (AMBIEN) 10 MG tablet Take 10 mg by mouth at bedtime as needed for sleep. Take 0.25 tablet at bedtime     No current facility-administered medications for this visit.      Past Surgical History:  Procedure Laterality Date  . 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 CARDIAC CATHETERIZATION  2003   70% proximal LAD (plaque rupture), 40 % mid. Left dominent  . CARDIAC CATHETERIZATION  01/26/2011   LAD: Patent proximal stent. 90% calcified eccentic stenosis in med segment, mild LCX disease  . CAROTID STENTS    . CORONARY ANGIOPLASTY WITH STENT PLACEMENT  2003   Prox LAD: 3.0 X12 BMS  . CORONARY ANGIOPLASTY WITH STENT PLACEMENT  01/26/2011   Mid LAD: 3.0 X15 mm Vision BMS. Complicated by a jailed septal branch and periprocedural MI  . HEMORRHOID SURGERY    . HERNIA REPAIR       No Known Allergies    Family History  Problem Relation Age of Onset  . Coronary artery disease Unknown        Family History     Social History Mr. Littler reports that he quit smoking about 31 years ago. His smoking use included cigarettes. He started smoking about 57 years ago. He has a 39.00 pack-year smoking history. He quit smokeless tobacco use about 31 years ago. His smokeless tobacco use included snuff and chew. Mr. Misiaszek reports that he drinks  alcohol.   Review of Systems CONSTITUTIONAL: No weight loss, fever, chills, weakness or fatigue.  HEENT: Eyes: No visual loss, blurred vision, double vision or yellow sclerae.No hearing loss, sneezing, congestion, runny nose or sore throat.  SKIN: No rash or itching.  CARDIOVASCULAR: per hpi RESPIRATORY: No shortness of breath, cough or sputum.  GASTROINTESTINAL: No anorexia, nausea, vomiting or diarrhea. No abdominal pain or blood.  GENITOURINARY: No burning on urination, no polyuria NEUROLOGICAL: No headache, dizziness, syncope,  paralysis, ataxia, numbness or tingling in the extremities. No change in bowel or bladder control.  MUSCULOSKELETAL: No muscle, back pain, joint pain or stiffness.  LYMPHATICS: No enlarged nodes. No history of splenectomy.  PSYCHIATRIC: No history of depression or anxiety.  ENDOCRINOLOGIC: No reports of sweating, cold or heat intolerance. No polyuria or polydipsia.  Marland Kitchen   Physical Examination Vitals:   03/01/17 1348  BP: 104/65  Pulse: 80   Vitals:   03/01/17 1348  Weight: 247 lb (112 kg)  Height: 5\' 9"  (1.753 m)    Gen: resting comfortably, no acute distress HEENT: no scleral icterus, pupils equal round and reactive, no palptable cervical adenopathy,  CV: RRR, no m/rg, no jvd Resp: Clear to auscultation bilaterally GI: abdomen is soft, non-tender, non-distended, normal bowel sounds, no hepatosplenomegaly MSK: extremities are warm, no edema.  Skin: warm, no rash Neuro:  no focal deficits Psych: appropriate affect   Diagnostic Studies Cath 01/2011 STUDY FINDINGS: Hemodynamic findings: Left ventricular pressure is  176/17, with a left ventricular end-diastolic pressure of 19 mmHg.  Aortic pressure is 175/75 with a mean pressure of 118 mmHg.  Left ventricular angiography: This showed normal LV systolic function  and wall motion with an estimated ejection fraction of 65%. No  significant mitral regurgitation is noted.  Coronary  angiography:  1. Left main coronary artery: The vessel is normal in size with minor  irregularities but no evidence of obstructive disease.  2. Left circumflex artery: The vessel is very large in size and  dominant. It is mildly calcified in the proximal segment with a  30% proximal stenosis. The rest of the circumflex has minor  irregularities. OM-1 is a very small-sized branch. OM-2 is normal  in size with 30-40% ostial stenosis. OM-3 is normal in size with  mild diffuse 20% disease. The posterior AV groove is a large size  branch that gives PDA and posterolateral branches. All have mild  diffuse atherosclerosis but no evidence of obstructive disease.  3. Left anterior descending artery: The vessel is large in size and  wraps around the apex distally. A stent is noted proximally with  minimal in-stent restenosis. It appears that there is another  stent in the mid segment which is also patent with minimal  restenosis. In the mid LAD between the 2 segments, there seems to  be a severe 90% eccentric stenosis. The rest of the LAD has mild  atherosclerosis but no evidence of obstructive disease. Thediagonal branches are medium in size and free of significant  disease.  4. Right coronary artery: The vessel is small in size and  nondominant. It has mild diffuse atherosclerosis but no  obstructive disease.  STUDY CONCLUSIONS:  1. Severe single-vessel coronary artery disease with a 90% eccentric  stenosis in the mid left anterior descending artery.  2. Normal LV systolic function.  3. Recommend urgent angioplasty and stent placement to the left  anterior descending artery.   08/2013 Exercise Cardiolite exercise cardiolite from 08/2013 from prior cardiologist. 6 minutes with no ischemic changes, 87%THR, LVEF 58%, moderate size mild intensity partially reversible inferior defect thought to be soft tissue attenuation but cannot rule out RCA ischemia   05/2014 Echo  [facility] ECHOCARDIOGRAM: 05/30/2014 NORMAL LEFT VENTRICULAR SYSTOLIC FUNCTION WITH MODERATE LVH NORMAL LA PRESSURES WITH DIASTOLIC DYSFUNCTION NORMAL RIGHT VENTRICULAR SYSTOLIC FUNCTION NO VALVULAR REGURGITATION NO VALVULAR STENOSIS   05/2014 Cath Cardiac Catheterization: 05/30/2014 Diagnostic Findings Coronary arteries Dominance: left Left main: insignificant LAD: insignificant LCx: insignificant RCA: not injected Left  Ventricle Catheterization EF: 55% Wall Motion: Anterior: Normal Apical: Normal Inferior: Normal Septal: Normal Lateral: Normal Impressions 1. Right radial 2. LAD wih mild - moderate atherosclerosis - 40% in-stent 3. LCX dominant with 20-30% 4. RCA not injected Recommendations 1. No significant CAD 2. LV normal    Jan2016 nuclear stress [name] Negative for ischemia  08/2013 nuclear stress test [place] Mild inferior ischemia  01/2017 echo Study Conclusions  - Left ventricle: The cavity size was mildly dilated. Wall thickness was normal. Systolic function was normal. The estimated ejection fraction was in the range of 55% to 60%. Wall motion was normal; there were no regional wall motion abnormalities. Doppler parameters are consistent with abnormal left ventricular relaxation (grade 1 diastolic dysfunction). Doppler parameters are consistent with high ventricular filling pressure. - Mitral valve: There was mild regurgitation.  01/2017 nuclear stress  Probable normal perfusion with soft tissue attenuation (diaphragm) , cannot exclude small region of distal inferior ischemia  Nuclear stress EF: 60%.  This is a low risk study.  Assessment and Plan   1. Chest pain/CAD - recent stress test small distal ischmemia vs artifact, overall low risk - no recurrent symptoms, continue current meds  2. LE edema/SOB - increased edema and weight gain. Echo with normal LVEF, grade I diasotlic dysfunction - we will continue  diuretic  3. HTN - at goal, continue current meds  4. Hyperlipidemia - continue statin  F/u 2 months   Arnoldo Lenis, M.D.

## 2017-03-01 NOTE — Patient Instructions (Signed)
Medication Instructions:  Continue all current medications.  Labwork: none  Testing/Procedures: none  Follow-Up: 2 months   Any Other Special Instructions Will Be Listed Below (If Applicable).  If you need a refill on your cardiac medications before your next appointment, please call your pharmacy.  

## 2017-03-05 ENCOUNTER — Encounter: Payer: Self-pay | Admitting: Cardiology

## 2017-05-03 ENCOUNTER — Ambulatory Visit: Payer: Medicare Other | Admitting: Cardiology

## 2017-05-26 ENCOUNTER — Ambulatory Visit: Payer: Medicare Other | Admitting: Cardiology

## 2017-05-26 NOTE — Progress Notes (Deleted)
Clinical Summary Mr. Dutton is a 72 y.o.male seen today for follow up of the following medical problems.   1. CAD  - hx of BMS to LAD in 2003  - cath 01/2011 with LM patent, LAD with prox stent mild ISR, patent mid stent, 90% mid LAD lesion. LCX 30% prox, OM2 30-40%, RCA small non-dom with no disease. Received additional stent to LAD at that time, a septal Dolly Harbach was jailed. LVEF 65% by LV gram.  - exercise cardiolite from 08/2013 from prior cardiologist. 6 minutes with no ischemic changes, 87%THR, LVEF 58%, moderate size mild intensity partially reversible inferior defect thought to be soft tissue attenuation but cannot rule out RCA ischemia -[name] admit with chest pain 04/2014. Sharp pain mid to left chest, 10/10. No other associated symptoms. Not positional. Better with NG. Symptoms would last just a few minutes at at time. Lexiscan MPI during admission with no ischemia, LVEF 60%. 05/2014 cath at [facility] patent coronaries    -01/2017 echo: LVEF 47-65%, grade I diastolic dysfunction - still with some chest painat times - cannot run treadmill due to knee pain.  01/2017 nuclear stress: probable normal study, cannot exclude small distal inferior ischemia. Low risk - last visit we started imdur 15mg  daily. Rare infrequent chest pain since that time  2. LE swelling - lasix increased to 60mg  bid recently.  - continued leg swelling at times, weights are trending down.    3. HTN -compliant with meds  4. Hyperlipidemia - compliant with statin    Past Medical History:  Diagnosis Date  . Asthma   . Coronary atherosclerosis of native coronary artery  04/22/2001      Cardiac Catheterization  . Diverticulitis   . Encounter for long-term (current) use of insulin ([place])   . Esophageal reflux   . Gout, unspecified   . Old myocardial infarction   . Personal history of tobacco use, presenting hazards to health   . Postsurgical percutaneous transluminal coronary  angioplasty status   . Pure hypercholesterolemia   . 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 Unspecified essential hypertension      No Known Allergies   Current Outpatient Medications  Medication Sig Dispense Refill  . allopurinol (ZYLOPRIM) 300 MG tablet Take 300 mg by mouth daily.    Marland Kitchen ALPRAZolam (XANAX) 0.5 MG tablet Take 0.5 mg by mouth at bedtime as needed for anxiety.    Marland Kitchen amLODipine (NORVASC) 10 MG tablet Take 1 tablet (10 mg total) by mouth daily. 30 tablet 6  . aspirin 81 MG tablet Take 81 mg by mouth daily.      Marland Kitchen atorvastatin (LIPITOR) 80 MG tablet TAKE 1 TABLET (80 MG TOTAL) BY MOUTH DAILY. (STOP SIMVASTATIN) 30 tablet 6  . budesonide-formoterol (SYMBICORT) 160-4.5 MCG/ACT inhaler Inhale 2 puffs into the lungs 2 (two) times daily.    . carvedilol (COREG) 25 MG tablet Take 25 mg by mouth 2 (two) times daily with a meal.      . diclofenac (VOLTAREN) 75 MG EC tablet Take 75 mg by mouth daily.    . fexofenadine (ALLEGRA) 180 MG tablet Take 180 mg by mouth daily.      . furosemide (LASIX) 40 MG tablet Take 1.5 tablets (60 mg total) by mouth 2 (two) times daily. 90 tablet 3  . gabapentin (NEURONTIN) 300 MG capsule Take 300 mg by mouth daily.    Marland Kitchen glimepiride (  AMARYL) 4 MG tablet Take 4 mg by mouth daily with breakfast.    . insulin regular (NOVOLIN R,HUMULIN R) 100 units/mL injection Inject into the skin 3 (three) times daily before meals.    Marland Kitchen ipratropium (ATROVENT HFA) 17 MCG/ACT inhaler Inhale 2 puffs into the lungs as needed for wheezing.    . isosorbide mononitrate (IMDUR) 30 MG 24 hr tablet Take 0.5 tablets (15 mg total) by mouth daily. 45 tablet 1  . Ketoprofen 10 % CREA by Does not apply route.    . Liraglutide (VICTOZA) 18 MG/3ML SOPN Inject into the skin daily.    Marland Kitchen lisinopril (PRINIVIL,ZESTRIL) 40 MG tablet Take 1 tablet (40 mg total) by mouth daily. 90 tablet 3  . meclizine (ANTIVERT) 25  MG tablet Take 25 mg by mouth 3 (three) times daily as needed for dizziness.    . metFORMIN (GLUCOPHAGE) 1000 MG tablet Take 1,000 mg by mouth 2 (two) times daily.      . montelukast (SINGULAIR) 10 MG tablet Take 10 mg by mouth at bedtime.    . Multiple Vitamins-Minerals (CENTRUM PO) Take by mouth daily.      . nitroGLYCERIN (NITROSTAT) 0.4 MG SL tablet Place 1 tablet (0.4 mg total) under the tongue every 5 (five) minutes as needed for chest pain. 25 tablet 3  . pantoprazole (PROTONIX) 40 MG tablet Take 40 mg by mouth daily.      . repaglinide (PRANDIN) 1 MG tablet Take 1 mg by mouth 3 (three) times daily before meals.    Marland Kitchen zolpidem (AMBIEN) 10 MG tablet Take 10 mg by mouth at bedtime as needed for sleep. Take 0.25 tablet at bedtime     No current facility-administered medications for this visit.      Past Surgical History:  Procedure Laterality Date  . 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 CARDIAC CATHETERIZATION  2003   70% proximal LAD (plaque rupture), 40 % mid. Left dominent  . CARDIAC CATHETERIZATION  01/26/2011   LAD: Patent proximal stent. 90% calcified eccentic stenosis in med segment, mild LCX disease  . CAROTID STENTS    . CORONARY ANGIOPLASTY WITH STENT PLACEMENT  2003   Prox LAD: 3.0 X12 BMS  . CORONARY ANGIOPLASTY WITH STENT PLACEMENT  01/26/2011   Mid LAD: 3.0 X15 mm Vision BMS. Complicated by a jailed septal Juancarlos Crescenzo and periprocedural MI  . HEMORRHOID SURGERY    . HERNIA REPAIR       No Known Allergies    Family History  Problem Relation Age of Onset  . Coronary artery disease Unknown        Family History     Social History Mr. Rafanan reports that he quit smoking about 32 years ago. His smoking use included cigarettes. He started smoking about 57 years ago. He has a 39.00 pack-year smoking history. He quit smokeless tobacco use about 32 years ago. His smokeless tobacco use included snuff and chew. Mr. Mcclaine reports that he  drinks alcohol.   Review of Systems CONSTITUTIONAL: No weight loss, fever, chills, weakness or fatigue.  HEENT: Eyes: No visual loss, blurred vision, double vision or yellow sclerae.No hearing loss, sneezing, congestion, runny nose or sore throat.  SKIN: No rash or itching.  CARDIOVASCULAR:  RESPIRATORY: No shortness of breath, cough or sputum.  GASTROINTESTINAL: No anorexia, nausea, vomiting or diarrhea. No abdominal pain or blood.  GENITOURINARY: No burning on urination, no polyuria NEUROLOGICAL: No headache, dizziness, syncope, paralysis, ataxia,  numbness or tingling in the extremities. No change in bowel or bladder control.  MUSCULOSKELETAL: No muscle, back pain, joint pain or stiffness.  LYMPHATICS: No enlarged nodes. No history of splenectomy.  PSYCHIATRIC: No history of depression or anxiety.  ENDOCRINOLOGIC: No reports of sweating, cold or heat intolerance. No polyuria or polydipsia.  Marland Kitchen   Physical Examination There were no vitals filed for this visit. There were no vitals filed for this visit.  Gen: resting comfortably, no acute distress HEENT: no scleral icterus, pupils equal round and reactive, no palptable cervical adenopathy,  CV Resp: Clear to auscultation bilaterally GI: abdomen is soft, non-tender, non-distended, normal bowel sounds, no hepatosplenomegaly MSK: extremities are warm, no edema.  Skin: warm, no rash Neuro:  no focal deficits Psych: appropriate affect   Diagnostic Studies  Cath 01/2011 STUDY FINDINGS: Hemodynamic findings: Left ventricular pressure is  176/17, with a left ventricular end-diastolic pressure of 19 mmHg.  Aortic pressure is 175/75 with a mean pressure of 118 mmHg.  Left ventricular angiography: This showed normal LV systolic function  and wall motion with an estimated ejection fraction of 65%. No  significant mitral regurgitation is noted.  Coronary angiography:  1. Left main coronary artery: The vessel is normal in size  with minor  irregularities but no evidence of obstructive disease.  2. Left circumflex artery: The vessel is very large in size and  dominant. It is mildly calcified in the proximal segment with a  30% proximal stenosis. The rest of the circumflex has minor  irregularities. OM-1 is a very small-sized Icey Tello. OM-2 is normal  in size with 30-40% ostial stenosis. OM-3 is normal in size with  mild diffuse 20% disease. The posterior AV groove is a large size  Vivan Agostino that gives PDA and posterolateral branches. All have mild  diffuse atherosclerosis but no evidence of obstructive disease.  3. Left anterior descending artery: The vessel is large in size and  wraps around the apex distally. A stent is noted proximally with  minimal in-stent restenosis. It appears that there is another  stent in the mid segment which is also patent with minimal  restenosis. In the mid LAD between the 2 segments, there seems to  be a severe 90% eccentric stenosis. The rest of the LAD has mild  atherosclerosis but no evidence of obstructive disease. Thediagonal branches are medium in size and free of significant  disease.  4. Right coronary artery: The vessel is small in size and  nondominant. It has mild diffuse atherosclerosis but no  obstructive disease.  STUDY CONCLUSIONS:  1. Severe single-vessel coronary artery disease with a 90% eccentric  stenosis in the mid left anterior descending artery.  2. Normal LV systolic function.  3. Recommend urgent angioplasty and stent placement to the left  anterior descending artery.   08/2013 Exercise Cardiolite exercise cardiolite from 08/2013 from prior cardiologist. 6 minutes with no ischemic changes, 87%THR, LVEF 58%, moderate size mild intensity partially reversible inferior defect thought to be soft tissue attenuation but cannot rule out RCA ischemia   05/2014 Echo [facility] ECHOCARDIOGRAM: 05/30/2014 NORMAL LEFT VENTRICULAR SYSTOLIC FUNCTION  WITH MODERATE LVH NORMAL LA PRESSURES WITH DIASTOLIC DYSFUNCTION NORMAL RIGHT VENTRICULAR SYSTOLIC FUNCTION NO VALVULAR REGURGITATION NO VALVULAR STENOSIS   05/2014 Cath Cardiac Catheterization: 05/30/2014 Diagnostic Findings Coronary arteries Dominance: left Left main: insignificant LAD: insignificant LCx: insignificant RCA: not injected Left Ventricle Catheterization EF: 55% Wall Motion: Anterior: Normal Apical: Normal Inferior: Normal Septal: Normal Lateral: Normal Impressions 1. Right radial 2. LAD  wih mild - moderate atherosclerosis - 40% in-stent 3. LCX dominant with 20-30% 4. RCA not injected Recommendations 1. No significant CAD 2. LV normal    Jan2016 nuclear stress [name] Negative for ischemia  08/2013 nuclear stress test [place] Mild inferior ischemia  01/2017 echo Study Conclusions  - Left ventricle: The cavity size was mildly dilated. Wall thickness was normal. Systolic function was normal. The estimated ejection fraction was in the range of 55% to 60%. Wall motion was normal; there were no regional wall motion abnormalities. Doppler parameters are consistent with abnormal left ventricular relaxation (grade 1 diastolic dysfunction). Doppler parameters are consistent with high ventricular filling pressure. - Mitral valve: There was mild regurgitation.  01/2017 nuclear stress  Probable normal perfusion with soft tissue attenuation (diaphragm) , cannot exclude small region of distal inferior ischemia  Nuclear stress EF: 60%.  This is a low risk study.    Assessment and Plan  1. Chest pain/CAD - recent stress test small distal ischmemia vs artifact, overall low risk -no recurrent symptoms, continue current meds  2. LE edema/SOB -increased edema and weight gain. Echo with normal LVEF, grade I diasotlic dysfunction - we will continue diuretic  3. HTN -at goal, continue current meds  4. Hyperlipidemia -  continue statin  F/u 2 months        Arnoldo Lenis, M.D., F.A.C.C.

## 2017-05-29 ENCOUNTER — Encounter: Payer: Self-pay | Admitting: Cardiology

## 2017-08-04 ENCOUNTER — Encounter: Payer: Self-pay | Admitting: *Deleted

## 2017-08-07 ENCOUNTER — Encounter: Payer: Self-pay | Admitting: Cardiology

## 2017-08-07 ENCOUNTER — Ambulatory Visit (INDEPENDENT_AMBULATORY_CARE_PROVIDER_SITE_OTHER): Payer: Medicare Other | Admitting: Cardiology

## 2017-08-07 VITALS — BP 120/78 | HR 81 | Ht 70.0 in | Wt 250.0 lb

## 2017-08-07 DIAGNOSIS — I25119 Atherosclerotic heart disease of native coronary artery with unspecified angina pectoris: Secondary | ICD-10-CM

## 2017-08-07 DIAGNOSIS — I10 Essential (primary) hypertension: Secondary | ICD-10-CM | POA: Diagnosis not present

## 2017-08-07 DIAGNOSIS — E782 Mixed hyperlipidemia: Secondary | ICD-10-CM | POA: Diagnosis not present

## 2017-08-07 DIAGNOSIS — R600 Localized edema: Secondary | ICD-10-CM | POA: Diagnosis not present

## 2017-08-07 MED ORDER — ISOSORBIDE MONONITRATE ER 30 MG PO TB24: 30.0000 mg | ORAL_TABLET | Freq: Every day | ORAL | 1 refills | Status: DC

## 2017-08-07 MED ORDER — FUROSEMIDE 80 MG PO TABS: 80.0000 mg | ORAL_TABLET | Freq: Two times a day (BID) | ORAL | 1 refills | Status: DC

## 2017-08-07 NOTE — Patient Instructions (Signed)
Your physician wants you to follow-up in: [place] will receive a reminder letter in the mail two months in advance. If you don't receive a letter, please call our office to schedule the follow-up appointment.  Your physician has recommended you make the following change in your medication:   INCREASE IMDUR 30 MG DAILY  INCREASE LASIX 79 MG TWICE DAILY  Your physician recommends that you return for lab work in: 2 WEEKS BMP/MG  Thank you for choosing [facility]!!

## 2017-08-07 NOTE — Progress Notes (Signed)
Clinical Summary [name] is a 72 y.o.male seen today for follow up of the following medical problems.   1. CAD  - hx of BMS to LAD in 2003  - cath 01/2011 with LM patent, LAD with prox stent mild ISR, patent mid stent, 90% mid LAD lesion. LCX 30% prox, OM2 30-40%, RCA small non-dom with no disease. Received additional stent to LAD at that time, a septal [name] was jailed. LVEF 65% by LV gram.  - exercise cardiolite from 08/2013 from prior cardiologist. 6 minutes with no ischemic changes, 87%THR, LVEF 58%, moderate size mild intensity partially reversible inferior defect thought to be soft tissue attenuation but cannot rule out RCA ischemia -[name] admit with chest pain 04/2014. Sharp pain mid to left chest, 10/10. No other associated symptoms. Not positional. Better with NG. Symptoms would last just a few minutes at at time. Lexiscan MPI during admission with no ischemia, LVEF 60%. 05/2014 cath at [facility] patent coronaries -01/2017 echo: LVEF 08-65%, grade I diastolic dysfunction [id number] nuclear stress: probable normal study, cannot exclude small distal inferior ischemia. Low risk  - mild chest pain at times. Have improved some since starting low dose imudr  2. LE swelling - lcontinued leg swelling, not improved on lasix 60mg  bid   3. HTN -he is compliant with meds  4. Hyperlipidemia - he is compliant with statin  5. COPD - night time oxygen.  - followed by Dr Clydene Pugh   Past Medical History:  Diagnosis Date  . Asthma   . Coronary atherosclerosis of native coronary artery  04/22/2001      Cardiac Catheterization  . Diverticulitis   . Encounter for long-term (current) use of insulin ([place])   . Esophageal reflux   . Gout, unspecified   . Old myocardial infarction   . Personal history of tobacco use, presenting hazards to health   . Postsurgical percutaneous transluminal coronary angioplasty status   . Pure hypercholesterolemia   . Type II or unspecified type  diabetes mellitus without mention of complication, not stated as uncontrolled    TYPE 11  . Unspecified essential hypertension      No Known Allergies   Current Outpatient Medications  Medication Sig Dispense Refill  . allopurinol (ZYLOPRIM) 300 MG tablet Take 300 mg by mouth daily.    Marland Kitchen ALPRAZolam (XANAX) 0.5 MG tablet Take 0.5 mg by mouth at bedtime as needed for anxiety.    Marland Kitchen amLODipine (NORVASC) 10 MG tablet Take 1 tablet (10 mg total) by mouth daily. 30 tablet 6  . aspirin 81 MG tablet Take 81 mg by mouth daily.      Marland Kitchen atorvastatin (LIPITOR) 80 MG tablet TAKE 1 TABLET (80 MG TOTAL) BY MOUTH DAILY. (STOP SIMVASTATIN) 30 tablet 6  . budesonide-formoterol (SYMBICORT) 160-4.5 MCG/ACT inhaler Inhale 2 puffs into the lungs 2 (two) times daily.    . carvedilol (COREG) 25 MG tablet Take 25 mg by mouth 2 (two) times daily with a meal.      . diclofenac (VOLTAREN) 75 MG EC tablet Take 75 mg by mouth daily.    . fexofenadine (ALLEGRA) 180 MG tablet Take 180 mg by mouth daily.      . furosemide (LASIX) 40 MG tablet Take 1.5 tablets (60 mg total) by mouth 2 (two) times daily. 90 tablet 3  . gabapentin (NEURONTIN) 300 MG capsule Take 300 mg by mouth daily.    Marland Kitchen glimepiride (AMARYL) 4 MG tablet Take 4 mg by mouth daily with breakfast.    .  insulin regular (NOVOLIN R,HUMULIN R) 100 units/mL injection Inject into the skin 3 (three) times daily before meals.    Marland Kitchen ipratropium (ATROVENT HFA) 17 MCG/ACT inhaler Inhale 2 puffs into the lungs as needed for wheezing.    . isosorbide mononitrate (IMDUR) 30 MG 24 hr tablet Take 0.5 tablets (15 mg total) by mouth daily. 45 tablet 1  . Ketoprofen 10 % CREA by Does not apply route.    . Liraglutide (VICTOZA) 18 MG/3ML SOPN Inject into the skin daily.    Marland Kitchen lisinopril (PRINIVIL,ZESTRIL) 40 MG tablet Take 1 tablet (40 mg total) by mouth daily. 90 tablet 3  . meclizine (ANTIVERT) 25 MG tablet Take 25 mg by mouth 3 (three) times daily as needed for dizziness.    .  metFORMIN (GLUCOPHAGE) 1000 MG tablet Take 1,000 mg by mouth 2 (two) times daily.      . montelukast (SINGULAIR) 10 MG tablet Take 10 mg by mouth at bedtime.    . Multiple Vitamins-Minerals (CENTRUM PO) Take by mouth daily.      . nitroGLYCERIN (NITROSTAT) 0.4 MG SL tablet Place 1 tablet (0.4 mg total) under the tongue every 5 (five) minutes as needed for chest pain. 25 tablet 3  . pantoprazole (PROTONIX) 40 MG tablet Take 40 mg by mouth daily.      . repaglinide (PRANDIN) 1 MG tablet Take 1 mg by mouth 3 (three) times daily before meals.    Marland Kitchen zolpidem (AMBIEN) 10 MG tablet Take 10 mg by mouth at bedtime as needed for sleep. Take 0.25 tablet at bedtime     No current facility-administered medications for this visit.      Past Surgical History:  Procedure Laterality Date  . 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 CARDIAC CATHETERIZATION  2003   70% proximal LAD (plaque rupture), 40 % mid. Left dominent  . CARDIAC CATHETERIZATION  01/26/2011   LAD: Patent proximal stent. 90% calcified eccentic stenosis in med segment, mild LCX disease  . CAROTID STENTS    . CORONARY ANGIOPLASTY WITH STENT PLACEMENT  2003   Prox LAD: 3.0 X12 BMS  . CORONARY ANGIOPLASTY WITH STENT PLACEMENT  01/26/2011   Mid LAD: 3.0 X15 mm Vision BMS. Complicated by a jailed septal Neo Yepiz and periprocedural MI  . HEMORRHOID SURGERY    . HERNIA REPAIR       No Known Allergies    Family History  Problem Relation Age of Onset  . Coronary artery disease Unknown        Family History     Social History [name] reports that he quit smoking about 32 years ago. His smoking use included cigarettes. He started smoking about 58 years ago. He has a 39.00 pack-year smoking history. He quit smokeless tobacco use about 32 years ago. His smokeless tobacco use included snuff and chew. [name] reports that he drinks alcohol.   Review of Systems CONSTITUTIONAL: No weight loss, fever,  chills, weakness or fatigue.  HEENT: Eyes: No visual loss, blurred vision, double vision or yellow sclerae.No hearing loss, sneezing, congestion, runny nose or sore throat.  SKIN: No rash or itching.  CARDIOVASCULAR: per hpi RESPIRATORY: No shortness of breath, cough or sputum.  GASTROINTESTINAL: No anorexia, nausea, vomiting or diarrhea. No abdominal pain or blood.  GENITOURINARY: No burning on urination, no polyuria NEUROLOGICAL: No headache, dizziness, syncope, paralysis, ataxia, numbness or tingling in the extremities. No change in bowel or bladder control.  MUSCULOSKELETAL:  No muscle, back pain, joint pain or stiffness.  LYMPHATICS: No enlarged nodes. No history of splenectomy.  PSYCHIATRIC: No history of depression or anxiety.  ENDOCRINOLOGIC: No reports of sweating, cold or heat intolerance. No polyuria or polydipsia.  Marland Kitchen   Physical Examination Vitals:   08/07/17 1458  BP: 120/78  Pulse: 81  SpO2: 93%   Vitals:   08/07/17 1458  Weight: 250 lb (113.4 kg)  Height: 5\' 10"  (1.778 m)    Gen: resting comfortably, no acute distress HEENT: no scleral icterus, pupils equal round and reactive, no palptable cervical adenopathy,  CV: RRR, no m/r/g, no jvd Resp: Clear to auscultation bilaterally GI: abdomen is soft, non-tender, non-distended, normal bowel sounds, no hepatosplenomegaly MSK: extremities are warm, 1-2+ bilatearl LE edema Skin: warm, no rash Neuro:  no focal deficits Psych: appropriate affect   Diagnostic Studies  Cath 01/2011 STUDY FINDINGS: Hemodynamic findings: Left ventricular pressure is  176/17, with a left ventricular end-diastolic pressure of 19 mmHg.  Aortic pressure is 175/75 with a mean pressure of 118 mmHg.  Left ventricular angiography: This showed normal LV systolic function  and wall motion with an estimated ejection fraction of 65%. No  significant mitral regurgitation is noted.  Coronary angiography:  1. Left main coronary artery: The  vessel is normal in size with minor  irregularities but no evidence of obstructive disease.  2. Left circumflex artery: The vessel is very large in size and  dominant. It is mildly calcified in the proximal segment with a  30% proximal stenosis. The rest of the circumflex has minor  irregularities. OM-1 is a very small-sized Monserratt Knezevic. OM-2 is normal  in size with 30-40% ostial stenosis. OM-3 is normal in size with  mild diffuse 20% disease. The posterior AV groove is a large size  Diamante Truszkowski that gives PDA and posterolateral branches. All have mild  diffuse atherosclerosis but no evidence of obstructive disease.  3. Left anterior descending artery: The vessel is large in size and  wraps around the apex distally. A stent is noted proximally with  minimal in-stent restenosis. It appears that there is another  stent in the mid segment which is also patent with minimal  restenosis. In the mid LAD between the 2 segments, there seems to  be a severe 90% eccentric stenosis. The rest of the LAD has mild  atherosclerosis but no evidence of obstructive disease. Thediagonal branches are medium in size and free of significant  disease.  4. Right coronary artery: The vessel is small in size and  nondominant. It has mild diffuse atherosclerosis but no  obstructive disease.  STUDY CONCLUSIONS:  1. Severe single-vessel coronary artery disease with a 90% eccentric  stenosis in the mid left anterior descending artery.  2. Normal LV systolic function.  3. Recommend urgent angioplasty and stent placement to the left  anterior descending artery.   08/2013 Exercise Cardiolite exercise cardiolite from 08/2013 from prior cardiologist. 6 minutes with no ischemic changes, 87%THR, LVEF 58%, moderate size mild intensity partially reversible inferior defect thought to be soft tissue attenuation but cannot rule out RCA ischemia   05/2014 Echo [facility] ECHOCARDIOGRAM: 05/30/2014 NORMAL LEFT  VENTRICULAR SYSTOLIC FUNCTION WITH MODERATE LVH NORMAL LA PRESSURES WITH DIASTOLIC DYSFUNCTION NORMAL RIGHT VENTRICULAR SYSTOLIC FUNCTION NO VALVULAR REGURGITATION NO VALVULAR STENOSIS   05/2014 Cath Cardiac Catheterization: 05/30/2014 Diagnostic Findings Coronary arteries Dominance: left Left main: insignificant LAD: insignificant LCx: insignificant RCA: not injected Left Ventricle Catheterization EF: 55% Wall Motion: Anterior: Normal Apical: Normal Inferior: Normal  Septal: Normal Lateral: Normal Impressions 1. Right radial 2. LAD wih mild - moderate atherosclerosis - 40% in-stent 3. LCX dominant with 20-30% 4. RCA not injected Recommendations 1. No significant CAD 2. LV normal    Jan2016 nuclear stress [name] Negative for ischemia  08/2013 nuclear stress test [place] Mild inferior ischemia  01/2017 echo Study Conclusions  - Left ventricle: The cavity size was mildly dilated. Wall thickness was normal. Systolic function was normal. The estimated ejection fraction was in the range of 55% to 60%. Wall motion was normal; there were no regional wall motion abnormalities. Doppler parameters are consistent with abnormal left ventricular relaxation (grade 1 diastolic dysfunction). Doppler parameters are consistent with high ventricular filling pressure. - Mitral valve: There was mild regurgitation.  01/2017 nuclear stress  Probable normal perfusion with soft tissue attenuation (diaphragm) , cannot exclude small region of distal inferior ischemia  Nuclear stress EF: 60%.  This is a low risk study.     Assessment and Plan  1. Chest pain/CAD - recent stress test small distal ischmemia vs artifact, overall low risk -still with some symptoms at times. We will increase imdur to 30mg  daily.   2. LE edema/SOB -i Echo with normal LVEF, grade I diasotlic dysfunction - ongoing edema, increase lasix to 80mg  bid and recheck BMET/Mg in 2  weeks  3. HTN -at goal, continue current meds  4. Hyperlipidemia - he will continue statin  F/u 6 months  Request labs from pcp     [name], M.D.,

## 2017-08-09 ENCOUNTER — Encounter: Payer: Self-pay | Admitting: Cardiology

## 2017-08-09 ENCOUNTER — Encounter: Payer: Self-pay | Admitting: *Deleted

## 2018-02-21 ENCOUNTER — Other Ambulatory Visit: Payer: Self-pay | Admitting: Cardiology

## 2018-07-24 ENCOUNTER — Telehealth: Payer: Self-pay | Admitting: *Deleted

## 2018-07-24 NOTE — Telephone Encounter (Signed)
   Primary Cardiologist:  No primary care provider on file.   Patient contacted.  History reviewed.  No symptoms to suggest any unstable cardiac conditions.  Based on discussion, with current pandemic situation, we will be postponing this appointment for [name] with a plan for f/u in June 2020 or sooner if feasible/necessary.  If symptoms change, he has been instructed to contact our office.    Marlou Sa, RN  07/24/2018 8:38 AM         .

## 2018-07-27 ENCOUNTER — Ambulatory Visit: Payer: Medicare Other | Admitting: Cardiology

## 2018-09-18 ENCOUNTER — Other Ambulatory Visit: Payer: Self-pay | Admitting: Cardiology

## 2018-10-03 ENCOUNTER — Other Ambulatory Visit (HOSPITAL_COMMUNITY): Payer: Self-pay | Admitting: Internal Medicine

## 2018-10-03 ENCOUNTER — Other Ambulatory Visit: Payer: Self-pay | Admitting: Internal Medicine

## 2018-10-03 DIAGNOSIS — R9389 Abnormal findings on diagnostic imaging of other specified body structures: Secondary | ICD-10-CM

## 2018-10-08 ENCOUNTER — Ambulatory Visit: Payer: Medicare Other | Admitting: Cardiology

## 2018-10-18 ENCOUNTER — Ambulatory Visit (HOSPITAL_COMMUNITY)
Admission: RE | Admit: 2018-10-18 | Discharge: 2018-10-18 | Disposition: A | Discharge status: Home / Self Care | Payer: Medicare Other | Source: Ambulatory Visit | Attending: Internal Medicine | Admitting: Internal Medicine

## 2018-10-18 ENCOUNTER — Other Ambulatory Visit: Payer: Self-pay

## 2018-10-18 DIAGNOSIS — R9389 Abnormal findings on diagnostic imaging of other specified body structures: Secondary | ICD-10-CM | POA: Diagnosis present

## 2018-10-28 ENCOUNTER — Other Ambulatory Visit: Payer: Self-pay | Admitting: Cardiology

## 2018-12-17 ENCOUNTER — Telehealth: Payer: Self-pay | Admitting: Cardiology

## 2018-12-17 NOTE — Telephone Encounter (Signed)
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     Fax # [phone]

## 2018-12-17 NOTE — Telephone Encounter (Signed)
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

## 2018-12-18 NOTE — Telephone Encounter (Signed)
Pt agreeable for tomorrow at 1240 pm for telehealth appt - will have vitals

## 2018-12-18 NOTE — Telephone Encounter (Signed)
Has been almost a year and a half since I last saw him, would need to see him prior to giving note. If 9/11 is too long can do a virtual this week at [place] [facility]

## 2018-12-19 ENCOUNTER — Encounter: Payer: Self-pay | Admitting: Cardiology

## 2018-12-19 ENCOUNTER — Telehealth (INDEPENDENT_AMBULATORY_CARE_PROVIDER_SITE_OTHER): Payer: Medicare Other | Admitting: Cardiology

## 2018-12-19 VITALS — BP 138/80 | HR 84 | Temp 98.0°F | Ht 70.0 in | Wt 240.0 lb

## 2018-12-19 DIAGNOSIS — I25119 Atherosclerotic heart disease of native coronary artery with unspecified angina pectoris: Secondary | ICD-10-CM | POA: Diagnosis not present

## 2018-12-19 DIAGNOSIS — I10 Essential (primary) hypertension: Secondary | ICD-10-CM

## 2018-12-19 DIAGNOSIS — R600 Localized edema: Secondary | ICD-10-CM

## 2018-12-19 DIAGNOSIS — E782 Mixed hyperlipidemia: Secondary | ICD-10-CM

## 2018-12-19 MED ORDER — TORSEMIDE 20 MG PO TABS: 40.0000 mg | ORAL_TABLET | Freq: Every day | ORAL | 3 refills | Status: DC

## 2018-12-19 NOTE — Progress Notes (Signed)
Virtual Visit via Telephone Note   This visit type was conducted due to national recommendations for restrictions regarding the COVID-19 Pandemic (e.g. social distancing) in an effort to limit this patient's exposure and mitigate transmission in our community.  Due to his co-morbid illnesses, this patient is at least at moderate risk for complications without adequate follow up.  This format is felt to be most appropriate for this patient at this time.  The patient did not have access to video technology/had technical difficulties with video requiring transitioning to audio format only (telephone).  All issues noted in this document were discussed and addressed.  No physical exam could be performed with this format.  Please refer to the patient's chart for his  consent to telehealth for [facility].   Date:  12/19/2018   ID:  [name], DOB 08-Aug-1945, MRN [id number]  Patient Location: Home Provider Location: Office  PCP:  No primary care provider on file.  Cardiologist:  Dr Carlyle Dolly MD Electrophysiologist:  None   Evaluation Performed:  Follow-Up Visit  Chief Complaint:  Follow up  History of Present Illness:    [name] is a 73 y.o. male seen today for follow up of the following medical problems.  1. CAD  - hx of BMS to LAD in 2003  - cath 01/2011 with LM patent, LAD with prox stent mild ISR, patent mid stent, 90% mid LAD lesion. LCX 30% prox, OM2 30-40%, RCA small non-dom with no disease. Received additional stent to LAD at that time, a septal Jahari Wiginton was jailed. LVEF 65% by LV gram.  - exercise cardiolite from 08/2013 from prior cardiologist. 6 minutes with no ischemic changes, 87%THR, LVEF 58%, moderate size mild intensity partially reversible inferior defect thought to be soft tissue attenuation but cannot rule out RCA ischemia -[name] admit with chest pain 04/2014. Sharp pain mid to left chest, 10/10. No other associated symptoms. Not positional. Better  with NG. Symptoms would last just a few minutes at at time. Lexiscan MPI during admission with no ischemia, LVEF 60%. 05/2014 cath at [facility] patent coronaries -01/2017 echo: LVEF 0000000, grade I diastolic dysfunction   [id number] nuclear stress: probable normal study, cannot exclude small distal inferior ischemia. Low risk    - no recent chest pain. No SOB or DOE - compliant with meds  - does heavy works clearing trees, tolerates without troubles.   2. LE swelling - ongoing swelling at times, resolves at night. Wears compressoin stockings.     3. HTN -checks 3 times a week, 130s-140s/80s.    4. Hyperlipidemia - compliant with statin   5. COPD - night time oxygen.  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    Past Medical History:  Diagnosis Date  . Asthma   . Coronary atherosclerosis of native coronary artery  04/22/2001      Cardiac Catheterization  . Diverticulitis   . Encounter for long-term (current) use of insulin ([place])   . Esophageal reflux   . Gout, unspecified   . Old myocardial infarction   . Personal history of tobacco use, presenting hazards to health   . Postsurgical percutaneous transluminal coronary angioplasty status   . Pure hypercholesterolemia   . Type II or unspecified type diabetes mellitus without mention of complication, not stated as uncontrolled  TYPE 11  . Unspecified essential hypertension    Past Surgical History:  Procedure Laterality Date  . 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 CARDIAC CATHETERIZATION  2003   70% proximal LAD (plaque rupture), 40 % mid. Left dominent  . CARDIAC CATHETERIZATION  01/26/2011   LAD: Patent proximal stent. 90% calcified eccentic stenosis in med segment, mild LCX disease  . CAROTID STENTS    .  CORONARY ANGIOPLASTY WITH STENT PLACEMENT  2003   Prox LAD: 3.0 X12 BMS  . CORONARY ANGIOPLASTY WITH STENT PLACEMENT  01/26/2011   Mid LAD: 3.0 X15 mm Vision BMS. Complicated by a jailed septal Dymond Spreen and periprocedural MI  . HEMORRHOID SURGERY    . HERNIA REPAIR       Current Meds  Medication Sig  . allopurinol (ZYLOPRIM) 300 MG tablet Take 300 mg by mouth daily.  Marland Kitchen ALPRAZolam (XANAX) 0.5 MG tablet Take 0.5 mg by mouth at bedtime as needed for anxiety.  Marland Kitchen amLODipine (NORVASC) 10 MG tablet Take 1 tablet (10 mg total) by mouth daily.  Marland Kitchen aspirin 81 MG tablet Take 81 mg by mouth daily.    Marland Kitchen atorvastatin (LIPITOR) 80 MG tablet TAKE 1 TABLET (80 MG TOTAL) BY MOUTH DAILY. (STOP SIMVASTATIN)  . budesonide-formoterol (SYMBICORT) 160-4.5 MCG/ACT inhaler Inhale 2 puffs into the lungs 2 (two) times daily.  . carvedilol (COREG) 25 MG tablet Take 25 mg by mouth 2 (two) times daily with a meal.    . diclofenac (VOLTAREN) 75 MG EC tablet Take 75 mg by mouth daily.  . fexofenadine (ALLEGRA) 180 MG tablet Take 180 mg by mouth daily.    . furosemide (LASIX) 80 MG tablet TAKE 1 TABLET (80 MG TOTAL) BY MOUTH TWICE DAILY  . gabapentin (NEURONTIN) 300 MG capsule Take 300 mg by mouth daily.  Marland Kitchen glimepiride (AMARYL) 4 MG tablet Take 4 mg by mouth daily with breakfast.  . ipratropium (ATROVENT HFA) 17 MCG/ACT inhaler Inhale 2 puffs into the lungs as needed for wheezing.  . isosorbide mononitrate (IMDUR) 30 MG 24 hr tablet TAKE 1 TABLET (30 MG TOTAL) BY MOUTH DAILY  . Ketoprofen 10 % CREA by Does not apply route.  . Liraglutide (VICTOZA) 18 MG/3ML SOPN Inject into the skin daily.  Marland Kitchen lisinopril (PRINIVIL,ZESTRIL) 40 MG tablet Take 1 tablet (40 mg total) by mouth daily.  . metFORMIN (GLUCOPHAGE) 500 MG tablet Take 500 mg by mouth 2 (two) times daily.  . montelukast (SINGULAIR) 10 MG tablet Take 10 mg by mouth at bedtime.  . Multiple Vitamins-Minerals (CENTRUM PO) Take by mouth daily.    . nitroGLYCERIN (NITROSTAT)  0.4 MG SL tablet Place 1 tablet (0.4 mg total) under the tongue every 5 (five) minutes as needed for chest pain.  . OXYGEN Inhale 2 L/min into the lungs at bedtime.  . pantoprazole (PROTONIX) 40 MG tablet Take 40 mg by mouth daily.    . repaglinide (PRANDIN) 1 MG tablet Take 1 mg by mouth 3 (three) times daily before meals.  . sodium chloride (OCEAN) 0.65 % nasal spray Place 1 spray into the nose as needed for congestion.  Marland Kitchen zolpidem (AMBIEN) 10 MG tablet Take 10 mg by mouth at bedtime as needed for sleep. Take 0.25 tablet at bedtime     Allergies:   Patient has no known allergies.   Social History   Tobacco Use  . Smoking status: Former Smoker    Packs/day: 1.50    Years:  26.00    Pack years: 39.00    Types: Cigarettes    Start date: 07/16/1959    Quit date: 04/18/1985    Years since quitting: 33.6  . Smokeless tobacco: Former User    Types: Snuff, Sarina Ser    Quit date: 04/18/1985  Substance Use Topics  . Alcohol use: Yes    Alcohol/week: 0.0 standard drinks    Comment: Occasionaly  . Drug use: No     Family Hx: The patient's family history includes Coronary artery disease in his unknown relative.  ROS:   Please see the history of present illness.     All other systems reviewed and are negative.   Prior CV studies:   The following studies were reviewed today:  Cath 01/2011 STUDY FINDINGS: Hemodynamic findings: Left ventricular pressure is  176/17, with a left ventricular end-diastolic pressure of 19 mmHg.  Aortic pressure is 175/75 with a mean pressure of 118 mmHg.  Left ventricular angiography: This showed normal LV systolic function  and wall motion with an estimated ejection fraction of 65%. No  significant mitral regurgitation is noted.  Coronary angiography:  1. Left main coronary artery: The vessel is normal in size with minor  irregularities but no evidence of obstructive disease.  2. Left circumflex artery: The vessel is very large in size and   dominant. It is mildly calcified in the proximal segment with a  30% proximal stenosis. The rest of the circumflex has minor  irregularities. OM-1 is a very small-sized Jyquan Kenley. OM-2 is normal  in size with 30-40% ostial stenosis. OM-3 is normal in size with  mild diffuse 20% disease. The posterior AV groove is a large size  Sonali Wivell that gives PDA and posterolateral branches. All have mild  diffuse atherosclerosis but no evidence of obstructive disease.  3. Left anterior descending artery: The vessel is large in size and  wraps around the apex distally. A stent is noted proximally with  minimal in-stent restenosis. It appears that there is another  stent in the mid segment which is also patent with minimal  restenosis. In the mid LAD between the 2 segments, there seems to  be a severe 90% eccentric stenosis. The rest of the LAD has mild  atherosclerosis but no evidence of obstructive disease. Thediagonal branches are medium in size and free of significant  disease.  4. Right coronary artery: The vessel is small in size and  nondominant. It has mild diffuse atherosclerosis but no  obstructive disease.  STUDY CONCLUSIONS:  1. Severe single-vessel coronary artery disease with a 90% eccentric  stenosis in the mid left anterior descending artery.  2. Normal LV systolic function.  3. Recommend urgent angioplasty and stent placement to the left  anterior descending artery.   08/2013 Exercise Cardiolite exercise cardiolite from 08/2013 from prior cardiologist. 6 minutes with no ischemic changes, 87%THR, LVEF 58%, moderate size mild intensity partially reversible inferior defect thought to be soft tissue attenuation but cannot rule out RCA ischemia   05/2014 Echo [facility] ECHOCARDIOGRAM: 05/30/2014 NORMAL LEFT VENTRICULAR SYSTOLIC FUNCTION WITH MODERATE LVH NORMAL LA PRESSURES WITH DIASTOLIC DYSFUNCTION NORMAL RIGHT VENTRICULAR SYSTOLIC FUNCTION NO VALVULAR REGURGITATION  NO VALVULAR STENOSIS   05/2014 Cath Cardiac Catheterization: 05/30/2014 Diagnostic Findings Coronary arteries Dominance: left Left main: insignificant LAD: insignificant LCx: insignificant RCA: not injected Left Ventricle Catheterization EF: 55% Wall Motion: Anterior: Normal Apical: Normal Inferior: Normal Septal: Normal Lateral: Normal Impressions 1. Right radial 2. LAD wih mild - moderate atherosclerosis - 40% in-stent 3.  LCX dominant with 20-30% 4. RCA not injected Recommendations 1. No significant CAD 2. LV normal    Jan2016 nuclear stress [name] Negative for ischemia  08/2013 nuclear stress test [place] Mild inferior ischemia  01/2017 echo Study Conclusions  - Left ventricle: The cavity size was mildly dilated. Wall thickness was normal. Systolic function was normal. The estimated ejection fraction was in the range of 55% to 60%. Wall motion was normal; there were no regional wall motion abnormalities. Doppler parameters are consistent with abnormal left ventricular relaxation (grade 1 diastolic dysfunction). Doppler parameters are consistent with high ventricular filling pressure. - Mitral valve: There was mild regurgitation.  01/2017 nuclear stress  Probable normal perfusion with soft tissue attenuation (diaphragm) , cannot exclude small region of distal inferior ischemia  Nuclear stress EF: 60%.  This is a low risk study.  Labs/Other Tests and Data Reviewed:    EKG:  No ECG reviewed.  Recent Labs: No results found for requested labs within last 8760 hours.   Recent Lipid Panel No results found for: CHOL, TRIG, HDL, CHOLHDL, LDLCALC, LDLDIRECT  Wt Readings from Last 3 Encounters:  12/19/18 240 lb (108.9 kg)  08/07/17 250 lb (113.4 kg)  03/01/17 247 lb (112 kg)     Objective:    Vital Signs:  BP 138/80   Pulse 84   Temp 98 F (36.7 C)   Ht 5\' 10"  (1.778 m)   Wt 240 lb (108.9 kg)   BMI 34.44 kg/m    Normal  affect. Normal speech pattern and tone. Comfortable, no apparent distress. No audible signs of SOB or wheezing.  ASSESSMENT & PLAN:    1. CAD - no symptoms, continue current meds  2. LE edema/SOB -prior Echo with normal LVEF, grade I diasotlic dysfunction - ongoign edema, we will stop lasix and start torsemide 40mg  bid. He will call if he wants [place] to order his labs or his pcp who he plans to see soon  3. HTN - above goal, may improve with increased diuresis alone with change to torsemide, continue to monitor.   4. 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     COVID-19 Education: The signs and symptoms of COVID-19 were discussed with the patient and how to seek care for testing (follow up with PCP or arrange E-visit).  The importance of social distancing was discussed today.  Time:   Today, I have spent 22 minutes with the patient with telehealth technology discussing the above problems.     Medication Adjustments/Labs and Tests Ordered: Current medicines are reviewed at length with the patient today.  Concerns regarding medicines are outlined above.   Tests Ordered: No orders of the defined types were placed in this encounter.   Medication Changes: No orders of the defined types were placed in this encounter.   Follow Up:  In Person in 6 month(s)  Signed, Carlyle Dolly, MD  12/19/2018 11:55 AM    [place]

## 2018-12-19 NOTE — Patient Instructions (Addendum)
Medication Instructions:   Your physician has recommended you make the following change in your medication:   Stop furosemide  Start torsemide 40 mg by mouth daily  Continue all other medications the same  Labwork:  NONE-please call our office tomorrow to let us know if you want [place] to get your lab work or your family doctor  Testing/Procedures:  NONE  Follow-Up:  Your physician recommends that you schedule a follow-up appointment in: 6 months. You will receive a reminder letter in the mail in about 4 months reminding you to call and schedule your appointment. If you don't receive this letter, please contact our office.   Any Other Special Instructions Will Be Listed Below (If Applicable). 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  If you need a refill on your cardiac medications before your next appointment, please call your pharmacy.

## 2018-12-20 ENCOUNTER — Telehealth: Payer: Self-pay | Admitting: Cardiology

## 2018-12-20 NOTE — Telephone Encounter (Signed)
Patient has A1C done with PCP

## 2018-12-20 NOTE — Telephone Encounter (Signed)
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

## 2018-12-28 ENCOUNTER — Ambulatory Visit: Payer: Medicare Other | Admitting: Cardiology

## 2019-03-18 ENCOUNTER — Other Ambulatory Visit: Payer: Self-pay

## 2019-03-18 ENCOUNTER — Emergency Department (HOSPITAL_COMMUNITY): Payer: Medicare Other

## 2019-03-18 ENCOUNTER — Inpatient Hospital Stay (HOSPITAL_COMMUNITY)
Admission: EM | Admit: 2019-03-18 | Discharge: 2019-03-23 | DRG: 481 | Disposition: A | Discharge status: Home Health | Payer: Medicare Other | Attending: Family Medicine | Admitting: Family Medicine

## 2019-03-18 ENCOUNTER — Encounter (HOSPITAL_COMMUNITY): Payer: Self-pay | Admitting: Emergency Medicine

## 2019-03-18 DIAGNOSIS — Z87891 Personal history of nicotine dependence: Secondary | ICD-10-CM

## 2019-03-18 DIAGNOSIS — M978XXA Periprosthetic fracture around other internal prosthetic joint, initial encounter: Secondary | ICD-10-CM

## 2019-03-18 DIAGNOSIS — R300 Dysuria: Secondary | ICD-10-CM | POA: Diagnosis present

## 2019-03-18 DIAGNOSIS — T148XXA Other injury of unspecified body region, initial encounter: Secondary | ICD-10-CM

## 2019-03-18 DIAGNOSIS — J449 Chronic obstructive pulmonary disease, unspecified: Secondary | ICD-10-CM | POA: Diagnosis present

## 2019-03-18 DIAGNOSIS — Z79899 Other long term (current) drug therapy: Secondary | ICD-10-CM | POA: Diagnosis not present

## 2019-03-18 DIAGNOSIS — Z952 Presence of prosthetic heart valve: Secondary | ICD-10-CM

## 2019-03-18 DIAGNOSIS — D696 Thrombocytopenia, unspecified: Secondary | ICD-10-CM | POA: Diagnosis present

## 2019-03-18 DIAGNOSIS — N179 Acute kidney failure, unspecified: Secondary | ICD-10-CM | POA: Diagnosis present

## 2019-03-18 DIAGNOSIS — E876 Hypokalemia: Secondary | ICD-10-CM | POA: Diagnosis present

## 2019-03-18 DIAGNOSIS — I10 Essential (primary) hypertension: Secondary | ICD-10-CM | POA: Diagnosis not present

## 2019-03-18 DIAGNOSIS — Z96659 Presence of unspecified artificial knee joint: Secondary | ICD-10-CM | POA: Diagnosis not present

## 2019-03-18 DIAGNOSIS — Z01810 Encounter for preprocedural cardiovascular examination: Secondary | ICD-10-CM | POA: Diagnosis not present

## 2019-03-18 DIAGNOSIS — I2510 Atherosclerotic heart disease of native coronary artery without angina pectoris: Secondary | ICD-10-CM

## 2019-03-18 DIAGNOSIS — E875 Hyperkalemia: Secondary | ICD-10-CM | POA: Diagnosis present

## 2019-03-18 DIAGNOSIS — M25552 Pain in left hip: Secondary | ICD-10-CM | POA: Diagnosis present

## 2019-03-18 DIAGNOSIS — I252 Old myocardial infarction: Secondary | ICD-10-CM

## 2019-03-18 DIAGNOSIS — E119 Type 2 diabetes mellitus without complications: Secondary | ICD-10-CM

## 2019-03-18 DIAGNOSIS — Z955 Presence of coronary angioplasty implant and graft: Secondary | ICD-10-CM | POA: Diagnosis not present

## 2019-03-18 DIAGNOSIS — I110 Hypertensive heart disease with heart failure: Secondary | ICD-10-CM | POA: Diagnosis present

## 2019-03-18 DIAGNOSIS — Z9981 Dependence on supplemental oxygen: Secondary | ICD-10-CM | POA: Diagnosis not present

## 2019-03-18 DIAGNOSIS — G4700 Insomnia, unspecified: Secondary | ICD-10-CM | POA: Diagnosis present

## 2019-03-18 DIAGNOSIS — K219 Gastro-esophageal reflux disease without esophagitis: Secondary | ICD-10-CM | POA: Diagnosis present

## 2019-03-18 DIAGNOSIS — J42 Unspecified chronic bronchitis: Secondary | ICD-10-CM | POA: Diagnosis not present

## 2019-03-18 DIAGNOSIS — E7800 Pure hypercholesterolemia, unspecified: Secondary | ICD-10-CM | POA: Diagnosis present

## 2019-03-18 DIAGNOSIS — S72452A Displaced supracondylar fracture without intracondylar extension of lower end of left femur, initial encounter for closed fracture: Principal | ICD-10-CM | POA: Diagnosis present

## 2019-03-18 DIAGNOSIS — E1142 Type 2 diabetes mellitus with diabetic polyneuropathy: Secondary | ICD-10-CM | POA: Diagnosis present

## 2019-03-18 DIAGNOSIS — G4733 Obstructive sleep apnea (adult) (pediatric): Secondary | ICD-10-CM | POA: Diagnosis present

## 2019-03-18 DIAGNOSIS — M9712XA Periprosthetic fracture around internal prosthetic left knee joint, initial encounter: Secondary | ICD-10-CM | POA: Diagnosis present

## 2019-03-18 DIAGNOSIS — Z791 Long term (current) use of non-steroidal anti-inflammatories (NSAID): Secondary | ICD-10-CM

## 2019-03-18 DIAGNOSIS — Z7984 Long term (current) use of oral hypoglycemic drugs: Secondary | ICD-10-CM | POA: Diagnosis not present

## 2019-03-18 DIAGNOSIS — D62 Acute posthemorrhagic anemia: Secondary | ICD-10-CM | POA: Diagnosis not present

## 2019-03-18 DIAGNOSIS — I5030 Unspecified diastolic (congestive) heart failure: Secondary | ICD-10-CM | POA: Diagnosis present

## 2019-03-18 DIAGNOSIS — Z7982 Long term (current) use of aspirin: Secondary | ICD-10-CM | POA: Diagnosis not present

## 2019-03-18 DIAGNOSIS — Y92018 Other place in single-family (private) house as the place of occurrence of the external cause: Secondary | ICD-10-CM | POA: Diagnosis not present

## 2019-03-18 DIAGNOSIS — M109 Gout, unspecified: Secondary | ICD-10-CM | POA: Diagnosis present

## 2019-03-18 DIAGNOSIS — M979XXA Periprosthetic fracture around unspecified internal prosthetic joint, initial encounter: Secondary | ICD-10-CM | POA: Diagnosis not present

## 2019-03-18 DIAGNOSIS — E8889 Other specified metabolic disorders: Secondary | ICD-10-CM | POA: Diagnosis present

## 2019-03-18 DIAGNOSIS — Z419 Encounter for procedure for purposes other than remedying health state, unspecified: Secondary | ICD-10-CM

## 2019-03-18 DIAGNOSIS — Z7951 Long term (current) use of inhaled steroids: Secondary | ICD-10-CM | POA: Diagnosis not present

## 2019-03-18 DIAGNOSIS — E1165 Type 2 diabetes mellitus with hyperglycemia: Secondary | ICD-10-CM | POA: Diagnosis not present

## 2019-03-18 DIAGNOSIS — Z20828 Contact with and (suspected) exposure to other viral communicable diseases: Secondary | ICD-10-CM | POA: Diagnosis present

## 2019-03-18 DIAGNOSIS — W1839XA Other fall on same level, initial encounter: Secondary | ICD-10-CM | POA: Diagnosis present

## 2019-03-18 DIAGNOSIS — E785 Hyperlipidemia, unspecified: Secondary | ICD-10-CM | POA: Diagnosis present

## 2019-03-18 LAB — CBC WITH DIFFERENTIAL/PLATELET
Abs Immature Granulocytes: 0.04 10*3/uL (ref 0.00–0.07)
Basophils Absolute: 0 10*3/uL (ref 0.0–0.1)
Basophils Relative: 0 %
Eosinophils Absolute: 0.2 10*3/uL (ref 0.0–0.5)
Eosinophils Relative: 1 %
HCT: 38.6 % — ABNORMAL LOW (ref 39.0–52.0)
Hemoglobin: 13.6 g/dL (ref 13.0–17.0)
Immature Granulocytes: 0 %
Lymphocytes Relative: 11 %
Lymphs Abs: 1.5 10*3/uL (ref 0.7–4.0)
MCH: 32.9 pg (ref 26.0–34.0)
MCHC: 35.2 g/dL (ref 30.0–36.0)
MCV: 93.5 fL (ref 80.0–100.0)
Monocytes Absolute: 1.2 10*3/uL — ABNORMAL HIGH (ref 0.1–1.0)
Monocytes Relative: 8 %
Neutro Abs: 11.2 10*3/uL — ABNORMAL HIGH (ref 1.7–7.7)
Neutrophils Relative %: 80 %
Platelets: 181 10*3/uL (ref 150–400)
RBC: 4.13 MIL/uL — ABNORMAL LOW (ref 4.22–5.81)
RDW: 13.2 % (ref 11.5–15.5)
WBC: 14.1 10*3/uL — ABNORMAL HIGH (ref 4.0–10.5)
nRBC: 0 % (ref 0.0–0.2)

## 2019-03-18 LAB — COMPREHENSIVE METABOLIC PANEL
ALT: 19 U/L (ref 0–44)
AST: 25 U/L (ref 15–41)
Albumin: 3.8 g/dL (ref 3.5–5.0)
Alkaline Phosphatase: 78 U/L (ref 38–126)
Anion gap: 12 (ref 5–15)
BUN: 12 mg/dL (ref 8–23)
CO2: 27 mmol/L (ref 22–32)
Calcium: 9 mg/dL (ref 8.9–10.3)
Chloride: 100 mmol/L (ref 98–111)
Creatinine, Ser: 1.25 mg/dL — ABNORMAL HIGH (ref 0.61–1.24)
GFR calc Af Amer: >60 mL/min (ref >60)
GFR calc non Af Amer: 58 mL/min — ABNORMAL LOW (ref >60)
Glucose, Bld: 326 mg/dL — ABNORMAL HIGH (ref 70–99)
Potassium: 3.4 mmol/L — ABNORMAL LOW (ref 3.5–5.1)
Sodium: 139 mmol/L (ref 135–145)
Total Bilirubin: 1 mg/dL (ref 0.3–1.2)
Total Protein: 7.3 g/dL (ref 6.5–8.1)

## 2019-03-18 LAB — CBG MONITORING, ED: Glucose-Capillary: 202 mg/dL — ABNORMAL HIGH (ref 70–99)

## 2019-03-18 LAB — HEMOGLOBIN A1C
Hgb A1c MFr Bld: 8.8 % — ABNORMAL HIGH (ref 4.8–5.6)
Mean Plasma Glucose: 205.86 mg/dL

## 2019-03-18 LAB — GLUCOSE, CAPILLARY: Glucose-Capillary: 342 mg/dL — ABNORMAL HIGH (ref 70–99)

## 2019-03-18 LAB — SARS CORONAVIRUS 2 (TAT 6-24 HRS): SARS Coronavirus 2: NEGATIVE

## 2019-03-18 LAB — SAMPLE TO BLOOD BANK

## 2019-03-18 MED ORDER — HYDROMORPHONE HCL 1 MG/ML IJ SOLN
0.5000 mg | Freq: Once | INTRAMUSCULAR | Status: AC
  Administered 2019-03-18: 12:00:00 0.5 mg via INTRAVENOUS
  Filled 2019-03-18: qty 1

## 2019-03-18 MED ORDER — ALLOPURINOL 300 MG PO TABS
300.0000 mg | ORAL_TABLET | Freq: Every morning | ORAL | Status: DC
  Administered 2019-03-18 – 2019-03-23 (×5): 300 mg via ORAL
  Filled 2019-03-18 (×6): qty 1

## 2019-03-18 MED ORDER — OXYCODONE HCL 5 MG PO TABS
5.0000 mg | ORAL_TABLET | Freq: Four times a day (QID) | ORAL | Status: DC | PRN
  Administered 2019-03-18: 5 mg via ORAL
  Filled 2019-03-18: qty 1

## 2019-03-18 MED ORDER — MORPHINE SULFATE (PF) 2 MG/ML IV SOLN: 2.0000 mg | INTRAVENOUS | Status: DC | PRN

## 2019-03-18 MED ORDER — ACETAMINOPHEN 325 MG PO TABS: 650.0000 mg | ORAL_TABLET | Freq: Four times a day (QID) | ORAL | Status: DC | PRN

## 2019-03-18 MED ORDER — INSULIN GLARGINE 100 UNIT/ML ~~LOC~~ SOLN
8.0000 [IU] | Freq: Once | SUBCUTANEOUS | Status: AC
  Administered 2019-03-18: 8 [IU] via SUBCUTANEOUS
  Filled 2019-03-18: qty 0.08

## 2019-03-18 MED ORDER — ACETAMINOPHEN 650 MG RE SUPP: 650.0000 mg | Freq: Four times a day (QID) | RECTAL | Status: DC | PRN

## 2019-03-18 MED ORDER — CEFAZOLIN SODIUM-DEXTROSE 2-4 GM/100ML-% IV SOLN
2.0000 g | INTRAVENOUS | Status: DC
  Filled 2019-03-18: qty 100

## 2019-03-18 MED ORDER — ONDANSETRON HCL 4 MG/2ML IJ SOLN: 4.0000 mg | Freq: Four times a day (QID) | INTRAMUSCULAR | Status: DC | PRN

## 2019-03-18 MED ORDER — ISOSORBIDE MONONITRATE ER 30 MG PO TB24
30.0000 mg | ORAL_TABLET | Freq: Every morning | ORAL | Status: DC
  Administered 2019-03-20 – 2019-03-23 (×4): 30 mg via ORAL
  Filled 2019-03-18 (×4): qty 1

## 2019-03-18 MED ORDER — FENTANYL CITRATE (PF) 100 MCG/2ML IJ SOLN
50.0000 ug | Freq: Once | INTRAMUSCULAR | Status: AC
  Administered 2019-03-18: 50 ug via INTRAVENOUS
  Filled 2019-03-18: qty 2

## 2019-03-18 MED ORDER — AMLODIPINE BESYLATE 5 MG PO TABS: 10.0000 mg | ORAL_TABLET | Freq: Every morning | ORAL | Status: DC

## 2019-03-18 MED ORDER — ZOLPIDEM TARTRATE 5 MG PO TABS: 10.0000 mg | ORAL_TABLET | Freq: Every day | ORAL | Status: DC

## 2019-03-18 MED ORDER — HEPARIN SODIUM (PORCINE) 5000 UNIT/ML IJ SOLN: 5000.0000 [IU] | Freq: Three times a day (TID) | INTRAMUSCULAR | Status: DC

## 2019-03-18 MED ORDER — HEPARIN SODIUM (PORCINE) 5000 UNIT/ML IJ SOLN
5000.0000 [IU] | Freq: Three times a day (TID) | INTRAMUSCULAR | Status: AC
  Administered 2019-03-18: 5000 [IU] via SUBCUTANEOUS
  Filled 2019-03-18: qty 1

## 2019-03-18 MED ORDER — TORSEMIDE 20 MG PO TABS: 20.0000 mg | ORAL_TABLET | Freq: Two times a day (BID) | ORAL | Status: DC

## 2019-03-18 MED ORDER — INSULIN ASPART 100 UNIT/ML ~~LOC~~ SOLN
0.0000 [IU] | Freq: Three times a day (TID) | SUBCUTANEOUS | Status: DC
  Administered 2019-03-19 (×2): 9 [IU] via SUBCUTANEOUS
  Administered 2019-03-20: 7 [IU] via SUBCUTANEOUS

## 2019-03-18 MED ORDER — MOMETASONE FURO-FORMOTEROL FUM 200-5 MCG/ACT IN AERO
2.0000 | INHALATION_SPRAY | Freq: Two times a day (BID) | RESPIRATORY_TRACT | Status: DC
  Administered 2019-03-20 – 2019-03-23 (×6): 2 via RESPIRATORY_TRACT
  Filled 2019-03-18 (×2): qty 8.8

## 2019-03-18 MED ORDER — POTASSIUM CHLORIDE CRYS ER 20 MEQ PO TBCR
40.0000 meq | EXTENDED_RELEASE_TABLET | Freq: Once | ORAL | Status: AC
  Administered 2019-03-18: 40 meq via ORAL
  Filled 2019-03-18: qty 2

## 2019-03-18 MED ORDER — INSULIN ASPART 100 UNIT/ML ~~LOC~~ SOLN: 0.0000 [IU] | Freq: Three times a day (TID) | SUBCUTANEOUS | Status: DC

## 2019-03-18 MED ORDER — GABAPENTIN 300 MG PO CAPS
600.0000 mg | ORAL_CAPSULE | Freq: Every day | ORAL | Status: DC
  Administered 2019-03-18 – 2019-03-22 (×5): 600 mg via ORAL
  Filled 2019-03-18 (×6): qty 2

## 2019-03-18 MED ORDER — PANTOPRAZOLE SODIUM 40 MG PO TBEC
40.0000 mg | DELAYED_RELEASE_TABLET | Freq: Every morning | ORAL | Status: DC
  Administered 2019-03-18 – 2019-03-23 (×5): 40 mg via ORAL
  Filled 2019-03-18 (×5): qty 1

## 2019-03-18 MED ORDER — ASPIRIN 81 MG PO CHEW
81.0000 mg | CHEWABLE_TABLET | Freq: Every day | ORAL | Status: DC
  Administered 2019-03-18 – 2019-03-23 (×5): 81 mg via ORAL
  Filled 2019-03-18 (×5): qty 1

## 2019-03-18 MED ORDER — CHLORHEXIDINE GLUCONATE 4 % EX LIQD
60.0000 mL | Freq: Once | CUTANEOUS | Status: AC
  Administered 2019-03-19: 4 via TOPICAL
  Filled 2019-03-18 (×2): qty 60

## 2019-03-18 MED ORDER — ONDANSETRON HCL 4 MG PO TABS: 4.0000 mg | ORAL_TABLET | Freq: Four times a day (QID) | ORAL | Status: DC | PRN

## 2019-03-18 MED ORDER — MONTELUKAST SODIUM 10 MG PO TABS
10.0000 mg | ORAL_TABLET | Freq: Every morning | ORAL | Status: DC
  Administered 2019-03-18 – 2019-03-23 (×5): 10 mg via ORAL
  Filled 2019-03-18 (×5): qty 1

## 2019-03-18 MED ORDER — OXYCODONE HCL 5 MG PO TABS
5.0000 mg | ORAL_TABLET | Freq: Four times a day (QID) | ORAL | Status: DC | PRN
  Administered 2019-03-19 – 2019-03-20 (×2): 10 mg via ORAL
  Filled 2019-03-18 (×2): qty 2

## 2019-03-18 MED ORDER — HYDROMORPHONE HCL 1 MG/ML IJ SOLN
0.5000 mg | Freq: Once | INTRAMUSCULAR | Status: AC
  Administered 2019-03-18: 0.5 mg via INTRAVENOUS
  Filled 2019-03-18: qty 1

## 2019-03-18 MED ORDER — POVIDONE-IODINE 10 % EX SWAB: 2.0000 "application " | Freq: Once | CUTANEOUS | Status: DC

## 2019-03-18 MED ORDER — MORPHINE SULFATE (PF) 2 MG/ML IV SOLN
2.0000 mg | Freq: Four times a day (QID) | INTRAVENOUS | Status: DC | PRN
  Administered 2019-03-18 – 2019-03-19 (×3): 2 mg via INTRAVENOUS
  Filled 2019-03-18 (×3): qty 1

## 2019-03-18 MED ORDER — CARVEDILOL 25 MG PO TABS
25.0000 mg | ORAL_TABLET | Freq: Two times a day (BID) | ORAL | Status: DC
  Administered 2019-03-18 – 2019-03-23 (×9): 25 mg via ORAL
  Filled 2019-03-18 (×8): qty 1
  Filled 2019-03-18: qty 2

## 2019-03-18 MED ORDER — ATORVASTATIN CALCIUM 80 MG PO TABS
80.0000 mg | ORAL_TABLET | Freq: Every day | ORAL | Status: DC
  Administered 2019-03-18 – 2019-03-22 (×5): 80 mg via ORAL
  Filled 2019-03-18 (×5): qty 1

## 2019-03-18 MED ORDER — CEFAZOLIN SODIUM-DEXTROSE 2-4 GM/100ML-% IV SOLN
2.0000 g | INTRAVENOUS | Status: AC
  Administered 2019-03-19: 2 g via INTRAVENOUS
  Filled 2019-03-18 (×2): qty 100

## 2019-03-18 MED ORDER — ISOSORBIDE MONONITRATE ER 30 MG PO TB24: 30.0000 mg | ORAL_TABLET | Freq: Every morning | ORAL | Status: DC

## 2019-03-18 NOTE — Consult Note (Signed)
Reason for Consult:Left distal femur fx Referring Physician: Robyn Haber  [name] is an 73 y.o. male.  HPI: [name] was on his porch this morning and misstepped. He fell onto the ground and had immediate left knee pain. He was unable to get up or bear weight but managed to drag himself back up onto the porch where he was able to summon help. He was brought to the ED where x-rays showed a periprosthetic distal femur fx and orthopedic surgery was consulted. 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  Past Medical History:  Diagnosis Date  . Asthma   . Coronary atherosclerosis of native coronary artery  04/22/2001      Cardiac Catheterization  . Diverticulitis   . Encounter for long-term (current) use of insulin ([place])   . Esophageal reflux   . Gout, unspecified   . Old myocardial infarction   . Personal history of tobacco use, presenting hazards to health   . Postsurgical percutaneous transluminal coronary angioplasty status   . Pure hypercholesterolemia   . 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 Unspecified essential hypertension     Past Surgical History:  Procedure Laterality Date  . 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 CARDIAC CATHETERIZATION  2003   70% proximal LAD (plaque rupture), 40 % mid. Left dominent  . CARDIAC CATHETERIZATION  01/26/2011   LAD: Patent proximal stent. 90% calcified eccentic stenosis in med segment, mild LCX disease  . CAROTID STENTS    . CORONARY ANGIOPLASTY WITH STENT PLACEMENT  2003   Prox LAD: 3.0 X12 BMS  . CORONARY ANGIOPLASTY WITH STENT PLACEMENT  01/26/2011   Mid LAD: 3.0 X15 mm Vision BMS. Complicated by a jailed septal branch and periprocedural MI  . HEMORRHOID SURGERY    . HERNIA REPAIR      Family History  Problem Relation Age of Onset  . Coronary artery disease  Other        Family History    Social History:  reports that he quit smoking about 33 years ago. His smoking use included cigarettes. He started smoking about 59 years ago. He has a 39.00 pack-year smoking history. He quit smokeless tobacco use about 33 years ago.  His smokeless tobacco use included snuff and chew. He reports current alcohol use. He reports that he does not use drugs.  Allergies: No Known Allergies  Medications: I have reviewed the patient's current medications.  Results for orders placed or performed during the hospital encounter of 03/18/19 (from the past 48 hour(s))  CBC with Differential     Status: Abnormal   Collection Time: 03/18/19 12:00 PM  Result Value Ref Range   WBC 14.1 (H) 4.0 - 10.5 K/uL   RBC 4.13 (L) 4.22 - 5.81 MIL/uL   Hemoglobin 13.6 13.0 - 17.0 g/dL   HCT 38.6 (L) 39.0 - 52.0 %   MCV 93.5 80.0 - 100.0 fL   MCH 32.9 26.0 - 34.0 pg   MCHC 35.2 30.0 - 36.0 g/dL   RDW 13.2 11.5 - 15.5 %   Platelets 181 150 - 400 K/uL   nRBC 0.0 0.0 - 0.2 %   Neutrophils Relative % 80 %   Neutro Abs 11.2 (H) 1.7 - 7.7 K/uL   Lymphocytes Relative 11 %   Lymphs Abs 1.5  0.7 - 4.0 K/uL   Monocytes Relative 8 %   Monocytes Absolute 1.2 (H) 0.1 - 1.0 K/uL   Eosinophils Relative 1 %   Eosinophils Absolute 0.2 0.0 - 0.5 K/uL   Basophils Relative 0 %   Basophils Absolute 0.0 0.0 - 0.1 K/uL   Immature Granulocytes 0 %   Abs Immature Granulocytes 0.04 0.00 - 0.07 K/uL    Comment: Performed at [facility] [address] [place], [place] [address]    Dg Knee Complete 4 Views Left  Result Date: 03/18/2019 CLINICAL DATA:  Fall.  Knee replacement 3 years ago EXAM: LEFT KNEE - COMPLETE 4+ VIEW COMPARISON:  None. FINDINGS: Left knee replacement. Fracture of the distal femur above the prosthesis with mild posterior displacement of the fracture. No fracture of the tibia or fibula. IMPRESSION: Knee replacement in satisfactory position. Displaced fracture distal femur  above the prosthesis. Electronically Signed   By: Franchot Gallo M.D.   On: 03/18/2019 11:32   Dg Femur Min 2 Views Left  Result Date: 03/18/2019 CLINICAL DATA:  Fall this morning EXAM: LEFT FEMUR 2 VIEWS COMPARISON:  None. FINDINGS: Partially visualized left total knee arthroplasty. Comminuted periprosthetic distal left femur fracture with mild impaction, apex anterior angulation and 8 mm posterior/lateral displacement of the dominant distal fracture fragment. No left hip dislocation. No suspicious focal osseous lesions. IMPRESSION: Partially visualized left total knee arthroplasty. Comminuted angulated impacted displaced periprosthetic distal left femur fracture as detailed Electronically Signed   By: Ilona Sorrel M.D.   On: 03/18/2019 11:33    Review of Systems  Constitutional: Negative for weight loss.  HENT: Negative for ear discharge, ear pain, hearing loss and tinnitus.   Eyes: Negative for blurred vision, double vision, photophobia and pain.  Respiratory: Negative for cough, sputum production and shortness of breath.   Cardiovascular: Negative for chest pain.  Gastrointestinal: Negative for abdominal pain, nausea and vomiting.  Genitourinary: Negative for dysuria, flank pain, frequency and urgency.  Musculoskeletal: Positive for joint pain (Left knee). Negative for back pain, falls, myalgias and neck pain.  Neurological: Negative for dizziness, tingling, sensory change, focal weakness, loss of consciousness and headaches.  Endo/Heme/Allergies: Does not bruise/bleed easily.  Psychiatric/Behavioral: Negative for depression, memory loss and substance abuse. The patient is not nervous/anxious.    Blood pressure 136/77, pulse 69, temperature 98.6 F (37 C), temperature source Oral, resp. rate 16, height 5\' 10"  (1.778 m), weight 114.3 kg, SpO2 99 %. Physical Exam  Constitutional: He appears well-developed and well-nourished. No distress.  HENT:  Head: Normocephalic and atraumatic.  Eyes:  Conjunctivae are normal. Right eye exhibits no discharge. Left eye exhibits no discharge. No scleral icterus.  Neck: Normal range of motion.  Cardiovascular: Normal rate and regular rhythm.  Respiratory: Effort normal. No respiratory distress.  Musculoskeletal:     Comments: LLE No traumatic wounds, ecchymosis, or rash  Severe TTP knee  No ankle effusion  Sens DPN, SPN, TN intact  Motor EHL, ext, flex, evers 5/5  DP 2+, PT 1+, No significant edema  Neurological: He is alert.  Skin: Skin is warm and dry. He is not diaphoretic.  Psychiatric: He has a normal mood and affect. His behavior is normal.    Assessment/Plan: Left periprosthetic distal femur fx -- Plan ORIF tomorrow by Dr. Marcelino Scot. Will need medical clearance prior.  Multiple medical problems including CAD s/p PCA, gout, DM, HLD, and HTN -- per medical team who will admit and clear. Appreciate their help.  Lisette Abu, PA-C Orthopedic Surgery [phone] 03/18/2019, 12:49 PM

## 2019-03-18 NOTE — Consult Note (Addendum)
Cardiology Consultation:   Patient ID: [name] MRN: [id number]; DOB: 1945/07/15  Admit date: 03/18/2019 Date of Consult: 03/18/2019  Primary Care Provider: Jeanie Sewer, DO Primary Cardiologist:Dr. Harl Bowie   Patient Profile:   [name] is a 73 y.o. male with a hx of CAD s/p stenting to LAD, HTN, HLD, DM  and COPD who is being seen today for the evaluation of preop clearance at the request of Dr. Owens Shark.   Hx of BMS to LAD in 2003. Cath 01/2011 with LM patent, LAD with prox stent mild ISR, patent mid stent, 90% mid LAD lesion. LCX 30% prox, OM2 30-40%, RCA small non-dom with no disease. Received additional stent to LAD at that time, a septal branch was jailed. LVEF 65% by LV gram.  Cath 05/2014 at [facility] with patent coronaries. Stress test 01/2017 was low risk.  Echo 01/2014: LVEF of 55-60%  He was doing well on cardiac stand point when last seen by Dr. Harl Bowie 12/2018 virtually.   History of Present Illness:   [name] had mechanical fall this morning. He mistepped on his ramp and fall forward and immediatly had left knee pain. No LOC, CP, SOB or palpitations.  Prior hx of left knee replacement. X-ray showed angulated impacted displaced periprosthetic distal left femur fracture. Plan to take him to OR tomorrow. K 3.4. Scr 1.25. WBC 14.1. COVID pending. 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 The patient denies nausea, vomiting, fever, chest pain, palpitations, shortness of breath, orthopnea, PND, dizziness, syncope, cough, congestion, abdominal pain, hematochezia, melena, lower extremity edema. Takes his medications.   Heart Pathway Score:     Past Medical History:  Diagnosis Date  . Asthma   . Coronary atherosclerosis of native coronary artery  04/22/2001      Cardiac Catheterization  . Diverticulitis   . Encounter for long-term (current) use of insulin ([place])   . Esophageal reflux   . Gout, unspecified   . Old  myocardial infarction   . Personal history of tobacco use, presenting hazards to health   . Postsurgical percutaneous transluminal coronary angioplasty status   . Pure hypercholesterolemia   . 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 Unspecified essential hypertension     Past Surgical History:  Procedure Laterality Date  . 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 CARDIAC CATHETERIZATION  2003   70% proximal LAD (plaque rupture), 40 % mid. Left dominent  . CARDIAC CATHETERIZATION  01/26/2011   LAD: Patent proximal stent. 90% calcified eccentic stenosis in med segment, mild LCX disease  . CAROTID STENTS    . CORONARY ANGIOPLASTY WITH STENT PLACEMENT  2003   Prox LAD: 3.0 X12 BMS  . CORONARY ANGIOPLASTY WITH STENT PLACEMENT  01/26/2011   Mid LAD: 3.0 X15 mm Vision BMS. Complicated by a jailed septal branch and periprocedural MI  . HEMORRHOID SURGERY    . HERNIA REPAIR      Inpatient Medications: Scheduled Meds: . allopurinol  300 mg Oral q morning - 10a  . [START ON 03/19/2019] amLODipine  10 mg Oral q morning - 10a  . atorvastatin  80 mg Oral QHS  . carvedilol  25 mg Oral BID WC  . gabapentin  600 mg Oral QHS  . insulin aspart  0-9 Units Subcutaneous TID WC  . [START ON 03/19/2019]  isosorbide mononitrate  30 mg Oral q morning - 10a  . mometasone-formoterol  2 puff Inhalation BID  . montelukast  10 mg Oral q morning - 10a  . pantoprazole  40 mg Oral q morning - 10a  . torsemide  20 mg Oral BID  . zolpidem  10 mg Oral QHS   Continuous Infusions:  PRN Meds: acetaminophen, morphine injection, ondansetron, oxyCODONE  Allergies:    Allergies  Allergen Reactions  . Lisinopril Cough    Social History:   Social History   Socioeconomic History  . Marital status: Married    Spouse name: PATRICIA  . Number of children: Not on file  . Years of education: Not on file  .  Highest education level: Not on file  Occupational History  . Occupation: SELF EMPLOYED  Social Needs  . Financial resource strain: Not on file  . Food insecurity    Worry: Not on file    Inability: Not on file  . Transportation needs    Medical: Not on file    Non-medical: Not on file  Tobacco Use  . Smoking status: Former Smoker    Packs/day: 1.50    Years: 26.00    Pack years: 39.00    Types: Cigarettes    Start date: 07/16/1959    Quit date: 04/18/1985    Years since quitting: 33.9  . Smokeless tobacco: Former Systems developer    Types: Snuff, Sarina Ser    Quit date: 04/18/1985  Substance and Sexual Activity  . Alcohol use: Yes    Alcohol/week: 0.0 standard drinks    Comment: Occasionaly  . Drug use: No  . Sexual activity: Not on file  Lifestyle  . Physical activity    Days per week: Not on file    Minutes per session: Not on file  . Stress: Not on file  Relationships  . Social Herbalist on phone: Not on file    Gets together: Not on file    Attends religious service: Not on file    Active member of club or organization: Not on file    Attends meetings of clubs or organizations: Not on file    Relationship status: Not on file  . Intimate partner violence    Fear of current or ex partner: Not on file    Emotionally abused: Not on file    Physically abused: Not on file    Forced sexual activity: Not on file  Other Topics Concern  . Not on file  Social History Narrative  . Not on file    Family History:   Family History  Problem Relation Age of Onset  . Coronary artery disease Other        Family History     ROS:  Please see the history of present illness.  All other ROS reviewed and negative.     Physical Exam/Data:   Vitals:   03/18/19 1045 03/18/19 1046  BP: 136/77   Pulse: 69   Resp: 16   Temp: 98.6 F (37 C)   TempSrc: Oral   SpO2: 99%   Weight:  114.3 kg  Height:  5\' 10"  (1.778 m)   No intake or output data in the 24 hours ending 03/18/19 1450  Last 3 Weights 03/18/2019 12/19/2018 08/07/2017  Weight (lbs) 252 lb 240 lb 250 lb  Weight (kg) 114.306 kg 108.863 kg 113.399 kg     Body mass index is 36.16 kg/m.  General:  Well nourished, well developed,  in no acute distress HEENT: normal Lymph: no adenopathy Neck: no JVD Endocrine:  No thryomegaly Vascular: No carotid bruits; FA pulses 2+ bilaterally without bruits  Cardiac:  normal S1, S2; RRR; no murmur Lungs:  clear to auscultation bilaterally, no wheezing, rhonchi or rales  Abd: soft, nontender, no hepatomegaly  Ext: no edema Musculoskeletal:  No deformities, Left knee brace Skin: warm and dry  Neuro:  CNs 2-12 intact, no focal abnormalities noted Psych:  Normal affect   EKG:  The EKG was personally reviewed and demonstrates:  SR at rate of 72 bpm   Relevant CV Studies:  Echo 01/2017 Left ventricle: The cavity size was mildly dilated. Wall   thickness was normal. Systolic function was normal. The estimated   ejection fraction was in the range of 55% to 60%. Wall motion was   normal; there were no regional wall motion abnormalities. Doppler   parameters are consistent with abnormal left ventricular   relaxation (grade 1 diastolic dysfunction). Doppler parameters   are consistent with high ventricular filling pressure. - Mitral valve: There was mild regurgitation.  01/2017 nuclear stress  Probable normal perfusion with soft tissue attenuation (diaphragm) , cannot exclude small region of distal inferior ischemia  Nuclear stress EF: 60%. This is a low risk study.  Cardiac Catheterization: 05/30/2014 Diagnostic Findings Coronary arteries Dominance: left Left main: insignificant LAD: insignificant LCx: insignificant RCA: not injected Left Ventricle Catheterization EF: 55% Wall Motion: Anterior: Normal Apical: Normal Inferior: Normal Septal: Normal Lateral: Normal Impressions 1. Right radial 2. LAD wih mild - moderate atherosclerosis - 40% in-stent 3. LCX  dominant with 20-30% 4. RCA not injected Recommendations 1. No significant CAD 2. LV normal  Cath 01/2011 STUDY FINDINGS: Hemodynamic findings: Left ventricular pressure is  176/17, with a left ventricular end-diastolic pressure of 19 mmHg.  Aortic pressure is 175/75 with a mean pressure of 118 mmHg.  Left ventricular angiography: This showed normal LV systolic function  and wall motion with an estimated ejection fraction of 65%. No  significant mitral regurgitation is noted.  Coronary angiography:  1. Left main coronary artery: The vessel is normal in size with minor  irregularities but no evidence of obstructive disease.  2. Left circumflex artery: The vessel is very large in size and  dominant. It is mildly calcified in the proximal segment with a  30% proximal stenosis. The rest of the circumflex has minor  irregularities. OM-1 is a very small-sized branch. OM-2 is normal  in size with 30-40% ostial stenosis. OM-3 is normal in size with  mild diffuse 20% disease. The posterior AV groove is a large size  branch that gives PDA and posterolateral branches. All have mild  diffuse atherosclerosis but no evidence of obstructive disease.  3. Left anterior descending artery: The vessel is large in size and  wraps around the apex distally. A stent is noted proximally with  minimal in-stent restenosis. It appears that there is another  stent in the mid segment which is also patent with minimal  restenosis. In the mid LAD between the 2 segments, there seems to  be a severe 90% eccentric stenosis. The rest of the LAD has mild  atherosclerosis but no evidence of obstructive disease. Thediagonal branches are medium in size and free of significant  disease.  4. Right coronary artery: The vessel is small in size and  nondominant. It has mild diffuse atherosclerosis but no  obstructive disease.  STUDY CONCLUSIONS:  1. Severe single-vessel coronary artery disease  with a  90% eccentric  stenosis in the mid left anterior descending artery.  2. Normal LV systolic function.  3. Recommend urgent angioplasty and stent placement to the left  anterior descending artery.   Laboratory Data:  Chemistry Recent Labs  Lab 03/18/19 1200  NA 139  K 3.4*  CL 100  CO2 27  GLUCOSE 326*  BUN 12  CREATININE 1.25*  CALCIUM 9.0  GFRNONAA 58*  GFRAA >60  ANIONGAP 12    Recent Labs  Lab 03/18/19 1200  PROT 7.3  ALBUMIN 3.8  AST 25  ALT 19  ALKPHOS 78  BILITOT 1.0   Hematology Recent Labs  Lab 03/18/19 1200  WBC 14.1*  RBC 4.13*  HGB 13.6  HCT 38.6*  MCV 93.5  MCH 32.9  MCHC 35.2  RDW 13.2  PLT 181   Radiology/Studies:  Dg Chest Portable 1 View  Result Date: 03/18/2019 CLINICAL DATA:  Fall EXAM: PORTABLE CHEST 1 VIEW COMPARISON:  None FINDINGS: The heart size and mediastinal contours are within normal limits. Both lungs are clear. No pleural effusion. The visualized skeletal structures are unremarkable. IMPRESSION: No acute process in the chest. Electronically Signed   By: Macy Mis M.D.   On: 03/18/2019 13:05   Dg Knee Complete 4 Views Left  Result Date: 03/18/2019 CLINICAL DATA:  Fall.  Knee replacement 3 years ago EXAM: LEFT KNEE - COMPLETE 4+ VIEW COMPARISON:  None. FINDINGS: Left knee replacement. Fracture of the distal femur above the prosthesis with mild posterior displacement of the fracture. No fracture of the tibia or fibula. IMPRESSION: Knee replacement in satisfactory position. Displaced fracture distal femur above the prosthesis. Electronically Signed   By: Franchot Gallo M.D.   On: 03/18/2019 11:32   Dg Femur Min 2 Views Left  Result Date: 03/18/2019 CLINICAL DATA:  Fall this morning EXAM: LEFT FEMUR 2 VIEWS COMPARISON:  None. FINDINGS: Partially visualized left total knee arthroplasty. Comminuted periprosthetic distal left femur fracture with mild impaction, apex anterior angulation and 8 mm posterior/lateral  displacement of the dominant distal fracture fragment. No left hip dislocation. No suspicious focal osseous lesions. IMPRESSION: Partially visualized left total knee arthroplasty. Comminuted angulated impacted displaced periprosthetic distal left femur fracture as detailed Electronically Signed   By: Ilona Sorrel M.D.   On: 03/18/2019 11:33    Assessment and Plan:   1. CAD - No angina. Last stress test in 2018 was low risk.  - Continue ASA, statin and BB.  2. HLD - No results found for requested labs within last 8760 hours.  - Continue Lipitor 80mg  qd  3. HTN - BP stable. Continue home medications.  4. Pre op clearance - He is easily getting > 4 mets of activity. EKG without acute abnormality. No anginal symptoms. Given past medical history and time since last visit, based on ACC/AHA guidelines, ALEXIE KOHLBECK would be at acceptable risk for the planned procedure without further cardiovascular testing.   5. Hypokalemia - Give Kdur 40mg  x1  6. AKI - Consider hydration.    [facility] will sign off.   Medication Recommendations:  Continue home medications Other recommendations (labs, testing, etc): None Follow up as an outpatient:  As scheduled with Dr. Harl Bowie  For questions or updates, please contact [facility] Please consult [email or web address] for contact info under     Jarrett Soho, PA  03/18/2019 2:50 PM

## 2019-03-18 NOTE — ED Triage Notes (Signed)
Pt reports fall this morning. Has pain in his left knee. Hx of knee replacement on affected side.

## 2019-03-18 NOTE — ED Notes (Signed)
Ortho tech called, stated they would come place knee immobilizer.

## 2019-03-18 NOTE — Discharge Summary (Addendum)
[facility] Discharge Summary  Patient name: [name] Medical record number: [id number] Date of birth: 27-May-1945 Age: 73 y.o. Gender: male Date of Admission: 03/18/2019  Date of Discharge: 03/23/2019 Admitting Physician: Martyn Malay, MD  Primary Care Provider: Jeanie Sewer, DO Consultants: Orthopedics, cardiology  Indication for Hospitalization: Periprosthetic femur fracture  Discharge Diagnoses/Problem List:  Periprosthetic femur fracture AKI HFpEF  Hyperkalemia  CAD with stents  History of MI  Type 2 diabetes Hypertension GERD Gout Hypercholesterolemia Peripheral neuropathy Allergic rhinitis Insomnia  Disposition: home  Discharge Condition: stable  Discharge Exam:  General: Alert, pleasant, cooperative and appears to be in no acute distress Cardio: Normal S1 and S2, RRR. No murmurs or rubs.   Pulm: Clear to auscultation bilaterally, no crackles, wheezing, or diminished breath sounds. Normal respiratory effort Abdomen: Bowel sounds normal. Abdomen soft and non-tender.  Extremities: Left leg in boot. Non tender on palpation of LE. No peripheral edema. Warm/ well perfused.  Strong radial pulse. Neuro: Cranial nerves grossly intact  Taken from Dr. Serita Grit note on 12/5  [facility] Course:  Patient presented to the emergency room after mechanical fall with impact to the left knee.  He has a history of a left Total knee replacement.  After presentation to the ED an x-ray indicated an angulated, impacted, displaced, peri-prosthetic distal left femur fracture.    Periprosthetic femur fracture Orthopedic surgery was consulted and they scheduled patient ORIF of left femur on 12/1 which was successful no complications. Per orthopedics patient was toetouch weight-bearing for 8 weeks. PT/OT recommended SNF placement but patient refused to go to snf stating that he had 24 hour care at home. Patient's home aspirin was restarted on  discharge. He is to follow up with orthopedics in 10-14 days for suture removal and wound check. Patient discharged with hinged brace to be worn while mobilizing.  Type II diabetes Patient takes acarbose, victoza, metformin at home. These were all initially held and patient placed on lantus and sliding scale. 48 hours post-op his home metformin, acarbose, and victoza were restarted. Patient had persistently high blood sugars in the high 200s and low 300s. The decision was made to start him on Lantus 10 units prior to discharge.  Stressed by multiple providers the importance of tight blood sugar control postoperatively.  Hemoglobin A1c 8.8 on 11/30. Patient voiced understanding and will follow up with PCP in 2 weeks.  Elevated creatinine Baseline creatinine 1.0.  Admission 1.25 with highest being 1.33 on 12/1.  Improved to 1.2 on discharge.  Felt to be less likely due to acute kidney injury and likely patient's new baseline.   Thrombocytopenia Platelets down into the 120s and 130s at times during admission.  Appears to be his baseline given platelets 128 on 01/27/2011 which was last checked prior to hospitalization.  Unclear etiology but given has not changed in over 8 years unlikely to be true pathology and likely incidental finding.  Coronary artery disease with stents  history of MI Patient's home aspirin 81 mg held prior to ORIF left leg.  Discharged on aspirin 81 mg.  Prior to his operation did consult cardiology to assist with medical clearance.  They recommended continuing his aspirin, Lipitor.  Patient was discharged on aspirin 81 mg, Lipitor 80 mg daily.  Issues for Follow Up:  1. Thrombocytopenia in hospital, repeat platelet count as outpatient 2. Consider BMP to monitor creatinine 3. Ensure patient keeps follow-up ointment with orthopedics for suture removal and wound check in  10 to 14 days.  Significant Procedures:  ORIF left leg 03/18/2019  Significant Labs and Imaging:  Recent  Labs  Lab 03/21/19 0259 03/22/19 0312 03/23/19 0407  WBC 10.2 9.0 8.6  HGB 8.9* 8.6* 9.2*  HCT 25.2* 25.1* 26.6*  PLT 120* 144* 166   Recent Labs  Lab 03/18/19 1200 03/19/19 0557 03/19/19 1305 03/20/19 0535 03/21/19 0259 03/22/19 0312 03/23/19 0407  NA 139 137  --  139 136 136 138  K 3.4* 3.5  --  4.3 3.8 3.4* 3.6  CL 100 102  --  102 101 98 98  CO2 27 26  --  28 26 28 29   GLUCOSE 326* 394*  --  316* 258* 320* 245*  BUN 12 12  --  19 17 20  24*  CREATININE 1.25* 1.21 1.33* 1.30* 1.09 1.11 1.18  CALCIUM 9.0 8.3*  --  8.3* 8.0* 8.4* 8.8*  ALKPHOS 78 62  --  60  --  65 70  AST 25 19  --  19  --  16 17  ALT 19 16  --  14  --  13 14  ALBUMIN 3.8 3.1*  --  3.0*  --  2.9* 3.0*     Results/Tests Pending at Time of Discharge:  Discharge Medications:  Allergies as of 03/23/2019      Reactions   Lisinopril Cough      Medication List    STOP taking these medications   lisinopril 40 MG tablet Commonly known as: ZESTRIL     TAKE these medications   acarbose 100 MG tablet Commonly known as: PRECOSE Take 100 mg by mouth 3 (three) times daily.   allopurinol 300 MG tablet Commonly known as: ZYLOPRIM Take 300 mg by mouth every morning.   Ambien 10 MG tablet Generic drug: zolpidem Take 10 mg by mouth at bedtime.   amLODipine 10 MG tablet Commonly known as: NORVASC Take 1 tablet (10 mg total) by mouth daily. What changed: when to take this   ascorbic acid 1000 MG tablet Commonly known as: VITAMIN C Take 1 tablet (1,000 mg total) by mouth daily.   aspirin EC 81 MG tablet Take 81 mg by mouth at bedtime.   atorvastatin 80 MG tablet Commonly known as: LIPITOR TAKE 1 TABLET (80 MG TOTAL) BY MOUTH DAILY. (STOP SIMVASTATIN) What changed: See the new instructions.   azithromycin 250 MG tablet Commonly known as: ZITHROMAX Take 250 mg by mouth every Monday, Wednesday, and Friday.   benzonatate 200 MG capsule Commonly known as: TESSALON Take 200 mg by mouth 3 (three)  times daily as needed for cough.   budesonide-formoterol 160-4.5 MCG/ACT inhaler Commonly known as: SYMBICORT Inhale 2 puffs into the lungs 2 (two) times daily.   carvedilol 25 MG tablet Commonly known as: COREG Take 25 mg by mouth 2 (two) times daily with a meal.   CLEAR EYES OP Place 1 drop into both eyes every morning.   enoxaparin 40 MG/0.4ML injection Commonly known as: LOVENOX Inject 0.4 mLs (40 mg total) into the skin daily for 28 days.   gabapentin 300 MG capsule Commonly known as: NEURONTIN Take 600 mg by mouth at bedtime.   insulin glargine 100 UNIT/ML injection Commonly known as: LANTUS Take 10 units daily at night   ipratropium 0.03 % nasal spray Commonly known as: ATROVENT Place 2 sprays into both nostrils See admin instructions. Use 2 sprays in each nostril every morning, may also use 2 sprays in each nostril at night as  needed for congestion   isosorbide mononitrate 30 MG 24 hr tablet Commonly known as: IMDUR TAKE 1 TABLET (30 MG TOTAL) BY MOUTH DAILY What changed: when to take this   levocetirizine 5 MG tablet Commonly known as: XYZAL Take 5 mg by mouth at bedtime.   metFORMIN 500 MG 24 hr tablet Commonly known as: GLUCOPHAGE-XR Take 500 mg by mouth 2 (two) times daily.   montelukast 10 MG tablet Commonly known as: SINGULAIR Take 10 mg by mouth every morning.   multivitamin with minerals Tabs tablet Take 1 tablet by mouth every morning.   nitroGLYCERIN 0.4 MG SL tablet Commonly known as: Nitrostat Place 1 tablet (0.4 mg total) under the tongue every 5 (five) minutes as needed for chest pain.   ondansetron 4 MG tablet Commonly known as: ZOFRAN Take 4 mg by mouth every 6 (six) hours as needed for nausea or vomiting.   oxyCODONE-acetaminophen 5-325 MG tablet Commonly known as: Percocet Take 1-2 tablets by mouth every 6 (six) hours as needed for moderate pain or severe pain.   pantoprazole 40 MG tablet Commonly known as: PROTONIX Take 40 mg  by mouth every morning.   PRESCRIPTION MEDICATION Inhale into the lungs at bedtime. CPAP   PRESCRIPTION MEDICATION Apply 1 application topically daily as needed (rash/itching on legs). Prescription cream   torsemide 20 MG tablet Commonly known as: DEMADEX Take 2 tablets (40 mg total) by mouth daily. What changed:   how much to take  when to take this   Victoza 18 MG/3ML Sopn Generic drug: liraglutide Inject 1.8 mg into the skin every morning.   Vitamin D3 25 MCG tablet Commonly known as: Vitamin D Take 2 tablets (2,000 Units total) by mouth daily.       Discharge Instructions: Please refer to Patient Instructions section of EMR for full details.  Patient was counseled important signs and symptoms that should prompt return to medical care, changes in medications, dietary instructions, activity restrictions, and follow up appointments.   Follow-Up Appointments: Follow-up Information    Altamese Mann[place], MD. Schedule an appointment as soon as possible for a visit in 2 week(s).   Specialty: Orthopedic Surgery Contact information: [place] [address] [phone]        Reesa Chew A, DO .   Specialty: Pain Medicine Contact information: [place] [address] [place], [place] Follow up.   Contact information: [place] [address] [phone]           Guadalupe Dawn, MD 03/24/2019, 8:36 PM PGY-3, [place]

## 2019-03-18 NOTE — H&P (Addendum)
[facility] Admission History and Physical Service Pager: [phone]  Patient name: [name] Medical record number: [id number] Date of birth: 06-Sep-1945 Age: 73 y.o. Gender: male  Primary Care Provider: No primary care provider on file. Consultants: Orthopedic surgery, cardiology Code Status: Full  Preferred Emergency Contact: Magnus Sinning, (friend), [phone]; Smitty Pluck   Chief Complaint: Fall   Assessment and Plan: [name] is a 73 y.o. male presenting with left knee pain after fall. PMH is significant for type 2 diabetes, COPD, HTN, hypercholesterolemia, CAD with stents, history of MI, GERD, gout, allergic rhinitis  Periprosthetic femur fracture Patient had mechanical fall at home with impact to the left knee.  Patient called family friend for help getting up.  Patient takes low-dose aspirin daily.  No blood thinners.  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  Does not require ambulatory device.  In ED, given Dilaudid 0.5 mg for pain control.  X-ray indicated an angulated, impacted, displaced, very prosthetic distal left femur fracture.  Orthopedic surgery was consulted and plans to have surgery tomorrow. Patient provided with knee immobilizer.  - Admit to cardiac telemetry, attending Dr. Owens Shark, inpatient - Orthopedic sugery following, appreciate recommendations  - Cardiology consulted for cardiac optimization for surgery  - npo at midnight - PT/OT: eval and treat after surgery and have weight bearing status - Vitals per routine  - Tylenol 650mg  Q6H PRN - Oxycodone IR 5 mg Q6H PRN for severe pain - Morphine 2 mg Q6H PRN breakthrough pain - Zofran every 6 hours as needed - AM CBC / BMP - f/u vitamin d   AKI Creatinine on admission 1.25 with baseline ~0.9-1.0. Unclear if it represents a new baseline for him or represents actual kidney injury -Avoid nephrotoxic agents -AM  BMP  Hypokalemia Admission potassium 3.4.  Repleted with 40 mg K-Dur. -Replete as needed -AM BMP  CAD w/ stents  Hx of MI  Seen by his cardiologist in 12/2018.  History of 3 coronary artery stents. Denies chest pain or shortness of breath.  Home medications include 81 mg aspirin daily, Lipitor 80 mg daily.  Per cardiology, patient had acceptable risk for planned procedure without further cardiovascular testing required.  Nuclear stress test test in 01/2017 was low risk with estimated EF 55 to 60%. -Cardiology consulted, appreciate recommendations  T2DM  Glucose on admission 326.  Home medications include Victoza 1.8 mg in the morning, Metformin 500 mg twice daily and acarbose 100 mg 3 times daily.   -Hold home medication -Monitor CBGs -Lantus 8 units, titrate as needed. -sensitive Sliding scale insulin  COPD  Prior CPAP at night.  Follows with Dr. Arlis Porta. Home medications Symbicort and Albuterol inhaler.  Denies increasing sputum production and worsening cough.  Has not needed rescue inhaler recently. Ruthe Mannan daily -Incentive spirometry   HTN  BP on admission 136/77. Home medications include Coreg 25 mg twice daily, Imdur 30 mg daily, amlodipine 10 mg and  torsemide 40 mg daily.  Not taking lisinopril as developed a cough. -Continue home Coreg, Imdur, torsemide  GERD  Home medications include Protonix 40 mg daily -Continue home medication  Gout  Home medications include allopurinol 300 mg in the morning. -Continue home medications  Hypercholesterolemia  No recent lipid panel available for review.  Medications include 80 mg atorvastatin -Continue home medication -Consider a.m. lipid panel  Peripheral neuropathy Home medications include gabapentin 600 mg at bedtime. - Continue home medication  Allergic rhinitis  Home medications include Atrovent, Xyzal 5 mg at bedtime, Singulair 10 mg daily, -Continue Singulair  Insomnia Home medications include 10 mg Ambien at  bedtime. -Continue home medication  FEN/GI: heart healthy carb modified diet, NPO. at midnight Prophylaxis: Heparin  Disposition: Admit to cardiac telemetry  History of Present Illness:  [name] is a 73 y.o. male presenting after fall at his home this morning.  Patient reports missing a step on the porch and fell to his knee.  Denies loss of consciousness, chest pain, shortness of breath.  Stated that he called [facility] Poli to help get him up.  Does not use a walker or cane at home. Endorses left leg pain and ongoing chronic left lower quadrant abdominal pain.  Denies nausea, vomiting, dysuria and changes in bowel and bladder habits.  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   In the ED, left femur x-ray showed displaced periprosthetic distal left femur fracture.  Ortho was consulted and saw patient in the ED and plans to take patient to the OR tomorrow.  Review Of Systems: Per HPI with the following additions:   Review of Systems  HENT: Negative for sore throat.   Respiratory: Negative for shortness of breath.   Cardiovascular: Negative for chest pain.  Gastrointestinal: Positive for abdominal pain. Negative for nausea and vomiting.  Genitourinary: Negative for dysuria.  Musculoskeletal: Positive for falls.  Neurological: Positive for focal weakness. Negative for loss of consciousness.    Patient Active Problem List   Diagnosis Date Noted  . Femur fracture ([place]) 03/18/2019  . Pure hypercholesterolemia   . Essential hypertension   . Coronary atherosclerosis of native coronary artery 04/22/2001    Past Medical History: Past Medical History:  Diagnosis Date  . Asthma   . Coronary atherosclerosis of native coronary artery  04/22/2001      Cardiac Catheterization  . Diverticulitis   . Encounter for long-term (current) use of insulin ([place])   . Esophageal reflux   . Gout, unspecified   . Old myocardial infarction   . Personal history of tobacco use, presenting hazards  to health   . Postsurgical percutaneous transluminal coronary angioplasty status   . Pure hypercholesterolemia   . 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 Unspecified essential hypertension     Past Surgical History: Past Surgical History:  Procedure Laterality Date  . 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 CARDIAC CATHETERIZATION  2003   70% proximal LAD (plaque rupture), 40 % mid. Left dominent  . CARDIAC CATHETERIZATION  01/26/2011   LAD: Patent proximal stent. 90% calcified eccentic stenosis in med segment, mild LCX disease  . CAROTID STENTS    . CORONARY ANGIOPLASTY WITH STENT PLACEMENT  2003   Prox LAD: 3.0 X12 BMS  . CORONARY ANGIOPLASTY WITH STENT PLACEMENT  01/26/2011   Mid LAD: 3.0 X15 mm Vision BMS. Complicated by a jailed septal branch and periprocedural MI  . HEMORRHOID SURGERY    . HERNIA REPAIR      Social History: Social History   Tobacco Use  . Smoking status: Former Smoker    Packs/day: 1.50    Years: 26.00    Pack years: 39.00    Types: Cigarettes    Start date: 07/16/1959    Quit date: 04/18/1985    Years since quitting: 33.9  . Smokeless tobacco: Former Systems developer  Types: Snuff, Chew    Quit date: 04/18/1985  Substance Use Topics  . Alcohol use: Yes    Alcohol/week: 0.0 standard drinks    Comment: Occasionaly  . Drug use: No   Additional social history:  Please also refer to relevant sections of EMR.  Family History: Family History  Problem Relation Age of Onset  . Coronary artery disease Other        Family History     Allergies and Medications: Allergies  Allergen Reactions  . Lisinopril Cough   No current facility-administered medications on file prior to encounter.    Current Outpatient Medications on File Prior to Encounter  Medication Sig Dispense Refill  . aspirin EC 81 MG tablet Take 81 mg by mouth at bedtime.    . Multiple  Vitamin (MULTIVITAMIN WITH MINERALS) TABS tablet Take 1 tablet by mouth every morning.    Marland Kitchen allopurinol (ZYLOPRIM) 300 MG tablet Take 300 mg by mouth daily.    Marland Kitchen amLODipine (NORVASC) 10 MG tablet Take 1 tablet (10 mg total) by mouth daily. 30 tablet 6  . atorvastatin (LIPITOR) 80 MG tablet TAKE 1 TABLET (80 MG TOTAL) BY MOUTH DAILY. (STOP SIMVASTATIN) 30 tablet 6  . budesonide-formoterol (SYMBICORT) 160-4.5 MCG/ACT inhaler Inhale 2 puffs into the lungs 2 (two) times daily.    . carvedilol (COREG) 25 MG tablet Take 25 mg by mouth 2 (two) times daily with a meal.      . diclofenac (VOLTAREN) 75 MG EC tablet Take 75 mg by mouth daily.    . fexofenadine (ALLEGRA) 180 MG tablet Take 180 mg by mouth daily.      Marland Kitchen gabapentin (NEURONTIN) 300 MG capsule Take 300 mg by mouth daily.    Marland Kitchen glimepiride (AMARYL) 4 MG tablet Take 4 mg by mouth daily with breakfast.    . ipratropium (ATROVENT HFA) 17 MCG/ACT inhaler Inhale 2 puffs into the lungs as needed for wheezing.    . isosorbide mononitrate (IMDUR) 30 MG 24 hr tablet TAKE 1 TABLET (30 MG TOTAL) BY MOUTH DAILY 30 tablet 0  . Ketoprofen 10 % CREA by Does not apply route.    . Liraglutide (VICTOZA) 18 MG/3ML SOPN Inject into the skin daily.    Marland Kitchen lisinopril (PRINIVIL,ZESTRIL) 40 MG tablet Take 1 tablet (40 mg total) by mouth daily. 90 tablet 3  . metFORMIN (GLUCOPHAGE) 500 MG tablet Take 500 mg by mouth 2 (two) times daily.    . montelukast (SINGULAIR) 10 MG tablet Take 10 mg by mouth at bedtime.    . nitroGLYCERIN (NITROSTAT) 0.4 MG SL tablet Place 1 tablet (0.4 mg total) under the tongue every 5 (five) minutes as needed for chest pain. 25 tablet 3  . OXYGEN Inhale 2 L/min into the lungs at bedtime.    . pantoprazole (PROTONIX) 40 MG tablet Take 40 mg by mouth daily.      . repaglinide (PRANDIN) 1 MG tablet Take 1 mg by mouth 3 (three) times daily before meals.    . sodium chloride (OCEAN) 0.65 % nasal spray Place 1 spray into the nose as needed for  congestion.    . torsemide (DEMADEX) 20 MG tablet Take 2 tablets (40 mg total) by mouth daily. 180 tablet 3  . zolpidem (AMBIEN) 10 MG tablet Take 10 mg by mouth at bedtime as needed for sleep. Take 0.25 tablet at bedtime      Objective: BP 136/77 (BP Location: Right Arm)   Pulse 69   Temp 98.6 F (  37 C) (Oral)   Resp 16   Ht 5\' 10"  (1.778 m)   Wt 114.3 kg   SpO2 99%   BMI 36.16 kg/m  Exam:  GEN:     alert, pleasant elderly male, comfortable appearing and in no acute distress    HENT:  mucus membranes moist, nares patent, atraumatic EYES:   pupils equal and reactive, EOM intact NECK:  supple, normal ROM RESP:  clear to auscultation bilaterally, no increased work of breathing CVS:   regular rate and rhythm, no murmur, distal pulses intact ABD:  soft, LLQ tenderness; bowel sounds present; no palpable masses EXT:   left knee: Edematous, limited range of motion due to pain, surgical wound linear scar, tender to palpation NEURO:  normal without focal findings,  speech normal, alert and oriented   Skin:   warm and dry, normal skin turgor Psych: Normal affect and thought content    Labs and Imaging: CBC BMET  Recent Labs  Lab 03/18/19 1200  WBC 14.1*  HGB 13.6  HCT 38.6*  PLT 181   Recent Labs  Lab 03/18/19 1200  NA 139  K 3.4*  CL 100  CO2 27  BUN 12  CREATININE 1.25*  GLUCOSE 326*  CALCIUM 9.0     Dg Chest Portable 1 View  Result Date: 03/18/2019 CLINICAL DATA:  Fall EXAM: PORTABLE CHEST 1 VIEW COMPARISON:  None FINDINGS: The heart size and mediastinal contours are within normal limits. Both lungs are clear. No pleural effusion. The visualized skeletal structures are unremarkable. IMPRESSION: No acute process in the chest. Electronically Signed   By: Macy Mis M.D.   On: 03/18/2019 13:05   Dg Knee Complete 4 Views Left  Result Date: 03/18/2019 CLINICAL DATA:  Fall.  Knee replacement 3 years ago EXAM: LEFT KNEE - COMPLETE 4+ VIEW COMPARISON:  None.  FINDINGS: Left knee replacement. Fracture of the distal femur above the prosthesis with mild posterior displacement of the fracture. No fracture of the tibia or fibula. IMPRESSION: Knee replacement in satisfactory position. Displaced fracture distal femur above the prosthesis. Electronically Signed   By: Franchot Gallo M.D.   On: 03/18/2019 11:32   Dg Femur Min 2 Views Left  Result Date: 03/18/2019 CLINICAL DATA:  Fall this morning EXAM: LEFT FEMUR 2 VIEWS COMPARISON:  None. FINDINGS: Partially visualized left total knee arthroplasty. Comminuted periprosthetic distal left femur fracture with mild impaction, apex anterior angulation and 8 mm posterior/lateral displacement of the dominant distal fracture fragment. No left hip dislocation. No suspicious focal osseous lesions. IMPRESSION: Partially visualized left total knee arthroplasty. Comminuted angulated impacted displaced periprosthetic distal left femur fracture as detailed Electronically Signed   By: Ilona Sorrel M.D.   On: 03/18/2019 11:33     Lyndee Hensen, MD 03/18/2019, 1:44 PM PGY-1, [place] Intern pager: [phone], text pages welcome ----------------------------------------------------------------------------------------------- Upper Level Addendum: I have seen and evaluated this patient along with Dr. Susa Simmonds and reviewed the above note, making necessary revisions in blue.  Guadalupe Dawn MD PGY-3 Family Medicine Resident

## 2019-03-18 NOTE — Progress Notes (Signed)
Orthopedic Tech Progress Note Patient Details:  [name] 1946-01-13 [id number]  Ortho Devices Type of Ortho Device: Knee Immobilizer Ortho Device/Splint Location: LLE Ortho Device/Splint Interventions: Ordered, Application   Post Interventions Patient Tolerated: Well   Braulio Bosch 03/18/2019, 2:24 PM

## 2019-03-18 NOTE — ED Provider Notes (Signed)
[facility] EMERGENCY DEPARTMENT Provider Note   CSN: [id number] Arrival date & time: 03/18/19  1043     History   Chief Complaint Chief Complaint  Patient presents with  . Knee Pain    HPI TAAJ JONCAS is a 73 y.o. male.     HPI Patient presents after a fall.  States he was walking on incline fell forward.  Pain in his left knee.  Has previous left knee replacement done at [facility].  Does not have an orthopedic surgeon in [place], however his wife sees Dr. Alvan Dame.  Complaining of mild lower abdominal pain also.  Patient states he has had 3 heart stents.. Did not hit his head.  Not on anticoagulation. Past Medical History:  Diagnosis Date  . Asthma   . Coronary atherosclerosis of native coronary artery  04/22/2001      Cardiac Catheterization  . Diverticulitis   . Encounter for long-term (current) use of insulin ([place])   . Esophageal reflux   . Gout, unspecified   . Old myocardial infarction   . Personal history of tobacco use, presenting hazards to health   . Postsurgical percutaneous transluminal coronary angioplasty status   . Pure hypercholesterolemia   . 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 Unspecified essential hypertension     Patient Active Problem List   Diagnosis Date Noted  . Pure hypercholesterolemia   . Essential hypertension   . Coronary atherosclerosis of native coronary artery 04/22/2001    Past Surgical History:  Procedure Laterality Date  . 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 CARDIAC CATHETERIZATION  2003   70% proximal LAD (plaque rupture), 40 % mid. Left dominent  . CARDIAC CATHETERIZATION  01/26/2011   LAD: Patent proximal stent. 90% calcified eccentic stenosis in med segment, mild LCX disease  . CAROTID STENTS    . CORONARY ANGIOPLASTY WITH STENT PLACEMENT  2003   Prox LAD: 3.0 X12 BMS  . CORONARY ANGIOPLASTY  WITH STENT PLACEMENT  01/26/2011   Mid LAD: 3.0 X15 mm Vision BMS. Complicated by a jailed septal branch and periprocedural MI  . HEMORRHOID SURGERY    . HERNIA REPAIR          Home Medications    Prior to Admission medications   Medication Sig Start Date End Date Taking? Authorizing Provider  allopurinol (ZYLOPRIM) 300 MG tablet Take 300 mg by mouth daily.    [provider]  ALPRAZolam Duanne Moron) 0.5 MG tablet Take 0.5 mg by mouth at bedtime as needed for anxiety.    [provider]  amLODipine (NORVASC) 10 MG tablet Take 1 tablet (10 mg total) by mouth daily. 12/10/13   Arnoldo Lenis, MD  aspirin 81 MG tablet Take 81 mg by mouth daily.      [provider]  atorvastatin (LIPITOR) 80 MG tablet TAKE 1 TABLET (80 MG TOTAL) BY MOUTH DAILY. (STOP SIMVASTATIN) 08/08/14   Arnoldo Lenis, MD  budesonide-formoterol [facility]) 160-4.5 MCG/ACT inhaler Inhale 2 puffs into the lungs 2 (two) times daily.    [provider]  carvedilol (COREG) 25 MG tablet Take 25 mg by mouth 2 (two) times daily with a meal.      [provider]  diclofenac (VOLTAREN) 75 MG EC tablet Take 75 mg by mouth daily.    [provider]  fexofenadine (ALLEGRA) 180 MG  tablet Take 180 mg by mouth daily.      [provider]  gabapentin (NEURONTIN) 300 MG capsule Take 300 mg by mouth daily.    [provider]  glimepiride (AMARYL) 4 MG tablet Take 4 mg by mouth daily with breakfast.    [provider]  ipratropium (ATROVENT HFA) 17 MCG/ACT inhaler Inhale 2 puffs into the lungs as needed for wheezing.    [provider]  isosorbide mononitrate (IMDUR) 30 MG 24 hr tablet TAKE 1 TABLET (30 MG TOTAL) BY MOUTH DAILY 02/22/18 12/19/19  Herminio Commons, MD  Ketoprofen 10 % CREA by Does not apply route.    [provider]  Liraglutide (VICTOZA) 18 MG/3ML SOPN Inject into the skin daily.    [provider]  lisinopril  (PRINIVIL,ZESTRIL) 40 MG tablet Take 1 tablet (40 mg total) by mouth daily. 01/23/15   Arnoldo Lenis, MD  metFORMIN (GLUCOPHAGE) 500 MG tablet Take 500 mg by mouth 2 (two) times daily.    [provider]  montelukast (SINGULAIR) 10 MG tablet Take 10 mg by mouth at bedtime.    [provider]  Multiple Vitamins-Minerals (CENTRUM PO) Take by mouth daily.      [provider]  nitroGLYCERIN (NITROSTAT) 0.4 MG SL tablet Place 1 tablet (0.4 mg total) under the tongue every 5 (five) minutes as needed for chest pain. 01/20/11   Wellington Hampshire, MD  OXYGEN Inhale 2 L/min into the lungs at bedtime.    [provider]  pantoprazole (PROTONIX) 40 MG tablet Take 40 mg by mouth daily.      [provider]  repaglinide (PRANDIN) 1 MG tablet Take 1 mg by mouth 3 (three) times daily before meals.    [provider]  sodium chloride (OCEAN) 0.65 % nasal spray Place 1 spray into the nose as needed for congestion.    [provider]  torsemide (DEMADEX) 20 MG tablet Take 2 tablets (40 mg total) by mouth daily. 12/19/18 03/19/19  Arnoldo Lenis, MD  zolpidem (AMBIEN) 10 MG tablet Take 10 mg by mouth at bedtime as needed for sleep. Take 0.25 tablet at bedtime    [provider]    Family History Family History  Problem Relation Age of Onset  . Coronary artery disease Other        Family History    Social History Social History   Tobacco Use  . Smoking status: Former Smoker    Packs/day: 1.50    Years: 26.00    Pack years: 39.00    Types: Cigarettes    Start date: 07/16/1959    Quit date: 04/18/1985    Years since quitting: 33.9  . Smokeless tobacco: Former User    Types: Snuff, Sarina Ser    Quit date: 04/18/1985  Substance Use Topics  . Alcohol use: Yes    Alcohol/week: 0.0 standard drinks    Comment: Occasionaly  . Drug use: No     Allergies   Patient has no known allergies.   Review of Systems Review of Systems   Constitutional: Negative for appetite change.  HENT: Negative for congestion.   Respiratory: Negative for shortness of breath.   Gastrointestinal: Negative for abdominal pain.  Musculoskeletal: Positive for gait problem.       Left knee pain.  Pelvic pain.  Skin: Negative for wound.  Neurological: Negative for weakness.     Physical Exam Updated Vital Signs BP 136/77 (BP Location: Right Arm)  Pulse 69   Temp 98.6 F (37 C) (Oral)   Resp 16   Ht 5\' 10"  (1.778 m)   Wt 114.3 kg   SpO2 99%   BMI 36.16 kg/m   Physical Exam Vitals signs and nursing note reviewed.  HENT:     Head: Normocephalic and atraumatic.  Eyes:     Extraocular Movements: Extraocular movements intact.     Pupils: Pupils are equal, round, and reactive to light.  Neck:     Musculoskeletal: Neck supple.  Cardiovascular:     Rate and Rhythm: Regular rhythm.  Pulmonary:     Breath sounds: No wheezing, rhonchi or rales.  Abdominal:     Comments: Mild left lower abdominal tenderness.  No rebound or guarding.  Musculoskeletal:     Comments: Tenderness deformity to proximal left knee.  Sensation and strength intact in left foot.  No clear tenderness over pelvis.  Skin:    General: Skin is warm.     Capillary Refill: Capillary refill takes less than 2 seconds.  Neurological:     Mental Status: He is oriented to person, place, and time. Mental status is at baseline.      ED Treatments / Results  Labs (all labs ordered are listed, but only abnormal results are displayed) Labs Reviewed  CBC WITH DIFFERENTIAL/PLATELET - Abnormal; Notable for the following components:      Result Value   WBC 14.1 (*)    RBC 4.13 (*)    HCT 38.6 (*)    Neutro Abs 11.2 (*)    Monocytes Absolute 1.2 (*)    All other components within normal limits  COMPREHENSIVE METABOLIC PANEL  SAMPLE TO BLOOD BANK    EKG None  Radiology Dg Knee Complete 4 Views Left  Result Date: 03/18/2019 CLINICAL DATA:  Fall.  Knee  replacement 3 years ago EXAM: LEFT KNEE - COMPLETE 4+ VIEW COMPARISON:  None. FINDINGS: Left knee replacement. Fracture of the distal femur above the prosthesis with mild posterior displacement of the fracture. No fracture of the tibia or fibula. IMPRESSION: Knee replacement in satisfactory position. Displaced fracture distal femur above the prosthesis. Electronically Signed   By: Franchot Gallo M.D.   On: 03/18/2019 11:32   Dg Femur Min 2 Views Left  Result Date: 03/18/2019 CLINICAL DATA:  Fall this morning EXAM: LEFT FEMUR 2 VIEWS COMPARISON:  None. FINDINGS: Partially visualized left total knee arthroplasty. Comminuted periprosthetic distal left femur fracture with mild impaction, apex anterior angulation and 8 mm posterior/lateral displacement of the dominant distal fracture fragment. No left hip dislocation. No suspicious focal osseous lesions. IMPRESSION: Partially visualized left total knee arthroplasty. Comminuted angulated impacted displaced periprosthetic distal left femur fracture as detailed Electronically Signed   By: Ilona Sorrel M.D.   On: 03/18/2019 11:33    Procedures Procedures (including critical care time)  Medications Ordered in ED Medications  HYDROmorphone (DILAUDID) injection 0.5 mg (0.5 mg Intravenous Given 03/18/19 1226)     Initial Impression / Assessment and Plan / ED Course  I have reviewed the triage vital signs and the nursing notes.  Pertinent labs & imaging results that were available during my care of the patient were reviewed by me and considered in my medical decision making (see chart for details).        *Patient with fall.  Periprosthetic knee fracture.  Seen by Hilbert Odor from orthopedic surgery.  Expect surgery tomorrow with Dr. Marcelino Scot.  No other apparent injury at this time.  Doubt intra-abdominal  injury.  Patient's PCP is in [place].  Will admit to unassigned.  Final Clinical Impressions(s) / ED Diagnoses   Final diagnoses:   Periprosthetic fracture of knee    ED Discharge Orders    None       Davonna Belling, MD 03/18/19 1253

## 2019-03-19 ENCOUNTER — Inpatient Hospital Stay (HOSPITAL_COMMUNITY): Payer: Medicare Other | Admitting: Certified Registered Nurse Anesthetist

## 2019-03-19 ENCOUNTER — Inpatient Hospital Stay (HOSPITAL_COMMUNITY): Payer: Medicare Other

## 2019-03-19 ENCOUNTER — Other Ambulatory Visit: Payer: Self-pay

## 2019-03-19 ENCOUNTER — Encounter (HOSPITAL_COMMUNITY)
Admission: EM | Disposition: A | Discharge status: Home Health | Payer: Self-pay | Source: Home / Self Care | Attending: Family Medicine

## 2019-03-19 ENCOUNTER — Encounter (HOSPITAL_COMMUNITY): Payer: Self-pay | Admitting: Anesthesiology

## 2019-03-19 HISTORY — PX: ORIF FEMUR FRACTURE: SHX2119

## 2019-03-19 LAB — GLUCOSE, CAPILLARY
Glucose-Capillary: 400 mg/dL — ABNORMAL HIGH (ref 70–99)
Glucose-Capillary: 280 mg/dL — ABNORMAL HIGH (ref 70–99)
Glucose-Capillary: 233 mg/dL — ABNORMAL HIGH (ref 70–99)
Glucose-Capillary: 369 mg/dL — ABNORMAL HIGH (ref 70–99)
Glucose-Capillary: 401 mg/dL — ABNORMAL HIGH (ref 70–99)

## 2019-03-19 LAB — COMPREHENSIVE METABOLIC PANEL
ALT: 16 U/L (ref 0–44)
AST: 19 U/L (ref 15–41)
Albumin: 3.1 g/dL — ABNORMAL LOW (ref 3.5–5.0)
Alkaline Phosphatase: 62 U/L (ref 38–126)
Anion gap: 9 (ref 5–15)
BUN: 12 mg/dL (ref 8–23)
CO2: 26 mmol/L (ref 22–32)
Calcium: 8.3 mg/dL — ABNORMAL LOW (ref 8.9–10.3)
Chloride: 102 mmol/L (ref 98–111)
Creatinine, Ser: 1.21 mg/dL (ref 0.61–1.24)
GFR calc Af Amer: >60 mL/min (ref >60)
GFR calc non Af Amer: 60 mL/min — ABNORMAL LOW (ref >60)
Glucose, Bld: 394 mg/dL — ABNORMAL HIGH (ref 70–99)
Potassium: 3.5 mmol/L (ref 3.5–5.1)
Sodium: 137 mmol/L (ref 135–145)
Total Bilirubin: 0.8 mg/dL (ref 0.3–1.2)
Total Protein: 6.3 g/dL — ABNORMAL LOW (ref 6.5–8.1)

## 2019-03-19 LAB — CBC WITH DIFFERENTIAL/PLATELET
Abs Immature Granulocytes: 0.04 10*3/uL (ref 0.00–0.07)
Basophils Absolute: 0.1 10*3/uL (ref 0.0–0.1)
Basophils Relative: 1 %
Eosinophils Absolute: 0.1 10*3/uL (ref 0.0–0.5)
Eosinophils Relative: 1 %
HCT: 34.1 % — ABNORMAL LOW (ref 39.0–52.0)
Hemoglobin: 11.8 g/dL — ABNORMAL LOW (ref 13.0–17.0)
Immature Granulocytes: 0 %
Lymphocytes Relative: 15 %
Lymphs Abs: 1.5 10*3/uL (ref 0.7–4.0)
MCH: 32.9 pg (ref 26.0–34.0)
MCHC: 34.6 g/dL (ref 30.0–36.0)
MCV: 95 fL (ref 80.0–100.0)
Monocytes Absolute: 1.5 10*3/uL — ABNORMAL HIGH (ref 0.1–1.0)
Monocytes Relative: 15 %
Neutro Abs: 6.8 10*3/uL (ref 1.7–7.7)
Neutrophils Relative %: 68 %
Platelets: 149 10*3/uL — ABNORMAL LOW (ref 150–400)
RBC: 3.59 MIL/uL — ABNORMAL LOW (ref 4.22–5.81)
RDW: 13.4 % (ref 11.5–15.5)
WBC: 9.9 10*3/uL (ref 4.0–10.5)
nRBC: 0 % (ref 0.0–0.2)

## 2019-03-19 LAB — SURGICAL PCR SCREEN
MRSA, PCR: POSITIVE — AB
Staphylococcus aureus: POSITIVE — AB

## 2019-03-19 LAB — TYPE AND SCREEN
ABO/RH(D): O POS
Antibody Screen: NEGATIVE

## 2019-03-19 LAB — VITAMIN D 25 HYDROXY (VIT D DEFICIENCY, FRACTURES): Vit D, 25-Hydroxy: 33.41 ng/mL (ref 30–100)

## 2019-03-19 LAB — CREATININE, SERUM
Creatinine, Ser: 1.33 mg/dL — ABNORMAL HIGH (ref 0.61–1.24)
GFR calc Af Amer: >60 mL/min (ref >60)
GFR calc non Af Amer: 53 mL/min — ABNORMAL LOW (ref >60)

## 2019-03-19 LAB — PROTIME-INR
INR: 1.1 (ref 0.8–1.2)
Prothrombin Time: 14.3 seconds (ref 11.4–15.2)

## 2019-03-19 LAB — ABO/RH: ABO/RH(D): O POS

## 2019-03-19 SURGERY — OPEN REDUCTION INTERNAL FIXATION (ORIF) DISTAL FEMUR FRACTURE
Anesthesia: General | Site: Knee | Laterality: Left

## 2019-03-19 MED ORDER — METOCLOPRAMIDE HCL 5 MG PO TABS: 5.0000 mg | ORAL_TABLET | Freq: Three times a day (TID) | ORAL | Status: DC | PRN

## 2019-03-19 MED ORDER — MIDAZOLAM HCL 2 MG/2ML IJ SOLN
INTRAMUSCULAR | Status: DC | PRN
  Administered 2019-03-19: 2 mg via INTRAVENOUS

## 2019-03-19 MED ORDER — ONDANSETRON HCL 4 MG PO TABS: 4.0000 mg | ORAL_TABLET | Freq: Four times a day (QID) | ORAL | Status: DC | PRN

## 2019-03-19 MED ORDER — ACETAMINOPHEN 10 MG/ML IV SOLN: 1000.0000 mg | Freq: Once | INTRAVENOUS | Status: DC | PRN

## 2019-03-19 MED ORDER — LIDOCAINE 2% (20 MG/ML) 5 ML SYRINGE
INTRAMUSCULAR | Status: AC
  Filled 2019-03-19: qty 5

## 2019-03-19 MED ORDER — CHLORHEXIDINE GLUCONATE CLOTH 2 % EX PADS
6.0000 | MEDICATED_PAD | Freq: Every day | CUTANEOUS | Status: DC
  Administered 2019-03-20 – 2019-03-23 (×4): 6 via TOPICAL

## 2019-03-19 MED ORDER — FENTANYL CITRATE (PF) 250 MCG/5ML IJ SOLN
INTRAMUSCULAR | Status: DC | PRN
  Administered 2019-03-19: 25 ug via INTRAVENOUS
  Administered 2019-03-19: 100 ug via INTRAVENOUS

## 2019-03-19 MED ORDER — INSULIN GLARGINE 100 UNIT/ML ~~LOC~~ SOLN
12.0000 [IU] | Freq: Every day | SUBCUTANEOUS | Status: DC
  Administered 2019-03-19: 12 [IU] via SUBCUTANEOUS
  Filled 2019-03-19 (×2): qty 0.12

## 2019-03-19 MED ORDER — VITAMIN D 25 MCG (1000 UNIT) PO TABS
500.0000 [IU] | ORAL_TABLET | Freq: Every day | ORAL | Status: DC
  Administered 2019-03-19: 500 [IU] via ORAL
  Filled 2019-03-19: qty 1
  Filled 2019-03-19 (×3): qty 0.5
  Filled 2019-03-19: qty 1

## 2019-03-19 MED ORDER — CEFAZOLIN SODIUM-DEXTROSE 1-4 GM/50ML-% IV SOLN
1.0000 g | Freq: Four times a day (QID) | INTRAVENOUS | Status: AC
  Administered 2019-03-19 – 2019-03-20 (×3): 1 g via INTRAVENOUS
  Filled 2019-03-19 (×3): qty 50

## 2019-03-19 MED ORDER — DEXAMETHASONE SODIUM PHOSPHATE 10 MG/ML IJ SOLN
INTRAMUSCULAR | Status: AC
  Filled 2019-03-19: qty 1

## 2019-03-19 MED ORDER — ACETAMINOPHEN 160 MG/5ML PO SOLN: 325.0000 mg | Freq: Once | ORAL | Status: DC | PRN

## 2019-03-19 MED ORDER — ACETAMINOPHEN 500 MG PO TABS
1000.0000 mg | ORAL_TABLET | Freq: Three times a day (TID) | ORAL | Status: AC
  Administered 2019-03-19 – 2019-03-20 (×4): 1000 mg via ORAL
  Filled 2019-03-19 (×4): qty 2

## 2019-03-19 MED ORDER — LACTATED RINGERS IV SOLN
INTRAVENOUS | Status: DC
  Administered 2019-03-19 (×2): via INTRAVENOUS

## 2019-03-19 MED ORDER — PROMETHAZINE HCL 25 MG/ML IJ SOLN: 6.2500 mg | INTRAMUSCULAR | Status: DC | PRN

## 2019-03-19 MED ORDER — PROPOFOL 10 MG/ML IV BOLUS
INTRAVENOUS | Status: AC
  Filled 2019-03-19: qty 40

## 2019-03-19 MED ORDER — ROCURONIUM BROMIDE 10 MG/ML (PF) SYRINGE
PREFILLED_SYRINGE | INTRAVENOUS | Status: AC
  Filled 2019-03-19: qty 10

## 2019-03-19 MED ORDER — ONDANSETRON HCL 4 MG/2ML IJ SOLN: 4.0000 mg | Freq: Four times a day (QID) | INTRAMUSCULAR | Status: DC | PRN

## 2019-03-19 MED ORDER — ENSURE PRE-SURGERY PO LIQD
296.0000 mL | Freq: Once | ORAL | Status: AC
  Administered 2019-03-19: 296 mL via ORAL
  Filled 2019-03-19: qty 296

## 2019-03-19 MED ORDER — MUPIROCIN 2 % EX OINT
1.0000 "application " | TOPICAL_OINTMENT | Freq: Two times a day (BID) | CUTANEOUS | Status: DC
  Administered 2019-03-19 – 2019-03-23 (×8): 1 via NASAL
  Filled 2019-03-19 (×3): qty 22

## 2019-03-19 MED ORDER — LACTATED RINGERS IV SOLN: INTRAVENOUS | Status: DC

## 2019-03-19 MED ORDER — MIDAZOLAM HCL 2 MG/2ML IJ SOLN
INTRAMUSCULAR | Status: AC
  Filled 2019-03-19: qty 2

## 2019-03-19 MED ORDER — HYDROMORPHONE HCL 1 MG/ML IJ SOLN: 0.2500 mg | INTRAMUSCULAR | Status: DC | PRN

## 2019-03-19 MED ORDER — ENOXAPARIN SODIUM 40 MG/0.4ML ~~LOC~~ SOLN
40.0000 mg | SUBCUTANEOUS | Status: DC
  Administered 2019-03-20 – 2019-03-23 (×4): 40 mg via SUBCUTANEOUS
  Filled 2019-03-19 (×4): qty 0.4

## 2019-03-19 MED ORDER — 0.9 % SODIUM CHLORIDE (POUR BTL) OPTIME
TOPICAL | Status: DC | PRN
  Administered 2019-03-19: 1000 mL

## 2019-03-19 MED ORDER — ONDANSETRON HCL 4 MG/2ML IJ SOLN
INTRAMUSCULAR | Status: DC | PRN
  Administered 2019-03-19: 4 mg via INTRAVENOUS

## 2019-03-19 MED ORDER — PROPOFOL 10 MG/ML IV BOLUS
INTRAVENOUS | Status: DC | PRN
  Administered 2019-03-19: 10 mg via INTRAVENOUS
  Administered 2019-03-19: 100 mg via INTRAVENOUS

## 2019-03-19 MED ORDER — DOCUSATE SODIUM 100 MG PO CAPS
100.0000 mg | ORAL_CAPSULE | Freq: Two times a day (BID) | ORAL | Status: DC
  Administered 2019-03-19 – 2019-03-23 (×9): 100 mg via ORAL
  Filled 2019-03-19 (×9): qty 1

## 2019-03-19 MED ORDER — MEPERIDINE HCL 25 MG/ML IJ SOLN: 6.2500 mg | INTRAMUSCULAR | Status: DC | PRN

## 2019-03-19 MED ORDER — ONDANSETRON HCL 4 MG/2ML IJ SOLN
INTRAMUSCULAR | Status: AC
  Filled 2019-03-19: qty 2

## 2019-03-19 MED ORDER — MORPHINE SULFATE (PF) 2 MG/ML IV SOLN: 0.5000 mg | INTRAVENOUS | Status: DC | PRN

## 2019-03-19 MED ORDER — ACETAMINOPHEN 325 MG PO TABS: 325.0000 mg | ORAL_TABLET | Freq: Once | ORAL | Status: DC | PRN

## 2019-03-19 MED ORDER — METOCLOPRAMIDE HCL 5 MG/ML IJ SOLN: 5.0000 mg | Freq: Three times a day (TID) | INTRAMUSCULAR | Status: DC | PRN

## 2019-03-19 MED ORDER — SUGAMMADEX SODIUM 200 MG/2ML IV SOLN
INTRAVENOUS | Status: DC | PRN
  Administered 2019-03-19: 200 mg via INTRAVENOUS

## 2019-03-19 MED ORDER — LIDOCAINE 2% (20 MG/ML) 5 ML SYRINGE
INTRAMUSCULAR | Status: DC | PRN
  Administered 2019-03-19: 60 mg via INTRAVENOUS

## 2019-03-19 MED ORDER — ROCURONIUM BROMIDE 10 MG/ML (PF) SYRINGE
PREFILLED_SYRINGE | INTRAVENOUS | Status: DC | PRN
  Administered 2019-03-19 (×3): 20 mg via INTRAVENOUS
  Administered 2019-03-19: 10 mg via INTRAVENOUS
  Administered 2019-03-19: 50 mg via INTRAVENOUS

## 2019-03-19 MED ORDER — DEXAMETHASONE SODIUM PHOSPHATE 10 MG/ML IJ SOLN
INTRAMUSCULAR | Status: DC | PRN
  Administered 2019-03-19: 4 mg via INTRAVENOUS

## 2019-03-19 MED ORDER — FENTANYL CITRATE (PF) 250 MCG/5ML IJ SOLN
INTRAMUSCULAR | Status: AC
  Filled 2019-03-19: qty 5

## 2019-03-19 SURGICAL SUPPLY — 82 items
BIT DRILL 4.3 (BIT) ×2
BIT DRILL 4.3X300MM (BIT) IMPLANT
BIT DRILL LONG 3.3 (BIT) ×2 IMPLANT
BIT DRILL QC 3.3X195 (BIT) ×1 IMPLANT
BLADE CLIPPER SURG (BLADE) ×1 IMPLANT
BNDG CMPR MED 15X6 ELC VLCR LF (GAUZE/BANDAGES/DRESSINGS) ×1
BNDG ELASTIC 4X5.8 VLCR STR LF (GAUZE/BANDAGES/DRESSINGS) ×2 IMPLANT
BNDG ELASTIC 6X15 VLCR STRL LF (GAUZE/BANDAGES/DRESSINGS) ×1 IMPLANT
BNDG ELASTIC 6X5.8 VLCR STR LF (GAUZE/BANDAGES/DRESSINGS) ×2 IMPLANT
BNDG GAUZE ELAST 4 BULKY (GAUZE/BANDAGES/DRESSINGS) ×2 IMPLANT
BRUSH SCRUB EZ PLAIN DRY (MISCELLANEOUS) ×5 IMPLANT
CANISTER SUCT 3000ML PPV (MISCELLANEOUS) ×1 IMPLANT
CAP LOCK NCB (Cap) ×6 IMPLANT
COVER SURGICAL LIGHT HANDLE (MISCELLANEOUS) ×2 IMPLANT
COVER WAND RF STERILE (DRAPES) ×2 IMPLANT
DRAPE C-ARM 42X72 X-RAY (DRAPES) ×2 IMPLANT
DRAPE C-ARMOR (DRAPES) ×2 IMPLANT
DRAPE IMP U-DRAPE 54X76 (DRAPES) ×2 IMPLANT
DRAPE ORTHO SPLIT 77X108 STRL (DRAPES) ×8
DRAPE SURG ORHT 6 SPLT 77X108 (DRAPES) ×3 IMPLANT
DRAPE U-SHAPE 47X51 STRL (DRAPES) ×2 IMPLANT
DRSG ADAPTIC 3X8 NADH LF (GAUZE/BANDAGES/DRESSINGS) ×1 IMPLANT
DRSG MEPILEX BORDER 4X12 (GAUZE/BANDAGES/DRESSINGS) ×1 IMPLANT
DRSG MEPILEX BORDER 4X4 (GAUZE/BANDAGES/DRESSINGS) ×1 IMPLANT
DRSG PAD ABDOMINAL 8X10 ST (GAUZE/BANDAGES/DRESSINGS) ×4 IMPLANT
ELECT REM PT RETURN 9FT ADLT (ELECTROSURGICAL) ×2
ELECTRODE REM PT RTRN 9FT ADLT (ELECTROSURGICAL) ×1 IMPLANT
EVACUATOR 1/8 PVC DRAIN (DRAIN) IMPLANT
EVACUATOR 3/16  PVC DRAIN (DRAIN)
EVACUATOR 3/16 PVC DRAIN (DRAIN) IMPLANT
GAUZE SPONGE 4X4 12PLY STRL (GAUZE/BANDAGES/DRESSINGS) ×2 IMPLANT
GLOVE BIO SURGEON STRL SZ7.5 (GLOVE) ×2 IMPLANT
GLOVE BIO SURGEON STRL SZ8 (GLOVE) ×2 IMPLANT
GLOVE BIOGEL PI IND STRL 7.5 (GLOVE) ×1 IMPLANT
GLOVE BIOGEL PI IND STRL 8 (GLOVE) ×1 IMPLANT
GLOVE BIOGEL PI INDICATOR 7.5 (GLOVE) ×1
GLOVE BIOGEL PI INDICATOR 8 (GLOVE) ×1
GOWN STRL REUS W/ TWL LRG LVL3 (GOWN DISPOSABLE) ×2 IMPLANT
GOWN STRL REUS W/ TWL XL LVL3 (GOWN DISPOSABLE) ×1 IMPLANT
GOWN STRL REUS W/TWL LRG LVL3 (GOWN DISPOSABLE) ×4
GOWN STRL REUS W/TWL XL LVL3 (GOWN DISPOSABLE) ×2
K-WIRE 2.0 (WIRE) ×8
K-WIRE FXSTD 280X2XNS SS (WIRE) ×4
KIT BASIN OR (CUSTOM PROCEDURE TRAY) ×2 IMPLANT
KIT TURNOVER KIT B (KITS) ×2 IMPLANT
KWIRE FXSTD 280X2XNS SS (WIRE) IMPLANT
NEEDLE 22X1 1/2 (OR ONLY) (NEEDLE) IMPLANT
NS IRRIG 1000ML POUR BTL (IV SOLUTION) ×2 IMPLANT
PACK TOTAL JOINT (CUSTOM PROCEDURE TRAY) ×2 IMPLANT
PACK UNIVERSAL I (CUSTOM PROCEDURE TRAY) ×1 IMPLANT
PAD ABD 8X10 STRL (GAUZE/BANDAGES/DRESSINGS) ×1 IMPLANT
PAD ARMBOARD 7.5X6 YLW CONV (MISCELLANEOUS) ×4 IMPLANT
PAD CAST 4YDX4 CTTN HI CHSV (CAST SUPPLIES) ×1 IMPLANT
PADDING CAST COTTON 4X4 STRL (CAST SUPPLIES) ×2
PADDING CAST COTTON 6X4 STRL (CAST SUPPLIES) ×2 IMPLANT
PLATE BONE LOCK 238MM 9HOLE (Plate) ×1 IMPLANT
SCREW 5.0 80MM (Screw) ×2 IMPLANT
SCREW CORTICAL NCB 5.0X90MM (Screw) ×2 IMPLANT
SCREW NCB 4.0MX34M (Screw) ×1 IMPLANT
SCREW NCB 4.0MX38M (Screw) ×1 IMPLANT
SCREW NCB 4.0MX44M (Screw) ×1 IMPLANT
SCREW NCB 4.0MX48M (Screw) ×1 IMPLANT
SCREW NCB 4.0X36MM (Screw) ×1 IMPLANT
SCREW NCB 4.0X40MM (Screw) ×4 IMPLANT
SCREW NCB 5.0X85MM (Screw) ×3 IMPLANT
SPONGE LAP 18X18 RF (DISPOSABLE) ×2 IMPLANT
STAPLER VISISTAT 35W (STAPLE) ×2 IMPLANT
SUCTION FRAZIER HANDLE 10FR (MISCELLANEOUS)
SUCTION TUBE FRAZIER 10FR DISP (MISCELLANEOUS) ×1 IMPLANT
SUT ETHILON 2 0 PSLX (SUTURE) ×2 IMPLANT
SUT PROLENE 0 CT 2 (SUTURE) ×1 IMPLANT
SUT VIC AB 0 CT1 27 (SUTURE) ×4
SUT VIC AB 0 CT1 27XBRD ANBCTR (SUTURE) ×2 IMPLANT
SUT VIC AB 1 CT1 27 (SUTURE) ×4
SUT VIC AB 1 CT1 27XBRD ANBCTR (SUTURE) ×2 IMPLANT
SUT VIC AB 2-0 CT1 27 (SUTURE) ×2
SUT VIC AB 2-0 CT1 TAPERPNT 27 (SUTURE) ×2 IMPLANT
SYR 20ML ECCENTRIC (SYRINGE) IMPLANT
TOWEL GREEN STERILE (TOWEL DISPOSABLE) ×3 IMPLANT
TOWEL GREEN STERILE FF (TOWEL DISPOSABLE) ×2 IMPLANT
TRAY FOLEY MTR SLVR 16FR STAT (SET/KITS/TRAYS/PACK) ×1 IMPLANT
WATER STERILE IRR 1000ML POUR (IV SOLUTION) ×3 IMPLANT

## 2019-03-19 NOTE — Plan of Care (Signed)
  Problem: Clinical Measurements: Goal: Will remain free from infection Outcome: Progressing   Problem: Pain Managment: Goal: General experience of comfort will improve Outcome: Progressing   Problem: Safety: Goal: Ability to remain free from injury will improve Outcome: Progressing   Problem: Skin Integrity: Goal: Risk for impaired skin integrity will decrease Outcome: Progressing   

## 2019-03-19 NOTE — Progress Notes (Signed)
Family Medicine Teaching Service Daily Progress Note Intern Pager: [phone]  Patient name: [name] Medical record number: [id number] Date of birth: 12/17/1945 Age: 73 y.o. Gender: male  Primary Care Provider: Jeanie Sewer, DO Consultants: Ortho  Code Status: FULL Pt Overview and Major Events to Date:  11/30-admitted  12/01-Periprosthetic femur fracture revision  Assessment and Plan:  Periprosthetic femur fracture 2/2 medical fall Day 0 postop revision of left periprosthetic femur fracture Vit D 33, on the lower limit of normal.  -Orthopedic sugery following, appreciate recommendations  -PT/OT: eval and treat after surgery and have weight bearing status -Vitals per routine  -Tylenol 650mg  Q6H PRN -Oxycodone IR 5 mg Q6H PRN for severe pain -Morphine 2 mg Q6H PRN breakthrough pain -Zofran every 6 hours as needed -CBC am to monitor Hb post op  -Hold heparin today, consider restarting on 12/2 -SCDs on remaining right leg  AKI-worsening Cr 1.33 today, Cr 1.25 on admission with baseline ~0.9-1.0 -Avoid nephrotoxic agents -Repeat BMP a.m. -Continue to hold Torsemide, restart on 12/2 if Cr improves  Hypokalemia-resolved  K  3.5 today, 3.4 on 11/30. -AM BMP  CAD w/ stents  Hx of MI-stable  Home meds: 81 mg aspirin daily, Lipitor 80 mg daily.  Cardiology cleared patient for surgery today. -New home meds  T2DM  CBG 233, 280, glucose on admission 300 Home meds: Victoza 1.8 mg in the morning, Metformin 500 mg twice daily and acarbose 100 mg 3 times daily.   -Hold home medications -Monitor CBGs with meals and bedtime -Increase Lantus to 12 units tonight -Continue sensitive Sliding scale insulin  COPD  Prior CPAP at night.  Follows with Dr. Arlis Porta. Home medications Symbicort and Albuterol inhaler.  Denies increasing sputum production and worsening cough.  Has not needed rescue inhaler recently. Ruthe Mannan daily -Incentive spirometry   HTN  BP  140-170s. Home meds include Coreg 25 mg twice daily, Imdur 30 mg daily, amlodipine 10 mg and  torsemide 40 mg daily. Not taking lisinopril as developed a cough. -Continue to monitor BP post op -Continue home Coreg, Imdur, torsemide  GERD  Home meds:  Protonix 40 mg daily -Continue Protinix   Gout  Home meds: allopurinol 300 mg am -Continue allopurinol   Hypercholesterolemia  Home meds: 80 mg atorvastatin -Continue statin  Peripheral neuropathy Home medications include gabapentin 600 mg at bedtime. - Continue Gabapentin   Allergic rhinitis Home medications include Atrovent, Xyzal 5 mg at bedtime, Singulair 10 mg daily, -Continue Singulair  Insomnia Home medications include 10 mg Ambien at bedtime. -Continue home medication  FEN/GI: normal diet  Prophylaxis: Heparin  Disposition: home pending PT/OT  Subjective:  Per attending  Objective: Temp:  [97.5 F (36.4 C)-99.5 F (37.5 C)] 98.2 F (36.8 C) (12/01 1314) Pulse Rate:  [75-84] 84 (12/01 1314) Resp:  [14-20] 18 (12/01 1314) BP: (133-172)/(45-78) 153/76 (12/01 1314) SpO2:  [92 %-100 %] 94 % (12/01 1314) Weight:  [112.3 kg-113.3 kg] 113.3 kg (12/01 0108)  Examination Per attending  Laboratory: Recent Labs  Lab 03/18/19 1200 03/19/19 0557  WBC 14.1* 9.9  HGB 13.6 11.8*  HCT 38.6* 34.1*  PLT 181 149*   Recent Labs  Lab 03/18/19 1200 03/19/19 0557 03/19/19 1305  NA 139 137  --   K 3.4* 3.5  --   CL 100 102  --   CO2 27 26  --   BUN 12 12  --   CREATININE 1.25* 1.21 1.33*  CALCIUM 9.0 8.3*  --   PROT  7.3 6.3*  --   BILITOT 1.0 0.8  --   ALKPHOS 78 62  --   ALT 19 16  --   AST 25 19  --   GLUCOSE 326* 394*  --      Imaging/Diagnostic Tests: Dg Knee Complete 4 Views Left  Result Date: 03/19/2019 CLINICAL DATA:  ORIF LEFT distal femur. EXAM: LEFT KNEE - COMPLETE 4+ VIEW; DG C-ARM 1-60 MIN COMPARISON:  Plain film of the LEFT femur dated 03/18/2019. FINDINGS: Five intraoperative  fluoroscopic images of the distal LEFT femur showing placement of a plate and screw fixation hardware apparatus which traverses the distal femur fracture site. Hardware appears intact and appropriately positioned. Fluoroscopy provided for 2 minutes and 18 seconds. IMPRESSION: Intraoperative fluoroscopic images demonstrating placement of a plate and screw fixation hardware which traverses the distal femur fracture site. No evidence of surgical complicating feature. Electronically Signed   By: Franki Cabot M.D.   On: 03/19/2019 12:47   Dg Knee Left Port  Result Date: 03/19/2019 CLINICAL DATA:  Status post fixation distal LEFT femur. EXAM: PORTABLE LEFT KNEE - 1-2 VIEW COMPARISON:  Plain film of the LEFT femur dated 03/18/2019. FINDINGS: Interval placement of plate and screw fixation hardware traversing the distal LEFT femur fracture site. Hardware appears intact and appropriately positioned. Expected postsurgical changes within the overlying soft tissues. LEFT knee arthroplasty hardware appears intact and appropriately aligned. IMPRESSION: Status post fixation of the distal LEFT femur fracture site. Hardware appears intact and appropriately positioned. No evidence of surgical complicating feature. Electronically Signed   By: Franki Cabot M.D.   On: 03/19/2019 12:50   Dg C-arm 1-60 Min  Result Date: 03/19/2019 CLINICAL DATA:  ORIF LEFT distal femur. EXAM: LEFT KNEE - COMPLETE 4+ VIEW; DG C-ARM 1-60 MIN COMPARISON:  Plain film of the LEFT femur dated 03/18/2019. FINDINGS: Five intraoperative fluoroscopic images of the distal LEFT femur showing placement of a plate and screw fixation hardware apparatus which traverses the distal femur fracture site. Hardware appears intact and appropriately positioned. Fluoroscopy provided for 2 minutes and 18 seconds. IMPRESSION: Intraoperative fluoroscopic images demonstrating placement of a plate and screw fixation hardware which traverses the distal femur fracture site. No  evidence of surgical complicating feature. Electronically Signed   By: Franki Cabot M.D.   On: 03/19/2019 12:47    Lattie Haw, MD 03/19/2019, 4:33 PM PGY-1, [facility] Intern pager: [phone], text pages welcome

## 2019-03-19 NOTE — Progress Notes (Signed)
Inpatient Diabetes Program Recommendations  AACE/ADA: New Consensus Statement on Inpatient Glycemic Control   Target Ranges:  Prepandial:   less than 140 mg/dL      Peak postprandial:   less than 180 mg/dL (1-2 hours)      Critically ill patients:  140 - 180 mg/dL   Results for [name], [name] (MRN [id number]) as of 03/19/2019 09:34  Ref. Range 03/18/2019 18:49 03/18/2019 22:29 03/19/2019 05:48 03/19/2019 07:01  Glucose-Capillary Latest Ref Range: 70 - 99 mg/dL 202 (H) 342 (H) 400 (H) 280 (H)  Results for [name], [name] (MRN [id number]) as of 03/19/2019 09:34  Ref. Range 03/18/2019 18:00  Hemoglobin A1C Latest Ref Range: 4.8 - 5.6 % 8.8 (H)   Review of Glycemic Control  Diabetes history: DM2 Outpatient Diabetes medications: Acarbose 100 mg TID with meals, Victoza 1.8 mg QAM, Metformin 500 mg BID Current orders for Inpatient glycemic control: Novolog 0-9 units TID with meals  Inpatient Diabetes Program Recommendations:   Insulin - Basal: Patient received Lantus 8 units on 11/30 at 18:52. Please consider ordering Lantus 12 units daily.   Correction (SSI): Please consider increasing Novolog correction to moderate scale (0-15 units) TID with meals and adding Novolog 0-5 units QHS for bedtime correction.  Insulin - Meal Coverage: Once diet resumed, please consider ordering Novolog 3 units TID with meals for meal coverage if patient eats at least 50% of meals.  HgbA1C: A1C 8.8% on 03/18/19 indicating an average glucose of 206 mg/dl over the past 2-3 months.  NOTE: Noted patient received Decadron 4 mg today at 9am which is likely going to contribute to hyperglycemia.   Thanks, Barnie Alderman, RN, MSN, CDE Diabetes Coordinator Inpatient Diabetes Program [phone] (Team Pager from 8am to 5pm)

## 2019-03-19 NOTE — Progress Notes (Signed)
Patient seen after surgery in room.  Patient lying flat, on 2 L per nasal cannula, no increased work of breathing.  Awake and alert.  Patient denies any complaints at present.  States that he is feeling well.  Vital signs stable this time, will continue to monitor.  [place] Constable, D.O.  PGY-2 Family Medicine  03/19/2019 5:32 PM

## 2019-03-19 NOTE — Op Note (Signed)
03/19/2019  9:55 PM  PATIENT:  [name]  73 y.o. male  PRE-OPERATIVE DIAGNOSIS:  LEFT SUPRACONDYLAR PERIPROSTHETIC DISTAL FEMUR FRACTURE  POST-OPERATIVE DIAGNOSIS:  LEFT SUPRACONDYLAR PERIPROSTHETIC DISTAL FEMUR FRACTURE  PROCEDURE:  Procedure(s): OPEN REDUCTION INTERNAL FIXATION (ORIF) DISTAL FEMUR FRACTURE (Left)  SURGEON:  Surgeon(s) and Role:    Altamese , MD - Primary  ASSISTANT: NONE  ANESTHESIA:   general  EBL:  100 mL   BLOOD ADMINISTERED:none  DRAINS: none   LOCAL MEDICATIONS USED:  NONE  SPECIMEN:  No Specimen  DISPOSITION OF SPECIMEN:  N/A  COUNTS:  YES  TOURNIQUET:  * No tourniquets in log *  DICTATION: .Note written in EPIC  PLAN OF CARE: Admit to inpatient   PATIENT DISPOSITION:  PACU - hemodynamically stable.   Delay start of Pharmacological VTE agent (>24hrs) due to surgical blood loss or risk of bleeding: no  BRIEF SUMMARY OF INDICATION FOR PROCEDURE:  [name] is a 73 y.o. who sustained a fall resulting in severely comminuted supracondylar femur fracture, which was quite distal and near to the femoral implant. The risks and benefits of surgery were discussed with the patient and family, including the possibility of infection, nerve injury, vessel injury, wound breakdown, arthritis, symptomatic hardware, DVT/ PE, loss of motion, malunion, nonunion, heart attack, stroke, death, and need for further surgery among others.  These risks were acknowledged and consent provided to proceed.   BRIEF SUMMARY OF PROCEDURE:  The patient was taken to the operating room where general anesthesia was induced and after receipt of preoperative antibiotics.  The left lower extremity was prepped and draped in usual sterile fashion.  No tourniquet was used during the procedure.  A radiolucent triangle was placed underneath the femur and towel bumps to restore appropriate alignment and length. C-arm was brought in to confirm the appropriate  position of the distal incision.  This was checked on lateral as well.  An incision was then made.  Dissection was carried down to the IT band.  It was split in line with the incision.  The deep protractor was placed.  Hematoma evacuated. My scrub assistant pulled and maintained traction and dialed in the rotation for alignment.  I then introduced the [employer] NCB plate.  I placed a pin distally parallel to the femoral component and joint line of the tibial tray and then checked the position on both AP and lateral views proximally, placing a single screw.  This was tightened while adjusting distally to make sure that proper alignment was maintained throughout.  I then placed an additional proximal screw and sequentially tightened each until the alignment was dialed in.  I then placed multiple screws in the articular block, checking their trajectory and alignment with fluoro.  Additional standard screws were placed proximally.  Locking caps were placed over the heads of the screws distally and all but the most proximal screw in the proximal segment, as well, after confirming appropriate position of all screws on orthogonal views.  Wounds were irrigated thoroughly and then closed in standard layered fashion using #1 Vicryl for the tensor, 0 Vicryl for the deep subcu, 2-0 Vicryl and 3-0 vertical mattress sutures for the skin.  A sterile gently compressive dressing was then applied with an Ace wrap from foot to thigh as well as a knee immobilizer until the patient wakes up adequately from anesthesia at which time she will be allowed unrestricted range of motion.  PROGNOSIS:  Because of comorbidities, patient is at increased  risk for perioperative complications. PT/ OT to assist with touch down weightbearing and unrestricted range of motion of the knee without bracing.  Formal pharmacologic DVT prophylaxis with Lovenox. F/u in 10-14 days for removal of sutures.     Astrid Divine. Marcelino Scot, M.D.

## 2019-03-19 NOTE — Progress Notes (Signed)
Orthopedic Tech Progress Note Patient Details:  [name] [birth year] [id number] I applied a Over Head Frame with Trapeze  Patient ID: [name], male   DOB: [birth year], 73 y.o.   MRN: [id number]   [name] 03/19/2019, 5:57 PM

## 2019-03-19 NOTE — Anesthesia Procedure Notes (Signed)
Procedure Name: Intubation Date/Time: 03/19/2019 8:26 AM Performed by: Valda Favia, CRNA Pre-anesthesia Checklist: Patient identified, Emergency Drugs available, Suction available, Patient being monitored and Timeout performed Patient Re-evaluated:Patient Re-evaluated prior to induction Oxygen Delivery Method: Circle system utilized Preoxygenation: Pre-oxygenation with 100% oxygen Induction Type: IV induction Ventilation: Mask ventilation without difficulty and Oral airway inserted - appropriate to patient size Laryngoscope Size: Mac and 4 Grade View: Grade I Tube type: Oral Tube size: 7.5 mm Number of attempts: 1 Airway Equipment and Method: Stylet Placement Confirmation: ETT inserted through vocal cords under direct vision,  positive ETCO2 and breath sounds checked- equal and bilateral Secured at: 21 cm Tube secured with: Tape Dental Injury: Teeth and Oropharynx as per pre-operative assessment

## 2019-03-19 NOTE — Transfer of Care (Signed)
Immediate Anesthesia Transfer of Care Note  Patient: [name]  Procedure(s) Performed: OPEN REDUCTION INTERNAL FIXATION (ORIF) DISTAL FEMUR FRACTURE (Left Knee)  Patient Location: PACU  Anesthesia Type:General  Level of Consciousness: awake, alert  and oriented  Airway & Oxygen Therapy: Patient Spontanous Breathing  Post-op Assessment: Report given to RN and Post -op Vital signs reviewed and stable  Post vital signs: Reviewed and stable  Last Vitals:  Vitals Value Taken Time  BP 154/54 03/19/19 1157  Temp    Pulse 79 03/19/19 1201  Resp 14 03/19/19 1201  SpO2 95 % 03/19/19 1201  Vitals shown include unvalidated device data.  Last Pain:  Vitals:   03/19/19 0616  TempSrc:   PainSc: 3          Complications: No apparent anesthesia complications

## 2019-03-19 NOTE — Anesthesia Preprocedure Evaluation (Addendum)
Anesthesia Evaluation  Patient identified by MRN, date of birth, ID band Patient awake    Reviewed: Allergy & Precautions, NPO status , Patient's Chart, lab work & pertinent test results  Airway Mallampati: I  TM Distance: >3 FB Neck ROM: Full    Dental  (+) Edentulous Upper, Edentulous Lower   Pulmonary asthma , former smoker,    breath sounds clear to auscultation       Cardiovascular hypertension, Pt. on medications and Pt. on home beta blockers + CAD and + Cardiac Stents   Rhythm:Regular Rate:Normal     Neuro/Psych negative neurological ROS  negative psych ROS   GI/Hepatic Neg liver ROS, GERD  Medicated,  Endo/Other  diabetes, Type 2, Oral Hypoglycemic Agents  Renal/GU negative Renal ROS     Musculoskeletal   Abdominal Normal abdominal exam  (+)   Peds  Hematology negative hematology ROS (+)   Anesthesia Other Findings   Reproductive/Obstetrics                            Anesthesia Physical Anesthesia Plan  ASA: III  Anesthesia Plan: General   Post-op Pain Management:    Induction: Intravenous  PONV Risk Score and Plan: 3 and Ondansetron, Dexamethasone, Treatment may vary due to age or medical condition and Midazolam  Airway Management Planned: Oral ETT  Additional Equipment: None  Intra-op Plan:   Post-operative Plan: Extubation in OR  Informed Consent: I have reviewed the patients History and Physical, chart, labs and discussed the procedure including the risks, benefits and alternatives for the proposed anesthesia with the patient or authorized representative who has indicated his/her understanding and acceptance.     Dental advisory given  Plan Discussed with: CRNA  Anesthesia Plan Comments:         Anesthesia Quick Evaluation

## 2019-03-20 ENCOUNTER — Encounter (HOSPITAL_COMMUNITY): Payer: Self-pay | Admitting: Orthopedic Surgery

## 2019-03-20 DIAGNOSIS — I10 Essential (primary) hypertension: Secondary | ICD-10-CM

## 2019-03-20 DIAGNOSIS — E119 Type 2 diabetes mellitus without complications: Secondary | ICD-10-CM

## 2019-03-20 LAB — URINALYSIS, ROUTINE W REFLEX MICROSCOPIC
Bacteria, UA: NONE SEEN
Bilirubin Urine: NEGATIVE
Glucose, UA: >=500 mg/dL — AB
Hgb urine dipstick: NEGATIVE
Ketones, ur: NEGATIVE mg/dL
Leukocytes,Ua: NEGATIVE
Nitrite: NEGATIVE
Protein, ur: NEGATIVE mg/dL
Specific Gravity, Urine: 1.024 (ref 1.005–1.030)
pH: 6 (ref 5.0–8.0)

## 2019-03-20 LAB — COMPREHENSIVE METABOLIC PANEL
ALT: 14 U/L (ref 0–44)
AST: 19 U/L (ref 15–41)
Albumin: 3 g/dL — ABNORMAL LOW (ref 3.5–5.0)
Alkaline Phosphatase: 60 U/L (ref 38–126)
Anion gap: 9 (ref 5–15)
BUN: 19 mg/dL (ref 8–23)
CO2: 28 mmol/L (ref 22–32)
Calcium: 8.3 mg/dL — ABNORMAL LOW (ref 8.9–10.3)
Chloride: 102 mmol/L (ref 98–111)
Creatinine, Ser: 1.3 mg/dL — ABNORMAL HIGH (ref 0.61–1.24)
GFR calc Af Amer: >60 mL/min (ref >60)
GFR calc non Af Amer: 55 mL/min — ABNORMAL LOW (ref >60)
Glucose, Bld: 316 mg/dL — ABNORMAL HIGH (ref 70–99)
Potassium: 4.3 mmol/L (ref 3.5–5.1)
Sodium: 139 mmol/L (ref 135–145)
Total Bilirubin: 0.4 mg/dL (ref 0.3–1.2)
Total Protein: 6 g/dL — ABNORMAL LOW (ref 6.5–8.1)

## 2019-03-20 LAB — GLUCOSE, CAPILLARY
Glucose-Capillary: 317 mg/dL — ABNORMAL HIGH (ref 70–99)
Glucose-Capillary: 384 mg/dL — ABNORMAL HIGH (ref 70–99)
Glucose-Capillary: 351 mg/dL — ABNORMAL HIGH (ref 70–99)
Glucose-Capillary: 311 mg/dL — ABNORMAL HIGH (ref 70–99)

## 2019-03-20 LAB — CBC
HCT: 28.9 % — ABNORMAL LOW (ref 39.0–52.0)
Hemoglobin: 9.7 g/dL — ABNORMAL LOW (ref 13.0–17.0)
MCH: 32.3 pg (ref 26.0–34.0)
MCHC: 33.6 g/dL (ref 30.0–36.0)
MCV: 96.3 fL (ref 80.0–100.0)
Platelets: 138 10*3/uL — ABNORMAL LOW (ref 150–400)
RBC: 3 MIL/uL — ABNORMAL LOW (ref 4.22–5.81)
RDW: 13.3 % (ref 11.5–15.5)
WBC: 13.2 10*3/uL — ABNORMAL HIGH (ref 4.0–10.5)
nRBC: 0 % (ref 0.0–0.2)

## 2019-03-20 MED ORDER — VITAMIN C 500 MG PO TABS
1000.0000 mg | ORAL_TABLET | Freq: Every day | ORAL | Status: DC
  Administered 2019-03-20 – 2019-03-23 (×4): 1000 mg via ORAL
  Filled 2019-03-20 (×4): qty 2

## 2019-03-20 MED ORDER — POLYETHYLENE GLYCOL 3350 17 G PO PACK
17.0000 g | PACK | Freq: Every day | ORAL | Status: DC
  Filled 2019-03-20 (×2): qty 1

## 2019-03-20 MED ORDER — VITAMIN D 25 MCG (1000 UNIT) PO TABS
2000.0000 [IU] | ORAL_TABLET | Freq: Every day | ORAL | Status: DC
  Administered 2019-03-20 – 2019-03-23 (×4): 2000 [IU] via ORAL
  Filled 2019-03-20 (×4): qty 2

## 2019-03-20 MED ORDER — INSULIN GLARGINE 100 UNIT/ML ~~LOC~~ SOLN
15.0000 [IU] | Freq: Every day | SUBCUTANEOUS | Status: DC
  Administered 2019-03-20: 15 [IU] via SUBCUTANEOUS
  Filled 2019-03-20 (×2): qty 0.15

## 2019-03-20 MED ORDER — INSULIN ASPART 100 UNIT/ML ~~LOC~~ SOLN
0.0000 [IU] | Freq: Three times a day (TID) | SUBCUTANEOUS | Status: DC
  Administered 2019-03-20 – 2019-03-21 (×3): 15 [IU] via SUBCUTANEOUS
  Administered 2019-03-21: 8 [IU] via SUBCUTANEOUS

## 2019-03-20 MED ORDER — OXYCODONE HCL 5 MG PO TABS
5.0000 mg | ORAL_TABLET | ORAL | Status: DC | PRN
  Administered 2019-03-20 (×2): 10 mg via ORAL
  Administered 2019-03-20: 5 mg via ORAL
  Administered 2019-03-21 – 2019-03-23 (×9): 10 mg via ORAL
  Filled 2019-03-20 (×6): qty 2
  Filled 2019-03-20: qty 1
  Filled 2019-03-20 (×6): qty 2

## 2019-03-20 NOTE — Progress Notes (Signed)
Orthopedic Tech Progress Note Patient Details:  [name] 06-11-1945 [id number] Called in order to [facility] for a ROM BRACE for patient. Patient ID: [name], male   DOB: [birth year], 73 y.o.   MRN: [id number]   [name] 03/20/2019, 9:53 AM

## 2019-03-20 NOTE — Evaluation (Signed)
Physical Therapy Evaluation Patient Details Name: [name] MRN: [id number] DOB: 02/01/1946 Today's Date: 03/20/2019   History of Present Illness  [name] is a 73 y.o. M s/p L distal femur ORIF. He has a PMH including but not limited to COPD, CAD with stent placement, HTN, gout, and diabetes type 2.   Clinical Impression  Pt admitted for above diagnosis. Pt required cueing initially to maintain TDWB status during transfers, but maintained WB status afterwards. He also required cueing for hand placement with walker, as he has tendency to try to push from forearm on L UE. Pt notes he does not wear oxygen during the day at home, but his O2 sats dropped to 87-88% immediately following mobility and he was breathing heavily during gait. His sats recovered within 1 min to 95%. He has deficits in L LE balance, strength, ROM, gait, and overall functional mobility. Recommend SNF due to oxygen desat with mobility and since he will not have help at home since wife is also in SNF after LE surgery. Recommend RW for home. Pt would benefit from continued skilled acute PT intervention in order to address deficits.     Follow Up Recommendations SNF;Supervision for mobility/OOB    Equipment Recommendations  Rolling walker with 5" wheels    Recommendations for Other Services       Precautions / Restrictions Precautions Precautions: Fall Required Braces or Orthoses: Other Brace(hinged knee brace- unlocked) Restrictions Weight Bearing Restrictions: Yes LLE Weight Bearing: Touchdown weight bearing      Mobility  Bed Mobility Overal bed mobility: Needs Assistance Bed Mobility: Supine to Sit     Supine to sit: Mod assist;HOB elevated     General bed mobility comments: mod assist to bring L LE off bed  Transfers Overall transfer level: Needs assistance Equipment used: Rolling walker (2 wheeled) Transfers: Sit to/from [facility] Sit to Stand: Min guard;Min assist Stand  pivot transfers: Min assist       General transfer comment: Pt completed 2 sit to stands, the first requiring min assist and the second requiring min guard. He completed stand pivot transfer min guard for safety.   Ambulation/Gait Ambulation/Gait assistance: Min guard Gait Distance (Feet): 8 Feet Assistive device: Rolling walker (2 wheeled) Gait Pattern/deviations: Antalgic;Step-to pattern;Decreased stride length Gait velocity: decreased   General Gait Details: Pt was able to walk 8 ft min guard while maintaining TDWB status. He demonstrated some shortness of breath during activity  Stairs            Wheelchair Mobility    Modified Rankin (Stroke Patients Only)       Balance Overall balance assessment: Needs assistance Sitting-balance support: No upper extremity supported;Feet supported Sitting balance-Leahy Scale: Good Sitting balance - Comments: maintained sitting balance EOB for donning/doffing of gait belt/gown   Standing balance support: Bilateral upper extremity supported;During functional activity Standing balance-Leahy Scale: Poor Standing balance comment: requires B UE support for standing balance                             Pertinent Vitals/Pain Pain Assessment: Faces Faces Pain Scale: Hurts a little bit Pain Location: L leg Pain Descriptors / Indicators: Aching;Guarding;Grimacing;Sore Pain Intervention(s): Limited activity within patient's tolerance;Monitored during session;Repositioned    Home Living Family/patient expects to be discharged to:: Private residence Living Arrangements: Spouse/significant other Available Help at Discharge: Family Type of Home: House Home Access: Ramped entrance     Home Layout: One  level Home Equipment: None;Grab bars - tub/shower      Prior Function Level of Independence: Independent               Hand Dominance   Dominant Hand: Right    Extremity/Trunk Assessment        Lower Extremity  Assessment Lower Extremity Assessment: LLE deficits/detail LLE Deficits / Details: s/p distal femur ORIF; pain, weakness, decreased ROM       Communication   Communication: No difficulties  Cognition Arousal/Alertness: Awake/alert Behavior During Therapy: WFL for tasks assessed/performed Overall Cognitive Status: Within Functional Limits for tasks assessed                                        General Comments General comments (skin integrity, edema, etc.): Pt's O2 sats dropped to 87 after gait/transfer. Recovered to 95 when back in chair within 1 minute    Exercises     Assessment/Plan    PT Assessment Patient needs continued PT services  PT Problem List Decreased strength;Decreased activity tolerance;Decreased range of motion;Decreased balance;Decreased mobility;Decreased coordination;Decreased knowledge of use of DME;Decreased safety awareness;Decreased knowledge of precautions;Cardiopulmonary status limiting activity;Pain       PT Treatment Interventions Gait training;DME instruction;Stair training;Functional mobility training;Therapeutic activities;Therapeutic exercise;Balance training;Patient/family education;Modalities;Wheelchair mobility training;Neuromuscular re-education    PT Goals (Current goals can be found in the Care Plan section)  Acute Rehab PT Goals Patient Stated Goal: to decrease pain PT Goal Formulation: With patient Time For Goal Achievement: 04/03/19 Potential to Achieve Goals: Good    Frequency Min 3X/week   Barriers to discharge        Co-evaluation               AM-PAC PT "6 Clicks" Mobility  Outcome Measure Help needed turning from your back to your side while in a flat bed without using bedrails?: A Little Help needed moving from lying on your back to sitting on the side of a flat bed without using bedrails?: A Lot Help needed moving to and from a bed to a chair (including a wheelchair)?: A Little Help needed standing  up from a chair using your arms (e.g., wheelchair or bedside chair)?: A Little Help needed to walk in hospital room?: A Little Help needed climbing 3-5 steps with a railing? : A Lot 6 Click Score: 16    End of Session Equipment Utilized During Treatment: Gait belt Activity Tolerance: Patient tolerated treatment well;Patient limited by fatigue Patient left: in chair;with call bell/phone within reach;with chair alarm set Nurse Communication: Mobility status PT Visit Diagnosis: Unsteadiness on feet (R26.81);Other abnormalities of gait and mobility (R26.89);Muscle weakness (generalized) (M62.81);Pain Pain - Right/Left: Left Pain - part of body: Leg    Time: 1054-1130 PT Time Calculation (min) (ACUTE ONLY): 36 min   Charges:   PT Evaluation $PT Eval Moderate Complexity: 1 Mod PT Treatments $Gait Training: 8-22 mins        Silvana Newness, SPT   [place] Shae Hinnenkamp 03/20/2019, 12:19 PM

## 2019-03-20 NOTE — Plan of Care (Signed)

## 2019-03-20 NOTE — Progress Notes (Addendum)
Inpatient Diabetes Program Recommendations  AACE/ADA: New Consensus Statement on Inpatient Glycemic Control   Target Ranges:  Prepandial:   less than 140 mg/dL      Peak postprandial:   less than 180 mg/dL (1-2 hours)      Critically ill patients:  140 - 180 mg/dL   Results for DONATO, BRODERSON (MRN [id number]) as of 03/20/2019 08:55  Ref. Range 03/19/2019 07:01 03/19/2019 11:58 03/19/2019 16:56 03/19/2019 21:11 03/20/2019 06:33  Glucose-Capillary Latest Ref Range: 70 - 99 mg/dL 280 (H) 233 (H) 369 (H) 401 (H) 317 (H)   Review of Glycemic Control  Diabetes history: DM2 Outpatient Diabetes medications: Acarbose 100 mg TID with meals, Victoza 1.8 mg QAM, Metformin 500 mg BID Current orders for Inpatient glycemic control: Lantus 12 units daily, Novolog 0-9 units TID with meals  Inpatient Diabetes Program Recommendations:   Insulin - Basal:  Please consider increasing Lantus to 16 units daily.   Correction (SSI): Please consider increasing Novolog correction to moderate scale (0-15 units) TID with meals and adding Novolog 0-5 units QHS for bedtime correction.  Insulin - Meal Coverage: Please consider ordering Novolog 3 units TID with meals for meal coverage if patient eats at least 50% of meals.  Diet: Please discontinue Regular diet and order Carb Modified diet.  NOTE: Noted patient received Decadron 4 mg at 9am on 03/19/19 which is contributing to hyperglycemia.  Thanks, Barnie Alderman, RN, MSN, CDE Diabetes Coordinator Inpatient Diabetes Program [phone] (Team Pager from 8am to 5pm)

## 2019-03-20 NOTE — Progress Notes (Signed)
Family Medicine Teaching Service Daily Progress Note Intern Pager: [phone]  Patient name: VIRGINIO MCAULEY Medical record number: [id number] Date of birth: 05/10/1945 Age: 73 y.o. Gender: male  Primary Care Provider: Jeanie Sewer, DO Consultants: Ortho  Code Status: FULL Pt Overview and Major Events to Date:  11/30-admitted  12/01-Periprosthetic femur fracture revision  Assessment and Plan:  Periprosthetic femur fracture 2/2 medical fall Pt doing well this morning. reports pain in left LE but adequately controlled with analgesia. On examination left leg is in boot, no obvious erythema or edema.  Day 1 postop ORIF for left distal femur fracture -Orthopedic sugery following, appreciate recommendations  -PT/OT: eval and treat after surgery and have weight bearing status -Vitals per routine  -Tylenol 650mg  Q6H PRN -Oxycodone IR 5 mg Q6H PRN for severe pain -Zofran every 6 hours as needed -CBC am to monitor Hb post op  -Restart heparin today  AKI-stable Cr 1.3 today, Cr 1.33 on 12/1 today,baseline ~0.9-1.0 -Avoid nephrotoxic agents -Repeat BMP a.m. -Continue to hold Torsemide, restart on 12/2 if Cr improves  Dysuria Pt has dysuria for the past 2 days, started prior to the mechanical fall. On examination has LIF tenderness, no guarding. Bowel sounds present. -UA    HFpEF Last echo 2017: LV EF 55-60% Denies SOB/chest pain  Urine output 1.67 L  Weight on 12/1 113.3 kgs weight on admission 114.3 kgs  -Judicious IV fluids, d/c IV fluids  -Hold Torsemide, Restart if Cr improving tomorrow   Hypokalemia-resolved K 4.3 today, K 3.5 on 12/1 -AM BMP  CAD w/ stents  Hx of MI-stable  Home meds: 81 mg aspirin daily, Lipitor 80 mg daily.  Cardiology cleared patient for surgery today. -Continue home meds  T2DM  CBG 233-401 over last 24 hours  Home meds: Victoza 1.8 mg in the morning, Metformin 500 mg BID and acarbose 100 mg TID.   -Hold home medications -Monitor CBGs  with meals and bedtime -Increased Lantus to 15 units -Continue sensitive Sliding scale insulin  COPD  -Denies shortness of breath or wheeze Prior CPAP at night.  Follows with Dr. Arlis Porta. Home medications Symbicort and Albuterol inhaler.Has not needed rescue inhaler recently. Ruthe Mannan daily -Incentive spirometry   HTN  BP [id number] systolic Home meds include Coreg 25 mg twice daily, Imdur 30 mg daily, amlodipine 10 mg and  torsemide 40 mg daily. Not taking lisinopril as developed a cough. -Continue to monitor BP post op -Continue home Coreg, Imdur, torsemide  GERD  Home meds:  Protonix 40 mg daily -Continue Protinix   Gout  Home meds: allopurinol 300 mg am -Continue allopurinol   Hypercholesterolemia  Home meds: 80 mg atorvastatin -Continue statin  Peripheral neuropathy Home medications include gabapentin 600 mg at bedtime. - Continue Gabapentin   Allergic rhinitis Home medications include Atrovent, Xyzal 5 mg at bedtime, Singulair 10 mg daily, -Continue Singulair  Insomnia Home medications include 10 mg Ambien at bedtime. -Continue home medication  FEN/GI: normal diet  Prophylaxis: Heparin  Disposition: home pending PT/OT  Subjective:  Pt doing well post operatively. Reports left leg pain is well controlled with oxycodone. Dysuria as above.  Objective: Temp:  [98 F (36.7 C)-99.3 F (37.4 C)] 98.8 F (37.1 C) (12/02 1333) Pulse Rate:  [70-87] 79 (12/02 1333) Resp:  [16-20] 18 (12/02 1333) BP: (113-147)/(54-72) 130/63 (12/02 1333) SpO2:  [90 %-100 %] 98 % (12/02 1333)  Examination General: Alert and cooperative and appears to be in no acute distress Cardio: Normal S1 and S2, RRR.  No murmurs or rubs.   Pulm: Clear to auscultation bilaterally, no crackles, wheezing, or diminished breath sounds. Normal respiratory effort Abdomen: Bowel sounds normal. Abdomen soft, tender in LIF. No guarding. No abdominal distension. Extremities: No peripheral edema.  Warm/ well perfused.  Strong radial pulse. Neuro: Cranial nerves grossly intact  Laboratory: Recent Labs  Lab 03/18/19 1200 03/19/19 0557 03/20/19 0535  WBC 14.1* 9.9 13.2*  HGB 13.6 11.8* 9.7*  HCT 38.6* 34.1* 28.9*  PLT 181 149* 138*   Recent Labs  Lab 03/18/19 1200 03/19/19 0557 03/19/19 1305 03/20/19 0535  NA 139 137  --  139  K 3.4* 3.5  --  4.3  CL 100 102  --  102  CO2 27 26  --  28  BUN 12 12  --  19  CREATININE 1.25* 1.21 1.33* 1.30*  CALCIUM 9.0 8.3*  --  8.3*  PROT 7.3 6.3*  --  6.0*  BILITOT 1.0 0.8  --  0.4  ALKPHOS 78 62  --  60  ALT 19 16  --  14  AST 25 19  --  19  GLUCOSE 326* 394*  --  316*    Imaging/Diagnostic Tests: No results found.  Lattie Haw, MD 03/20/2019, 2:35 PM PGY-1, [place] Intern pager: [phone], text pages welcome

## 2019-03-20 NOTE — Progress Notes (Signed)
Orthopaedic Trauma Service Progress Note  Patient ID: [name] MRN: [id number] DOB/AGE: September 30, 1945 73 y.o.  Subjective:  Doing ok  Pain is tolerable but he did keep him awake last night Sugars are quite elevated  Patient lives with his wife but unfortunately she is also being hospitalized at [facility] for complications after spine surgery.  She apparently had a fall while at the rehab center and then was readmitted.  She is supposedly being transferred to a new rehab center at [facility] today or tomorrow by patient's report.  From the sounds of it it sounds like patient's wife is in worse physical shape than he is.  She sustained multiple fractures over the last several years and has somewhat limited mobility.  Patient does live in a single level house with a ramp.  He does have 3 stairs to get in.  States that he has a grandson and wife's cousin that lives nearby  Patient ambulatory without assistive devices prior to his fall. States that he had near full range of motion of his left knee prior to his femur fracture   Review of Systems  Constitutional: Negative for chills and fever.  Respiratory: Negative for shortness of breath and wheezing.   Cardiovascular: Negative for chest pain and palpitations.  Gastrointestinal: Negative for abdominal pain, nausea and vomiting.    Objective:   VITALS:   Vitals:   03/19/19 2049 03/19/19 2357 03/20/19 0332 03/20/19 0741  BP: (!) 128/54 (!) 113/57 (!) 147/66 (!) 144/64  Pulse: 79 74 84 70  Resp: 18 20 18 18   Temp: 98.1 F (36.7 C) 99.3 F (37.4 C) 98 F (36.7 C) 98.3 F (36.8 C)  TempSrc: Oral Oral Oral Oral  SpO2: 97% 90% 99% 99%  Weight:      Height:        Estimated body mass index is 35.84 kg/m as calculated from the following:   Height as of this encounter: 5\' 10"  (1.778 m).   Weight as of this encounter: 113.3 kg.   Intake/Output    12/01 0701 - 12/02 0700 12/02 0701 - 12/03 0700   P.O. 480 240   I.V. (mL/kg) 1300 (11.5)    IV Piggyback 50    Total Intake(mL/kg) 1830 (16.2) 240 (2.1)   Urine (mL/kg/hr) 1575 (0.6)    Blood 100    Total Output 1675    Net +155 +240          LABS  Results for orders placed or performed during the hospital encounter of 03/18/19 (from the past 24 hour(s))  Glucose, capillary     Status: Abnormal   Collection Time: 03/19/19 11:58 AM  Result Value Ref Range   Glucose-Capillary 233 (H) 70 - 99 mg/dL   Comment 1 Notify RN    Comment 2 Document in Chart   Creatinine, serum     Status: Abnormal   Collection Time: 03/19/19  1:05 PM  Result Value Ref Range   Creatinine, Ser 1.33 (H) 0.61 - 1.24 mg/dL   GFR calc non Af Amer 53 (L) >60 mL/min   GFR calc Af Amer >60 >60 mL/min  Glucose, capillary     Status: Abnormal   Collection Time: 03/19/19  4:56 PM  Result Value Ref Range   Glucose-Capillary 369 (H) 70 - 99 mg/dL  Glucose, capillary     Status: Abnormal   Collection Time: 03/19/19  9:11 PM  Result Value Ref Range   Glucose-Capillary 401 (H) 70 - 99 mg/dL  Comprehensive metabolic panel     Status: Abnormal   Collection Time: 03/20/19  5:35 AM  Result Value Ref Range   Sodium 139 135 - 145 mmol/L   Potassium 4.3 3.5 - 5.1 mmol/L   Chloride 102 98 - 111 mmol/L   CO2 28 22 - 32 mmol/L   Glucose, Bld 316 (H) 70 - 99 mg/dL   BUN 19 8 - 23 mg/dL   Creatinine, Ser 1.30 (H) 0.61 - 1.24 mg/dL   Calcium 8.3 (L) 8.9 - 10.3 mg/dL   Total Protein 6.0 (L) 6.5 - 8.1 g/dL   Albumin 3.0 (L) 3.5 - 5.0 g/dL   AST 19 15 - 41 U/L   ALT 14 0 - 44 U/L   Alkaline Phosphatase 60 38 - 126 U/L   Total Bilirubin 0.4 0.3 - 1.2 mg/dL   GFR calc non Af Amer 55 (L) >60 mL/min   GFR calc Af Amer >60 >60 mL/min   Anion gap 9 5 - 15  CBC     Status: Abnormal   Collection Time: 03/20/19  5:35 AM  Result Value Ref Range   WBC 13.2 (H) 4.0 - 10.5 K/uL   RBC 3.00 (L) 4.22 - 5.81 MIL/uL   Hemoglobin 9.7  (L) 13.0 - 17.0 g/dL   HCT 28.9 (L) 39.0 - 52.0 %   MCV 96.3 80.0 - 100.0 fL   MCH 32.3 26.0 - 34.0 pg   MCHC 33.6 30.0 - 36.0 g/dL   RDW 13.3 11.5 - 15.5 %   Platelets 138 (L) 150 - 400 K/uL   nRBC 0.0 0.0 - 0.2 %  Glucose, capillary     Status: Abnormal   Collection Time: 03/20/19  6:33 AM  Result Value Ref Range   Glucose-Capillary 317 (H) 70 - 99 mg/dL     PHYSICAL EXAM:   Gen: Resting comfortably in bed, no acute distress, very pleasant Lungs: Unlabored, clear anterior fields Cardiac: Regular rate and rhythm, S1 and S2 Abd: Soft, nontender, + bowel sounds Ext:       Left lower extremity  Dressing is clean, dry and intact.  I removed his knee immobilizer  DPN, SPN, TN sensory functions intact  EHL, FHL, lesser toe motor functions are intact.  Ankle flexion, extension, inversion and eversion are intact  Patient can perform quad set  Tolerates passive range of motion to full extension without significant pain  Swelling is controlled distally  Extremity is warm  + DP pulse  No pain with passive stretching   Assessment/Plan: 1 Day Post-Op   Principal Problem:   Periprosthetic supracondylar fracture of femur, left Active Problems:   Coronary atherosclerosis of native coronary artery   Essential hypertension   Diabetes ([place])   Anti-infectives (From admission, onward)   Start     Dose/Rate Route Frequency Ordered Stop   03/19/19 1400  ceFAZolin (ANCEF) IVPB 1 g/50 mL premix     1 g 100 mL/hr over 30 Minutes Intravenous Every 6 hours 03/19/19 1301 03/20/19 0231   03/19/19 0745  ceFAZolin (ANCEF) IVPB 2g/100 mL premix     2 g 200 mL/hr over 30 Minutes Intravenous To [facility]Stay [facility] 03/18/19 2217 03/19/19 0835   03/19/19 0730  ceFAZolin (ANCEF) IVPB 2g/100 mL premix  Status:  Discontinued     2 g 200  mL/hr over 30 Minutes Intravenous On call to O.R. 03/18/19 1929 03/18/19 2216   03/19/19 0600  ceFAZolin (ANCEF) IVPB 2g/100 mL premix  Status:  Discontinued     2 g  200 mL/hr over 30 Minutes Intravenous On call to O.R. 03/18/19 1640 03/18/19 2216    .  POD/HD#: 42  73 year old white male S/P fall with left periprosthetic supracondylar distal femur fracture   -Fall  -Left periprosthetic supracondylar distal femur fracture S/P ORIF 03/19/2019 (Dr. Marcelino Scot)  Touchdown weightbearing left leg for 8 weeks  Unrestricted range of motion left knee immediately  Will order hinged knee brace.  Hinged knee brace will be unlocked for full range of motion   Only needs to wear brace when mobilizing.  May be out of it otherwise  Do not place pillows under the bend of the knee to prevent the development of contractures.  Place pillows under the ankle to help keep leg elevated and to keep knee in full extension when not working on range of motion exercises   Ice and elevate for swelling and pain control   PT and OT evaluations    PT- please teach HEP for left knee ROM- AROM, PROM. No ROM restrictions.  Quad sets, SLR, LAQ, SAQ, heel slides, stretching, prone flexion and extension    Ankle theraband program, heel cord stretching, toe towel curls, etc   - Pain management:  Continue with current regimen.  Usage appears to be reasonable   Schedule Tylenol 1000 mg every 8 hours   OxyIR 5 to 10 mg every 4 hours as needed for severe pain   Patient is on gabapentin chronically   If you need to add additional medications wound add low-dose Robaxin  - ABL anemia/Hemodynamics  Stable  CBC in the morning  Creatinine is a little elevated this morning compared to admission.  Slightly lower than it was yesterday afternoon   Increases LR infusion to 50 cc an hour for the next 24 hours.  Check labs in the morning  - Medical issues   Per primary team   Diabetes   Sliding scale     Tighter control.  Getting sugars less than 200 will decrease his chances of infection and other complications  - DVT/PE prophylaxis:  Lovenox 40 mg subcu daily for 28 days  - ID:    Perioperative antibiotics  - Metabolic Bone Disease:  Vitamin D levels are low normal.  I did increase his dose to 2000 IUs daily of vitamin D3.  Also started on vitamin C.  Studies have shown that vitamin D levels greater than 40 improve musculoskeletal function and decrease fall risk  - Activity:  PT and OT  Touchdown weightbearing left leg  - FEN/GI prophylaxis/Foley/Lines:  Carb mod diet  LR at 50 cc an hour  - Impediments to fracture healing:  Diabetes  Copd/asthma   - Dispo:  Therapy eval  Social work for skilled nursing facility requests  ?  CIR candidate.  We will see how he does with therapy over the next day or [place], PA-C [phone] (C) 03/20/2019, 9:28 AM  [facility] [place] [id number] [phone] Domingo Sep (F)   After 6pm on weekdays please call office number to get in touch with on call provider or refer to [place] and look to see who is on call for the Sports Medicine Call Group which is listed under orthopaedics   On Weekends please call office number  to get in touch with on call provider or refer to [facility] and look to see who is on call for the Sports Medicine Call Group which is listed under orthopaedics

## 2019-03-20 NOTE — Anesthesia Postprocedure Evaluation (Signed)
Anesthesia Post Note  Patient: [name]  Procedure(s) Performed: OPEN REDUCTION INTERNAL FIXATION (ORIF) DISTAL FEMUR FRACTURE (Left Knee)     Patient location during evaluation: PACU Anesthesia Type: General Level of consciousness: awake and alert Pain management: pain level controlled Vital Signs Assessment: post-procedure vital signs reviewed and stable Respiratory status: spontaneous breathing, nonlabored ventilation, respiratory function stable and patient connected to nasal cannula oxygen Cardiovascular status: blood pressure returned to baseline and stable Postop Assessment: no apparent nausea or vomiting Anesthetic complications: no    Last Vitals:  Vitals:   03/20/19 0332 03/20/19 0741  BP: (!) 147/66 (!) 144/64  Pulse: 84 70  Resp: 18 18  Temp: 36.7 C 36.8 C  SpO2: 99% 99%    Last Pain:  Vitals:   03/20/19 0741  TempSrc: Oral  PainSc:                  [name]

## 2019-03-20 NOTE — Evaluation (Signed)
Occupational Therapy Evaluation Patient Details Name: [name] MRN: [id number] DOB: Dec 17, 1945 Today's Date: 03/20/2019    History of Present Illness [name] is a 73 y.o. M s/p L distal femur ORIF. He has a PMH including but not limited to COPD, CAD with stent placement, HTN, gout, and diabetes type 2.    Clinical Impression   Pt with decline in function and safety with ADLs and ADL mobility with impaired balance and endurance. Pt reports that PTA, he lived at home with his wife in [place] and that he was independent with ADLs/selfcare, was driving, working and assisting his wife as needed (wife currently in hospital awaiting transfer to SNF for rehab). Pt currently requires max A with LB ADLs, mod A with toileting and min A with transfers using RW. Pt would benefit from acute OT services to address impairments to maximize level of function and safety    Follow Up Recommendations  SNF    Equipment Recommendations  Other (comment)(TBD at next level of care)    Recommendations for Other Services       Precautions / Restrictions Precautions Precautions: Fall Required Braces or Orthoses: Other Brace Restrictions Weight Bearing Restrictions: Yes LLE Weight Bearing: Touchdown weight bearing      Mobility Bed Mobility Overal bed mobility: Needs Assistance Bed Mobility: Supine to Sit     Supine to sit: Mod assist;HOB elevated     General bed mobility comments: pt in recliner upon OT arrival  Transfers Overall transfer level: Needs assistance Equipment used: Rolling walker (2 wheeled) Transfers: Sit to/from [facility] Sit to Stand: Min assist Stand pivot transfers: Min assist       General transfer comment: Pt completed 2 sit to stands, the first requiring min assist and the second requiring min guard. He completed stand pivot transfer min guard for safety.     Balance Overall balance assessment: Needs assistance Sitting-balance support:  No upper extremity supported;Feet supported Sitting balance-Leahy Scale: Good Sitting balance - Comments: maintained sitting balance EOB for donning/doffing of gait belt/gown   Standing balance support: Bilateral upper extremity supported;During functional activity Standing balance-Leahy Scale: Poor Standing balance comment: requires B UE support for standing balance                           ADL either performed or assessed with clinical judgement   ADL Overall ADL's : Needs assistance/impaired Eating/Feeding: Independent;Sitting   Grooming: Wash/dry hands;Wash/dry face;Minimal assistance;Standing   Upper Body Bathing: Set up;Sitting   Lower Body Bathing: Maximal assistance   Upper Body Dressing : Set up;Sitting   Lower Body Dressing: Maximal assistance   Toilet Transfer: Minimal assistance;Ambulation;RW;BSC;Cueing for safety   Toileting- Clothing Manipulation and Hygiene: Moderate assistance       Functional mobility during ADLs: Minimal assistance;Rolling walker;Cueing for safety       Vision Baseline Vision/History: Wears glasses Wears Glasses: Reading only Patient Visual Report: No change from baseline       Perception     Praxis      Pertinent Vitals/Pain Pain Assessment: 0-10 Pain Score: 6  Faces Pain Scale: Hurts a little bit Pain Location: L LE Pain Descriptors / Indicators: Aching;Guarding;Grimacing;Sore Pain Intervention(s): Monitored during session;Repositioned     Hand Dominance Right   Extremity/Trunk Assessment Upper Extremity Assessment Upper Extremity Assessment: Overall WFL for tasks assessed   Lower Extremity Assessment Lower Extremity Assessment: Defer to PT evaluation LLE Deficits / Details: s/p distal femur  ORIF; pain, weakness, decreased ROM   Cervical / Trunk Assessment Cervical / Trunk Assessment: Normal   Communication Communication Communication: No difficulties   Cognition Arousal/Alertness:  Awake/alert Behavior During Therapy: WFL for tasks assessed/performed Overall Cognitive Status: Within Functional Limits for tasks assessed                                     General Comments  Pt's O2 sats dropped to 87 after gait/transfer. Recovered to 95 when back in chair within 1 minute    Exercises     Shoulder Instructions      Home Living Family/patient expects to be discharged to:: Private residence Living Arrangements: Spouse/significant other Available Help at Discharge: Family Type of Home: House Home Access: [place]: One level     Bathroom Shower/Tub: Walk-in shower;Tub/shower unit   Bathroom Toilet: Standard Bathroom Accessibility: Yes   Home Equipment: None;Grab bars - tub/shower;Shower seat          Prior Functioning/Environment Level of Independence: Independent        Comments: reports that he was independent with ADLs/selfcare, was driving and working; assisted his wife as needed        OT Problem List: Impaired balance (sitting and/or standing);Pain;Decreased activity tolerance;Decreased knowledge of use of DME or AE      OT Treatment/Interventions: Self-care/ADL training;DME and/or AE instruction;Therapeutic activities;Therapeutic exercise;Patient/family education    OT Goals(Current goals can be found in the care plan section) Acute Rehab OT Goals Patient Stated Goal: to decrease pain OT Goal Formulation: With patient Time For Goal Achievement: 04/03/19 Potential to Achieve Goals: Good ADL Goals Pt Will Perform Grooming: standing;with min guard assist;with supervision;with set-up Pt Will Perform Lower Body Bathing: with mod assist;sitting/lateral leans Pt Will Transfer to Toilet: with min guard assist;with supervision;ambulating Pt Will Perform Toileting - Clothing Manipulation and hygiene: with min assist;sit to/from stand  OT Frequency: Min 2X/week   Barriers to D/C: Decreased caregiver  support          Co-evaluation              AM-PAC OT "6 Clicks" Daily Activity     Outcome Measure Help from another person eating meals?: None Help from another person taking care of personal grooming?: A Little Help from another person toileting, which includes using toliet, bedpan, or urinal?: A Lot Help from another person bathing (including washing, rinsing, drying)?: A Lot Help from another person to put on and taking off regular upper body clothing?: A Little Help from another person to put on and taking off regular lower body clothing?: A Lot 6 Click Score: 16   End of Session Equipment Utilized During Treatment: Gait belt;Rolling walker;Other (comment)(BSC)  Activity Tolerance: Patient tolerated treatment well Patient left: in chair;with call bell/phone within reach;with chair alarm set  OT Visit Diagnosis: Unsteadiness on feet (R26.81);Other abnormalities of gait and mobility (R26.89);Muscle weakness (generalized) (M62.81);Pain Pain - Right/Left: Left Pain - part of body: Leg                Time: [id number] OT Time Calculation (min): 28 min Charges:  OT General Charges $OT Visit: 1 Visit OT Treatments $Self Care/Home Management : 8-22 mins   Britt Bottom 03/20/2019, 1:59 PM

## 2019-03-20 NOTE — Progress Notes (Signed)
Per RN patient states that he refuses CPAP for the night

## 2019-03-21 LAB — BASIC METABOLIC PANEL
Anion gap: 9 (ref 5–15)
BUN: 17 mg/dL (ref 8–23)
CO2: 26 mmol/L (ref 22–32)
Calcium: 8 mg/dL — ABNORMAL LOW (ref 8.9–10.3)
Chloride: 101 mmol/L (ref 98–111)
Creatinine, Ser: 1.09 mg/dL (ref 0.61–1.24)
GFR calc Af Amer: >60 mL/min (ref >60)
GFR calc non Af Amer: >60 mL/min (ref >60)
Glucose, Bld: 258 mg/dL — ABNORMAL HIGH (ref 70–99)
Potassium: 3.8 mmol/L (ref 3.5–5.1)
Sodium: 136 mmol/L (ref 135–145)

## 2019-03-21 LAB — CBC
HCT: 25.2 % — ABNORMAL LOW (ref 39.0–52.0)
Hemoglobin: 8.9 g/dL — ABNORMAL LOW (ref 13.0–17.0)
MCH: 33.5 pg (ref 26.0–34.0)
MCHC: 35.3 g/dL (ref 30.0–36.0)
MCV: 94.7 fL (ref 80.0–100.0)
Platelets: 120 10*3/uL — ABNORMAL LOW (ref 150–400)
RBC: 2.66 MIL/uL — ABNORMAL LOW (ref 4.22–5.81)
RDW: 13.2 % (ref 11.5–15.5)
WBC: 10.2 10*3/uL (ref 4.0–10.5)
nRBC: 0 % (ref 0.0–0.2)

## 2019-03-21 LAB — GLUCOSE, CAPILLARY
Glucose-Capillary: 274 mg/dL — ABNORMAL HIGH (ref 70–99)
Glucose-Capillary: 364 mg/dL — ABNORMAL HIGH (ref 70–99)
Glucose-Capillary: 280 mg/dL — ABNORMAL HIGH (ref 70–99)
Glucose-Capillary: 398 mg/dL — ABNORMAL HIGH (ref 70–99)

## 2019-03-21 MED ORDER — METFORMIN HCL ER 500 MG PO TB24
500.0000 mg | ORAL_TABLET | Freq: Two times a day (BID) | ORAL | Status: DC
  Administered 2019-03-21 – 2019-03-23 (×4): 500 mg via ORAL
  Filled 2019-03-21 (×6): qty 1

## 2019-03-21 MED ORDER — INSULIN GLARGINE 100 UNIT/ML ~~LOC~~ SOLN
10.0000 [IU] | Freq: Every day | SUBCUTANEOUS | Status: DC
  Administered 2019-03-21: 10 [IU] via SUBCUTANEOUS
  Filled 2019-03-21 (×2): qty 0.1

## 2019-03-21 MED ORDER — ACARBOSE 25 MG PO TABS
100.0000 mg | ORAL_TABLET | Freq: Three times a day (TID) | ORAL | Status: DC
  Administered 2019-03-22 – 2019-03-23 (×5): 100 mg via ORAL
  Filled 2019-03-21 (×3): qty 4
  Filled 2019-03-21: qty 2
  Filled 2019-03-21 (×2): qty 4
  Filled 2019-03-21: qty 1
  Filled 2019-03-21 (×2): qty 4
  Filled 2019-03-21: qty 1
  Filled 2019-03-21: qty 4
  Filled 2019-03-21: qty 2

## 2019-03-21 MED ORDER — TORSEMIDE 20 MG PO TABS
20.0000 mg | ORAL_TABLET | Freq: Every day | ORAL | Status: DC
  Administered 2019-03-21 – 2019-03-22 (×2): 20 mg via ORAL
  Filled 2019-03-21 (×2): qty 1

## 2019-03-21 MED ORDER — POLYETHYLENE GLYCOL 3350 17 G PO PACK
17.0000 g | PACK | Freq: Two times a day (BID) | ORAL | Status: DC
  Administered 2019-03-21 – 2019-03-22 (×3): 17 g via ORAL
  Filled 2019-03-21 (×3): qty 1

## 2019-03-21 NOTE — Progress Notes (Signed)
Inpatient Diabetes Program Recommendations  AACE/ADA: New Consensus Statement on Inpatient Glycemic Control (2015)  Target Ranges:  Prepandial:   less than 140 mg/dL      Peak postprandial:   less than 180 mg/dL (1-2 hours)      Critically ill patients:  140 - 180 mg/dL   Lab Results  Component Value Date   GLUCAP 274 (H) 03/21/2019   HGBA1C 8.8 (H) 03/18/2019    Review of Glycemic Control Results for DRAGO, MCKENDRICK (MRN [id number]) as of 03/21/2019 10:08  Ref. Range 03/20/2019 16:35 03/20/2019 20:30 03/21/2019 06:36  Glucose-Capillary Latest Ref Range: 70 - 99 mg/dL 351 (H) 311 (H) 274 (H)   Diabetes history:DM2 Outpatient Diabetes medications:Acarbose 100 mg TID with meals, Victoza 1.8 mg QAM, Metformin 500 mg BID Current orders for Inpatient glycemic control:Lantus 15 units daily, Novolog 0-15 units TID with meals  Inpatient Diabetes Program Recommendations:  Basal: Consider increasing Lantus to 20 units QD.  Correction (SSI): Consider adding Novolog 0-5 units QHS for bedtime correction.  Insulin - Meal Coverage:Consider ordering Novolog 3 units TID with meals for meal coverage if patient eats at least 50% of meals.  Diet: Please discontinue Regular diet and order Carb Modified diet.  NOTE: Noted patient received Decadron 4 mg at 9am on 03/19/19 which is contributing to hyperglycemia.  Thanks, Bronson Curb, MSN, RNC-OB Diabetes Coordinator [phone] (8a-5p)

## 2019-03-21 NOTE — Progress Notes (Signed)
   03/21/19 1455  SNF Authorization Status  SNF Authorization Type Transition of Care CM/SW Authorization Request  SNF Auth Started 03/21/19  SNF Auth Start Time 1455  SNF Authorization Status Started   Insurance auth initiated for ST SNF placement.   Midge Minium MSN, RN, NCM-BC, ACM-RN [phone]

## 2019-03-21 NOTE — Progress Notes (Signed)
Orthopaedic Trauma Service (OTS)  2 Days Post-Op Procedure(s) (LRB): OPEN REDUCTION INTERNAL FIXATION (ORIF) DISTAL FEMUR FRACTURE (Left)  Subjective: Patient reports pain as mild.  Pain 6/ 10 at worst. Eager to get some sleep!  Objective: Current Vitals Blood pressure 133/69, pulse 82, temperature 98.3 F (36.8 C), temperature source Oral, resp. rate 18, height 5\' 10"  (1.778 m), weight 113.3 kg, SpO2 96 %. Vital signs in last 24 hours: Temp:  [98.3 F (36.8 C)-98.9 F (37.2 C)] 98.3 F (36.8 C) (12/03 0805) Pulse Rate:  [69-82] 82 (12/03 0805) Resp:  [17-19] 18 (12/03 0805) BP: (130-146)/(63-78) 133/69 (12/03 0805) SpO2:  [96 %-98 %] 96 % (12/03 0806)  Intake/Output from previous day: 12/02 0701 - 12/03 0700 In: 960 [P.O.:960] Out: 3300 [Urine:3300]  LABS Recent Labs    03/19/19 0557 03/20/19 0535 03/21/19 0259  HGB 11.8* 9.7* 8.9*   Recent Labs    03/20/19 0535 03/21/19 0259  WBC 13.2* 10.2  RBC 3.00* 2.66*  HCT 28.9* 25.2*  PLT 138* 120*   Recent Labs    03/20/19 0535 03/21/19 0259  NA 139 136  K 4.3 3.8  CL 102 101  CO2 28 26  BUN 19 17  CREATININE 1.30* 1.09  GLUCOSE 316* 258*  CALCIUM 8.3* 8.0*   Recent Labs    03/19/19 0557  INR 1.1     Physical Exam LLE Dressing intact, clean, dry  Edema/ swelling controlled  Sens: DPN, SPN, TN intact  Motor: EHL, FHL, and lessor toe ext and flex all intact grossly  Brisk cap refill, warm to touch  Assessment/Plan: 2 Days Post-Op Procedure(s) (LRB): OPEN REDUCTION INTERNAL FIXATION (ORIF) DISTAL FEMUR FRACTURE (Left) 1. PT/OT TDWB 2. DVT proph Lovenox 3. F/u 8-14 days post d/c to SNF  Altamese Wiley Ford, MD [facility] [phone] (p)

## 2019-03-21 NOTE — Progress Notes (Signed)
SATURATION QUALIFICATIONS: (This note is used to comply with regulatory documentation for home oxygen)  Patient Saturations on Room Air at Rest = 92%  Patient Saturations on Room Air while Ambulating = 82%  Patient Saturations on 4 Liters of oxygen while Ambulating = 93%  Please briefly explain why patient needs home oxygen: Pt's O2 sats dropped to 82% while ambulating and he required 4L of oxygen to stay above 90% during gait. He also dropped to 80% while moving from supine to sitting at EOB.   Silvana Newness, SPT

## 2019-03-21 NOTE — Progress Notes (Signed)
Family Medicine Teaching Service Daily Progress Note Intern Pager: [phone]  Patient name: [name] Medical record number: [id number] Date of birth: 08-26-1945 Age: 73 y.o. Gender: male  Primary Care Provider: Jeanie Sewer, DO Consultants: Ortho  Code Status: FULL Pt Overview and Major Events to Date:  11/30-admitted  12/01-Periprosthetic femur fracture revision  Assessment and Plan:  Periprosthetic femur fracture 2/2 mechanical fall-treating Pain adequately controlled postop with oxycodone.  Left leg in boot.  Normal movement of toes Day 2 postop ORIF for left distal femur fracture -Orthopedic sugery following, appreciate recommendations  -PT/OT: eval and treat after surgery and have weight bearing status -Vitals per routine  -Tylenol 650mg  Q6H PRN -Oxycodone IR 5 mg Q6H PRN for severe pain -Zofran every Q5H -CBC am to monitor Hb post op  -Continue heparin prophylaxis  HFpEF-chronic, stable Last echo 2017: LV EF 55-60% Denies SOB/chest pain  In: 960 mL Urine output 3.3 L Weight on 12/1 113.3 kgs, weight on admission 114.3 kgs  -Judicious use of IV fluids -Restart Torsemide 20 mg once daily, can increase tomorrow to BID   AKI-improving Cr 1.09 today, Cr 1.3 on 12/2,baseline ~0.9-1.0 -Avoid nephrotoxic agents -BMP a.m.  HTN-chronic, stable BP Q000111Q to 0000000 systolic Home meds include Coreg 25 mg twice daily, Imdur 30 mg daily, amlodipine 10 mg and  torsemide 40 mg daily. Not taking lisinopril as developed a cough. -Continue to monitor BP post op -Continue home Coreg, Imdur, restart torsemide  Dysuria-resolved -UA negative  Hypokalemia-resolved K 3.8 today, K 0.3 on 12/2 -AM BMP  CAD w/ stents  Hx of MI-stable  Home meds: 81 mg aspirin daily, Lipitor 80 mg daily.  Cardiology cleared patient for surgery today. -Continue home meds  T2DM-chronic, treating CBG 311, 351, 384 over last 24 hours  Home meds: Victoza 1.8 mg in the morning, Metformin  500 mg BID and acarbose 100 mg TID.   -Restart home medications and Lantus 15 units -Monitor CBGs with meals and bedtime -discontinue sensitive Sliding scale insulin  COPD-chronic, stable -Denies shortness of breath or wheeze Prior CPAP at night.  Follows with Dr. Arlis Porta. Home medications Symbicort and Albuterol inhaler.Has not needed rescue inhaler recently. Ruthe Mannan daily -Incentive spirometry   GERD-chronic, stable Home meds:  Protonix 40 mg daily -Continue Protinix   Gout-chronic, stable  Home meds: allopurinol 300 mg am -Continue allopurinol   Hypercholesterolemia-chronic, stable Home meds: 80 mg atorvastatin -Continue statin  Peripheral neuropathy-chronic, stable Home medications include gabapentin 600 mg at bedtime. - Continue Gabapentin   Allergic rhinitis-chronic, stable Home medications include Atrovent, Xyzal 5 mg at bedtime, Singulair 10 mg daily, -Continue Singulair  Insomnia-neck, stable Home medications include 10 mg Ambien at bedtime. -Continue home medication  FEN/GI: normal diet  Prophylaxis: Heparin  Disposition: home pending PT/OT  Subjective:  Pain adequately controlled postop with oxycodone. No new concerns.  Objective: Temp:  [98.3 F (36.8 C)-99.5 F (37.5 C)] 99.5 F (37.5 C) (12/03 1348) Pulse Rate:  [69-82] 74 (12/03 1348) Resp:  [17-19] 18 (12/03 1348) BP: (133-146)/(60-78) 138/60 (12/03 1348) SpO2:  [96 %-99 %] 99 % (12/03 1348)  General: Alert, pleasant, cooperative and appears to be in no acute distress Cardio: Normal S1 and S2, RRR, No murmurs or rubs.   Pulm: CTAB no crackles, wheezing, or diminished breath sounds. Normal respiratory effort Abdomen: Bowel sounds normal. Abdomen soft and non-tender.  Extremities: No peripheral edema. Warm/ well perfused.  Strong radial pulse Neuro: Cranial nerves grossly intact  Laboratory: Recent Labs  Lab 03/19/19 0557 03/20/19 0535 03/21/19 0259  WBC 9.9 13.2* 10.2  HGB 11.8*  9.7* 8.9*  HCT 34.1* 28.9* 25.2*  PLT 149* 138* 120*   Recent Labs  Lab 03/18/19 1200 03/19/19 0557 03/19/19 1305 03/20/19 0535 03/21/19 0259  NA 139 137  --  139 136  K 3.4* 3.5  --  4.3 3.8  CL 100 102  --  102 101  CO2 27 26  --  28 26  BUN 12 12  --  19 17  CREATININE 1.25* 1.21 1.33* 1.30* 1.09  CALCIUM 9.0 8.3*  --  8.3* 8.0*  PROT 7.3 6.3*  --  6.0*  --   BILITOT 1.0 0.8  --  0.4  --   ALKPHOS 78 62  --  60  --   ALT 19 16  --  14  --   AST 25 19  --  19  --   GLUCOSE 326* 394*  --  316* 258*    Imaging/Diagnostic Tests: No results found.  Lattie Haw, MD 03/21/2019, 2:16 PM PGY-1, [place] Intern pager: [phone], text pages welcome

## 2019-03-21 NOTE — Progress Notes (Signed)
Physical Therapy Treatment Patient Details Name: [name] MRN: [id number] DOB: May 02, 1945 Today's Date: 03/21/2019    History of Present Illness [name] is a 73 y.o. M s/p L distal femur ORIF. He has a PMH including but not limited to COPD, CAD with stent placement, HTN, gout, and diabetes type 2.     PT Comments    Pt admitted for above diagnosis. He was able to ambulate 25 ft with min guard while maintaining TDWB status. He requires some cueing for hand placement on walker during transfers. As described below, pt's O2 sat on room air dropped to 80% upon reaching EOB from supine. During ambulation on room air, his sats dropped to 82%. He required 4L of O2, after which his O2 sat reached 93%. Reviewed LE exercises emphasizing knee ROM including quad sets, heel slides, and long arc quad. Pt tolerated these well. Pt would benefit from continued skilled PT in order to address deficits.   Follow Up Recommendations  SNF;Supervision for mobility/OOB     Equipment Recommendations  Rolling walker with 5" wheels    Recommendations for Other Services       Precautions / Restrictions Precautions Precautions: Fall Required Braces or Orthoses: Other Brace Other Brace: hinged knee brace- on during mobility  Restrictions Weight Bearing Restrictions: Yes LLE Weight Bearing: Touchdown weight bearing    Mobility  Bed Mobility Overal bed mobility: Needs Assistance Bed Mobility: Supine to Sit     Supine to sit: Min guard;HOB elevated     General bed mobility comments: pt required increased time and effort but was able to bring B LE off bed without physical assistance; his O2 sats dropped to 80% upon reaching EOB   Transfers Overall transfer level: Needs assistance Equipment used: Rolling walker (2 wheeled) Transfers: Sit to/from Stand Sit to Stand: Min assist         General transfer comment: pt required increased time and effort to stand as well as min assist. He did  well maintaining TDWB status during transfers  Ambulation/Gait Ambulation/Gait assistance: Min guard Gait Distance (Feet): 25 Feet Assistive device: Rolling walker (2 wheeled) Gait Pattern/deviations: Antalgic;Step-to pattern;Decreased stride length Gait velocity: decreased   General Gait Details: pt ambulated 25 ft with min guard while maintaining TDWB on L LE with minimal cueing. He required 4L of O2 to keep O2 sats above 90% during gait   Stairs             Wheelchair Mobility    Modified Rankin (Stroke Patients Only)       Balance Overall balance assessment: Needs assistance Sitting-balance support: No upper extremity supported;Feet supported Sitting balance-Leahy Scale: Good Sitting balance - Comments: maintained sitting balance EOB for donning/doffing of gait belt/gown   Standing balance support: Bilateral upper extremity supported;During functional activity Standing balance-Leahy Scale: Poor Standing balance comment: requires B UE support for standing balance                            Cognition Arousal/Alertness: Awake/alert Behavior During Therapy: WFL for tasks assessed/performed Overall Cognitive Status: Within Functional Limits for tasks assessed                                        Exercises General Exercises - Lower Extremity Ankle Circles/Pumps: AROM;Both;10 reps;Supine Quad Sets: AROM;Left;10 reps;Supine Long Arc Quad: AROM;Left;5 reps;Seated Heel Slides:  AROM;Left;10 reps;Supine    General Comments General comments (skin integrity, edema, etc.): Pt's O2 sats dropped briefly to 80% when moving supine to sit. His ambulatory O2 sats dropped to 82 with room air      Pertinent Vitals/Pain Pain Assessment: 0-10 Pain Score: 8  Pain Location: L LE Pain Descriptors / Indicators: Aching;Guarding;Grimacing;Sore Pain Intervention(s): Limited activity within patient's tolerance;Monitored during session;Repositioned     Home Living                      Prior Function            PT Goals (current goals can now be found in the care plan section) Acute Rehab PT Goals Patient Stated Goal: to decrease pain PT Goal Formulation: With patient Time For Goal Achievement: 04/03/19 Potential to Achieve Goals: Good Progress towards PT goals: Progressing toward goals    Frequency    Min 3X/week      PT Plan Current plan remains appropriate    Co-evaluation              AM-PAC PT "6 Clicks" Mobility   Outcome Measure  Help needed turning from your back to your side while in a flat bed without using bedrails?: A Little Help needed moving from lying on your back to sitting on the side of a flat bed without using bedrails?: A Little Help needed moving to and from a bed to a chair (including a wheelchair)?: A Little Help needed standing up from a chair using your arms (e.g., wheelchair or bedside chair)?: A Little Help needed to walk in hospital room?: A Little Help needed climbing 3-5 steps with a railing? : Total 6 Click Score: 16    End of Session Equipment Utilized During Treatment: Gait belt Activity Tolerance: Patient tolerated treatment well;Patient limited by fatigue Patient left: in chair;with call bell/phone within reach;with chair alarm set Nurse Communication: Mobility status PT Visit Diagnosis: Unsteadiness on feet (R26.81);Other abnormalities of gait and mobility (R26.89);Muscle weakness (generalized) (M62.81);Pain Pain - Right/Left: Left Pain - part of body: Leg     Time: 1041-1103 PT Time Calculation (min) (ACUTE ONLY): 22 min  Charges:  $Gait Training: 8-22 mins                     Silvana Newness, SPT   Toy Eisemann 03/21/2019, 1:14 PM

## 2019-03-21 NOTE — NC FL2 (Signed)
[place] LEVEL OF CARE SCREENING TOOL     IDENTIFICATION  Patient Name: [name] Birthdate: 01-Apr-1946 Sex: male Admission Date (Current Location): 03/18/2019  [facility] and [place] Number:  Manufacturing engineer and Address:  [facility] [place] [address] [place], [place] [address]      Provider Number: [id number]  Attending Physician Name and Address:  Lind Covert, MD  Relative Name and Phone Number:  Askia Zenk [phone]    Current Level of Care: Hospital Recommended Level of Care: [place] Prior Approval Number:    Date Approved/Denied:   PASRR Number: [id number]  Discharge Plan: SNF    Current Diagnoses: Patient Active Problem List   Diagnosis Date Noted  . Diabetes ([place]) 03/20/2019  . Periprosthetic supracondylar fracture of femur, left 03/18/2019  . Pure hypercholesterolemia   . Essential hypertension   . Coronary atherosclerosis of native coronary artery 04/22/2001    Orientation RESPIRATION BLADDER Height & Weight     Self, Time, Situation, Place  O2(2LPM) Continent Weight: 113.3 kg Height:  5\' 10"  (177.8 cm)  BEHAVIORAL SYMPTOMS/MOOD NEUROLOGICAL BOWEL NUTRITION STATUS  Other (Comment)(N/A) (N/A) Continent Diet(see discharge summary)  AMBULATORY STATUS COMMUNICATION OF NEEDS Skin   Limited Assist Verbally Surgical wounds(Left leg)                       Personal Care Assistance Level of Assistance  Bathing, Feeding, Dressing, Total care Bathing Assistance: Maximum assistance Feeding assistance: Limited assistance Dressing Assistance: Maximum assistance Total Care Assistance: Limited assistance   Functional Limitations Info  Sight, Speech, Hearing Sight Info: Adequate Hearing Info: Adequate Speech Info: Adequate    SPECIAL CARE FACTORS FREQUENCY  PT (By licensed PT), OT (By licensed OT)     PT Frequency: Min 3X/week OT Frequency: Min 2X/week             Contractures Contractures Info: Not present    Additional Factors Info  Code Status, Allergies, Insulin Sliding Scale Code Status Info: Full Code Allergies Info: Lisinopril   Insulin Sliding Scale Info: 3 times daily with meals       Current Medications (03/21/2019):  This is the current hospital active medication list Current Facility-Administered Medications  Medication Dose Route Frequency Provider Last Rate Last Dose  . allopurinol (ZYLOPRIM) tablet 300 mg  300 mg Oral q morning - 10a Ainsley Spinner, PA-C   300 mg at 03/21/19 [id number]  . aspirin chewable tablet 81 mg  81 mg Oral Daily Ainsley Spinner, PA-C   81 mg at 03/21/19 [id number]  . atorvastatin (LIPITOR) tablet 80 mg  80 mg Oral QHS Ainsley Spinner, PA-C   80 mg at 03/20/19 2123  . carvedilol (COREG) tablet 25 mg  25 mg Oral BID WC Ainsley Spinner, PA-C   25 mg at 03/21/19 0834  . Chlorhexidine Gluconate Cloth 2 % PADS 6 each  6 each Topical Daily Ainsley Spinner, PA-C   6 each at 03/21/19 [id number]  . cholecalciferol (VITAMIN D3) tablet 2,000 Units  2,000 Units Oral Daily Ainsley Spinner, PA-C   2,000 Units at 03/21/19 [id number]  . docusate sodium (COLACE) capsule 100 mg  100 mg Oral BID Ainsley Spinner, PA-C   100 mg at 03/21/19 [id number]  . enoxaparin (LOVENOX) injection 40 mg  40 mg Subcutaneous Q24H Ainsley Spinner, PA-C   40 mg at 03/21/19 [id number]  . gabapentin (NEURONTIN) capsule 600 mg  600 mg Oral QHS Ainsley Spinner, PA-C  600 mg at 03/20/19 2123  . insulin aspart (novoLOG) injection 0-15 Units  0-15 Units Subcutaneous TID WC Autry-Lott, Simone, DO   8 Units at 03/21/19 [id number]  . insulin glargine (LANTUS) injection 15 Units  15 Units Subcutaneous Daily Autry-Lott, Simone, DO   15 Units at 03/20/19 1648  . isosorbide mononitrate (IMDUR) 24 hr tablet 30 mg  30 mg Oral q morning - 10a Ainsley Spinner, PA-C   30 mg at 03/21/19 [id number]  . metoCLOPramide (REGLAN) tablet 5-10 mg  5-10 mg Oral Q8H PRN Ainsley Spinner, PA-C       Or  . metoCLOPramide (REGLAN) injection 5-10 mg  5-10 mg  Intravenous Q8H PRN Ainsley Spinner, PA-C      . mometasone-formoterol [facility]) 200-5 MCG/ACT inhaler 2 puff  2 puff Inhalation BID Ainsley Spinner, PA-C   2 puff at 03/21/19 [id number]  . montelukast (SINGULAIR) tablet 10 mg  10 mg Oral q morning - 10a Ainsley Spinner, PA-C   10 mg at 03/21/19 [id number]  . mupirocin ointment (BACTROBAN) 2 % 1 application  1 application Nasal BID Ainsley Spinner, PA-C   1 application at 0000000 2125  . ondansetron (ZOFRAN) tablet 4 mg  4 mg Oral Q6H PRN Ainsley Spinner, PA-C       Or  . ondansetron [facility]) injection 4 mg  4 mg Intravenous Q6H PRN Ainsley Spinner, PA-C      . oxyCODONE (Oxy IR/ROXICODONE) immediate release tablet 5-10 mg  5-10 mg Oral Q4H PRN Ainsley Spinner, PA-C   10 mg at 03/21/19 0847  . pantoprazole (PROTONIX) EC tablet 40 mg  40 mg Oral q morning - 10a Ainsley Spinner, PA-C   40 mg at 03/21/19 [id number]  . polyethylene glycol (MIRALAX / GLYCOLAX) packet 17 g  17 g Oral Daily Autry-Lott, Simone, DO      . vitamin C (ASCORBIC ACID) tablet 1,000 mg  1,000 mg Oral Daily Ainsley Spinner, PA-C   1,000 mg at 03/21/19 [id number]     Discharge Medications: Please see discharge summary for a list of discharge medications.  Relevant Imaging Results:  Relevant Lab Results:   Additional Information SSN: [id number]; CPAP at night  Midge Minium MSN, [place], NCM-BC, ACM-RN [phone]

## 2019-03-21 NOTE — TOC Initial Note (Signed)
Transition of Care [facility]) - Initial/Assessment Note    Patient Details  Name: [name] MRN: [id number] Date of Birth: 04/18/1946  [facility]) CM/SW Contact:    Midge Minium MSN, RN, NCM-BC, ACM-RN [phone] Phone Number: 03/21/2019, 3:00 PM  Clinical Narrative:                 CM following for dispositional needs. CM spoke to the patient to discuss the POC. The patient lived at home PTA, his spouse is currently at [facility] for rehab. Patient is a 73 y.o. M s/p L distal femur ORIF. He has a PMH including but not limited to COPD, CAD with stent placement, HTN, gout, and diabetes type 2. PT/OT eval completed with ST SNF recommended with the patient agreeable. CMS SNF Compare list provided with [facility] [place] the patients preference so he will be closer to his spouse. FL2/PASSR completed; insurance auth initiated. SNF referral discussed with Mardene Celeste [facility] liaison); pending bed availability and auth determination. CM team will continue to follow.   Expected Discharge Plan: Skilled Nursing Facility Barriers to Discharge: Continued Medical Work up, SNF Pending bed offer, Insurance Authorization   Patient Goals and CMS Choice Patient states their goals for this hospitalization and ongoing recovery are:: "to get better" CMS Medicare.gov Compare Post Acute Care list provided to:: Patient Choice offered to / list presented to : Patient  Expected Discharge Plan and Services Expected Discharge Plan: [place] In-house Referral: NA Discharge Planning Services: CM Consult Post Acute Care Choice: [place] Living arrangements for the past 2 months: Single Family Home                 DME Arranged: N/A DME Agency: NA       HH Arranged: NA HH Agency: NA        Prior Living Arrangements/Services Living arrangements for the past 2 months: Single Family Home Lives with:: Self, Spouse Patient language and need  for interpreter reviewed:: Yes Do you feel safe going back to the place where you live?: Yes      Need for Family Participation in Patient Care: Yes (Comment) Care giver support system in place?: No (comment)(Spouse is currently in [place])   Criminal Activity/Legal Involvement Pertinent to Current Situation/Hospitalization: No - Comment as needed  Activities of Daily Living Home Assistive Devices/Equipment: None ADL Screening (condition at time of admission) Patient's cognitive ability adequate to safely complete daily activities?: Yes Is the patient deaf or have difficulty hearing?: No Does the patient have difficulty seeing, even when wearing glasses/contacts?: No Does the patient have difficulty concentrating, remembering, or making decisions?: No Patient able to express need for assistance with ADLs?: Yes Does the patient have difficulty dressing or bathing?: No Independently performs ADLs?: Yes (appropriate for developmental age) Does the patient have difficulty walking or climbing stairs?: Yes Weakness of Legs: None Weakness of Arms/Hands: None  Permission Sought/Granted Permission sought to share information with : Case Manager, Chartered certified accountant granted to share information with : Yes, Verbal Permission Granted     Permission granted to share info w AGENCY: [facility]        Emotional Assessment   Attitude/Demeanor/Rapport: Gracious Affect (typically observed): Pleasant Orientation: : Oriented to Self, Oriented to Place, Oriented to  Time, Oriented to Situation Alcohol / Substance Use: Not Applicable Psych Involvement: No (comment)  Admission diagnosis:  Periprosthetic fracture of knee [id number]] Patient Active  Problem List   Diagnosis Date Noted  . Diabetes ([place]) 03/20/2019  . Periprosthetic supracondylar fracture of femur, left 03/18/2019  . Pure hypercholesterolemia   . Essential hypertension   . Coronary  atherosclerosis of native coronary artery 04/22/2001   PCP:  Jeanie Sewer, DO Pharmacy:   [facility] [place] [address] [place] [address] Phone: [phone] Fax: [place] [address] [place] [address] Phone: [phone] Fax: [phone]     Social Determinants of Health (SDOH) Interventions    Readmission Risk Interventions No flowsheet data found.

## 2019-03-21 NOTE — Progress Notes (Signed)
Pt's friend will bring Victoza pen tomorrow.  Lattie Haw PGY1, [place]

## 2019-03-22 DIAGNOSIS — Z96659 Presence of unspecified artificial knee joint: Secondary | ICD-10-CM

## 2019-03-22 DIAGNOSIS — M978XXA Periprosthetic fracture around other internal prosthetic joint, initial encounter: Secondary | ICD-10-CM

## 2019-03-22 LAB — COMPREHENSIVE METABOLIC PANEL
ALT: 13 U/L (ref 0–44)
AST: 16 U/L (ref 15–41)
Albumin: 2.9 g/dL — ABNORMAL LOW (ref 3.5–5.0)
Alkaline Phosphatase: 65 U/L (ref 38–126)
Anion gap: 10 (ref 5–15)
BUN: 20 mg/dL (ref 8–23)
CO2: 28 mmol/L (ref 22–32)
Calcium: 8.4 mg/dL — ABNORMAL LOW (ref 8.9–10.3)
Chloride: 98 mmol/L (ref 98–111)
Creatinine, Ser: 1.11 mg/dL (ref 0.61–1.24)
GFR calc Af Amer: >60 mL/min (ref >60)
GFR calc non Af Amer: >60 mL/min (ref >60)
Glucose, Bld: 320 mg/dL — ABNORMAL HIGH (ref 70–99)
Potassium: 3.4 mmol/L — ABNORMAL LOW (ref 3.5–5.1)
Sodium: 136 mmol/L (ref 135–145)
Total Bilirubin: 0.8 mg/dL (ref 0.3–1.2)
Total Protein: 5.9 g/dL — ABNORMAL LOW (ref 6.5–8.1)

## 2019-03-22 LAB — CBC
HCT: 25.1 % — ABNORMAL LOW (ref 39.0–52.0)
Hemoglobin: 8.6 g/dL — ABNORMAL LOW (ref 13.0–17.0)
MCH: 32.6 pg (ref 26.0–34.0)
MCHC: 34.3 g/dL (ref 30.0–36.0)
MCV: 95.1 fL (ref 80.0–100.0)
Platelets: 144 10*3/uL — ABNORMAL LOW (ref 150–400)
RBC: 2.64 MIL/uL — ABNORMAL LOW (ref 4.22–5.81)
RDW: 13.2 % (ref 11.5–15.5)
WBC: 9 10*3/uL (ref 4.0–10.5)
nRBC: 0 % (ref 0.0–0.2)

## 2019-03-22 LAB — GLUCOSE, CAPILLARY
Glucose-Capillary: 302 mg/dL — ABNORMAL HIGH (ref 70–99)
Glucose-Capillary: 459 mg/dL — ABNORMAL HIGH (ref 70–99)
Glucose-Capillary: 386 mg/dL — ABNORMAL HIGH (ref 70–99)
Glucose-Capillary: 323 mg/dL — ABNORMAL HIGH (ref 70–99)

## 2019-03-22 MED ORDER — ASCORBIC ACID 1000 MG PO TABS: 1000.0000 mg | ORAL_TABLET | Freq: Every day | ORAL | 0 refills | Status: DC

## 2019-03-22 MED ORDER — INSULIN GLARGINE 100 UNIT/ML ~~LOC~~ SOLN
20.0000 [IU] | Freq: Every day | SUBCUTANEOUS | Status: DC
  Administered 2019-03-22: 20 [IU] via SUBCUTANEOUS
  Filled 2019-03-22 (×2): qty 0.2

## 2019-03-22 MED ORDER — VITAMIN D3 25 MCG PO TABS: 2000.0000 [IU] | ORAL_TABLET | Freq: Every day | ORAL | 2 refills | Status: DC

## 2019-03-22 MED ORDER — OXYCODONE-ACETAMINOPHEN 5-325 MG PO TABS: 1.0000 | ORAL_TABLET | Freq: Four times a day (QID) | ORAL | 0 refills | Status: DC | PRN

## 2019-03-22 MED ORDER — TORSEMIDE 20 MG PO TABS
20.0000 mg | ORAL_TABLET | Freq: Two times a day (BID) | ORAL | Status: DC
  Administered 2019-03-22 – 2019-03-23 (×2): 20 mg via ORAL
  Filled 2019-03-22 (×3): qty 1

## 2019-03-22 MED ORDER — ACETAMINOPHEN 500 MG PO TABS
1000.0000 mg | ORAL_TABLET | Freq: Three times a day (TID) | ORAL | Status: DC
  Administered 2019-03-22 – 2019-03-23 (×4): 1000 mg via ORAL
  Filled 2019-03-22 (×4): qty 2

## 2019-03-22 MED ORDER — INSULIN ASPART 100 UNIT/ML ~~LOC~~ SOLN
2.0000 [IU] | Freq: Once | SUBCUTANEOUS | Status: AC
  Administered 2019-03-22: 2 [IU] via SUBCUTANEOUS

## 2019-03-22 MED ORDER — AMLODIPINE BESYLATE 10 MG PO TABS
10.0000 mg | ORAL_TABLET | Freq: Every day | ORAL | Status: DC
  Administered 2019-03-22 – 2019-03-23 (×2): 10 mg via ORAL
  Filled 2019-03-22 (×2): qty 1

## 2019-03-22 MED ORDER — POTASSIUM CHLORIDE CRYS ER 20 MEQ PO TBCR
40.0000 meq | EXTENDED_RELEASE_TABLET | Freq: Once | ORAL | Status: AC
  Administered 2019-03-22: 40 meq via ORAL
  Filled 2019-03-22: qty 2

## 2019-03-22 MED ORDER — INSULIN ASPART 100 UNIT/ML ~~LOC~~ SOLN
3.0000 [IU] | Freq: Three times a day (TID) | SUBCUTANEOUS | Status: DC
  Administered 2019-03-22 – 2019-03-23 (×3): 3 [IU] via SUBCUTANEOUS

## 2019-03-22 MED ORDER — INSULIN ASPART 100 UNIT/ML ~~LOC~~ SOLN
0.0000 [IU] | Freq: Every day | SUBCUTANEOUS | Status: DC
  Administered 2019-03-22: 4 [IU] via SUBCUTANEOUS

## 2019-03-22 MED ORDER — ENOXAPARIN SODIUM 40 MG/0.4ML ~~LOC~~ SOLN: 40.0000 mg | SUBCUTANEOUS | 0 refills | Status: DC

## 2019-03-22 MED ORDER — INSULIN ASPART 100 UNIT/ML ~~LOC~~ SOLN
0.0000 [IU] | Freq: Three times a day (TID) | SUBCUTANEOUS | Status: DC
  Administered 2019-03-22: 15 [IU] via SUBCUTANEOUS
  Administered 2019-03-23: 11 [IU] via SUBCUTANEOUS
  Administered 2019-03-23: 8 [IU] via SUBCUTANEOUS

## 2019-03-22 NOTE — Progress Notes (Addendum)
   03/22/19 1043  TOC Discharge Assessment  Patient chooses bed at Other - please specify in the comment section below: [facility] [place])  Patient to be transferred to facility by [facility]  Name of family member notified patient   CM spoke to Mardene Celeste [facility] [employer]); the facility has a bed available on 03/23/19; Discharge Summary needed by 10:00AM and an updated COVID test. CM has updated the patient who is agreeable to the plan, but doesn't want to remain at the SNF >14 days. CM discussed the facility process; the Admissions Coordinator will contact the patient to discuss the facility and therapy process as well  Midge Minium MSN, RN, NCM-BC, ACM-RN [phone]

## 2019-03-22 NOTE — Discharge Instructions (Addendum)
Orthopaedic Trauma Service Discharge Instructions   General Discharge Instructions  Orthopaedic Injuries:  Left periprosthetic distal femur fracture: treated with open reduction and internal fixation using plate and screws   WEIGHT BEARING STATUS: Touchdown weightbearing left leg   RANGE OF MOTION/ACTIVITY: unrestricted knee range of motion. Only need to wear hinged knee brace when mobilizing. Ambulate with walker. Activity as tolerated while maintaining weightbearing restrictions. Do not place pillow under the back of your knee when at rest. Place under ankle to elevate leg and to force knee straight   Bone health:  Your vitamin d levels are low normal. Your vitamin d dosage has been increased.  Continue to keep tight control of your diabetes. Uncontrolled diabetes will make your bone weaker and increase your chances of sustaining another fracture and not healing your current fracture   Wound Care: daily wound care starting on 03/25/2019. See below.   Plan for suture removal around 04/01/2019 Discharge Wound Care Instructions  Do NOT apply any ointments, solutions or lotions to pin sites or surgical wounds.  These prevent needed drainage and even though solutions like hydrogen peroxide kill bacteria, they also damage cells lining the pin sites that help fight infection.  Applying lotions or ointments can keep the wounds moist and can cause them to breakdown and open up as well. This can increase the risk for infection. When in doubt call the office.  Surgical incisions should be dressed daily.  If any drainage is noted, use one layer of adaptic, then gauze, Kerlix, and an ace wrap.  Once the incision is completely dry and without drainage, it may be left open to air out.  Showering may begin 36-48 hours later.  Cleaning gently with soap and water.  Traumatic wounds should be dressed daily as well.    One layer of adaptic, gauze, Kerlix, then ace wrap.  The adaptic can be discontinued  once the draining has ceased    If you have a wet to dry dressing: wet the gauze with saline the squeeze as much saline out so the gauze is moist (not soaking wet), place moistened gauze over wound, then place a dry gauze over the moist one, followed by Kerlix wrap, then ace wrap.   DVT/PE prophylaxis: lovenox 40 mg subcutaneous injection daily x 28 days   Diet: carbohydrate modified diet.  Can use over the counter stool softeners and bowel preparations, such as Miralax, to help with bowel movements.  Narcotics can be constipating.  Be sure to drink plenty of fluids  PAIN MEDICATION USE AND EXPECTATIONS  You have likely been given narcotic medications to help control your pain.  After a traumatic event that results in an fracture (broken bone) with or without surgery, it is ok to use narcotic pain medications to help control one's pain.  We understand that everyone responds to pain differently and each individual patient will be evaluated on a regular basis for the continued need for narcotic medications. Ideally, narcotic medication use should last no more than 6-8 weeks (coinciding with fracture healing).   As a patient it is your responsibility as well to monitor narcotic medication use and report the amount and frequency you use these medications when you come to your office visit.   We would also advise that if you are using narcotic medications, you should take a dose prior to therapy to maximize you participation.  IF YOU ARE ON NARCOTIC MEDICATIONS IT IS NOT PERMISSIBLE TO OPERATE A MOTOR VEHICLE (MOTORCYCLE/CAR/TRUCK/MOPED) OR HEAVY  MACHINERY DO NOT MIX NARCOTICS WITH OTHER CNS ([place]) DEPRESSANTS SUCH AS ALCOHOL   STOP SMOKING OR USING NICOTINE PRODUCTS!!!!  As discussed nicotine severely impairs your body's ability to heal surgical and traumatic wounds but also impairs bone healing.  Wounds and bone heal by forming microscopic blood vessels (angiogenesis) and nicotine  is a vasoconstrictor (essentially, shrinks blood vessels).  Therefore, if vasoconstriction occurs to these microscopic blood vessels they essentially disappear and are unable to deliver necessary nutrients to the healing tissue.  This is one modifiable factor that you can do to dramatically increase your chances of healing your injury.    (This means no smoking, no nicotine gum, patches, etc)  DO NOT USE NONSTEROIDAL ANTI-INFLAMMATORY DRUGS (NSAID'S)  Using products such as Advil (ibuprofen), Aleve (naproxen), Motrin (ibuprofen) for additional pain control during fracture healing can delay and/or prevent the healing response.  If you would like to take over the counter (OTC) medication, Tylenol (acetaminophen) is ok.  However, some narcotic medications that are given for pain control contain acetaminophen as well. Therefore, you should not exceed more than 4000 mg of tylenol in a day if you do not have liver disease.  Also note that there are may OTC medicines, such as cold medicines and allergy medicines that my contain tylenol as well.  If you have any questions about medications and/or interactions please ask your doctor/PA or your pharmacist.      ICE AND ELEVATE INJURED/OPERATIVE EXTREMITY  Using ice and elevating the injured extremity above your heart can help with swelling and pain control.  Icing in a pulsatile fashion, such as 20 minutes on and 20 minutes off, can be followed.    Do not place ice directly on skin. Make sure there is a barrier between to skin and the ice pack.    Using frozen items such as frozen peas works well as the conform nicely to the are that needs to be iced.  USE AN ACE WRAP OR TED HOSE FOR SWELLING CONTROL  In addition to icing and elevation, Ace wraps or TED hose are used to help limit and resolve swelling.  It is recommended to use Ace wraps or TED hose until you are informed to stop.    When using Ace Wraps start the wrapping distally (farthest away from the body)  and wrap proximally (closer to the body)   Example: If you had surgery on your leg or thing and you do not have a splint on, start the ace wrap at the toes and work your way up to the thigh        If you had surgery on your upper extremity and do not have a splint on, start the ace wrap at your fingers and work your way up to the upper arm  IF YOU ARE IN A SPLINT OR CAST DO NOT [place]   If your splint gets wet for any reason please contact the office immediately. You may shower in your splint or cast as long as you keep it dry.  This can be done by wrapping in a cast cover or garbage back (or similar)  Do Not stick any thing down your splint or cast such as pencils, money, or hangers to try and scratch yourself with.  If you feel itchy take benadryl as prescribed on the bottle for itching  IF YOU ARE IN A CAM BOOT (BLACK BOOT)  You may remove boot periodically. Perform daily dressing changes  as noted below.  Wash the liner of the boot regularly and wear a sock when wearing the boot. It is recommended that you sleep in the boot until told otherwise    Call office for the following:  Temperature greater than 101F  Persistent nausea and vomiting  Severe uncontrolled pain  Redness, tenderness, or signs of infection (pain, swelling, redness, odor or green/yellow discharge around the site)  Difficulty breathing, headache or visual disturbances  Hives  Persistent dizziness or light-headedness  Extreme fatigue  Any other questions or concerns you may have after discharge  In an emergency, call 911 or go to an Emergency Department at a nearby hospital    [place]: [phone]   VISIT OUR WEBSITE FOR ADDITIONAL INFORMATION: [email or web address]     May consider Ensure Max, Premier Protein, Glucerna shakes for meals.

## 2019-03-22 NOTE — Progress Notes (Signed)
1100 Pt's blood sugar 459, Dr Sandi Carne notified.

## 2019-03-22 NOTE — Progress Notes (Signed)
Family Medicine Teaching Service Daily Progress Note Intern Pager: [phone]  Patient name: [name] Medical record number: [id number] Date of birth: Jan 02, 1946 Age: 73 y.o. Gender: male  Primary Care Provider: Jeanie Sewer, DO Consultants: Ortho  Code Status: FULL Pt Overview and Major Events to Date:  11/30-admitted  12/01-Periprosthetic femur fracture revision  Assessment and Plan:  Periprosthetic femur fracture 2/2 mechanical fall-treating Doing well, pain adequately treated with oxycodone  Day 3 postop ORIF for left distal femur fracture -Orthopedic sugery following, appreciate recommendations  -Pt has bed available at [facility] on 12/5 -Vitals per routine  -PT/OT -Tylenol 650mg  Q6H PRN -Oxycodone IR 5 mg Q6H PRN for severe pain -Zofran every Q5H -Continue heparin prophylaxis  HFpEF-chronic, stable Last echo 2017: LV EF 55-60% Denies SOB/chest pain  In: 960 mL Urine output 3.3 L Weight on 12/1 113.3 kgs, weight on admission 114.3 kgs  -Judicious use of IV fluids -RestartTorsemide 20 mg BID   Hypokalemia-treating  K 3.4 today, K3.8 on 12/3 -KDur 5meq -continue to monitor with BMP  AKI-improving Cr 1.1 today, Cr 1.09 on 12/3, baseline ~0.9-1.0 -Avoid nephrotoxic agents -Continue to monitor with BMP  HTN-chronic, stable BP 0000000 to [id number] systolic Home meds include Coreg 25 mg twice daily, Imdur 30 mg daily, amlodipine 10 mg and torsemide 20mg  BID. Not taking lisinopril as developed a cough. -Continue to monitor BP  -Continue home Coreg, Imdur, torsemide -Restart amlodipine   Dysuria-resolved -UA negative  CAD w/ stents  Hx of MI-stable  Home meds: 81 mg aspirin daily, Lipitor 80 mg daily.  Cardiology cleared patient for surgery today. -Continue home meds  T2DM-chronic, treating CBG 302, 398 over last 24 hours  Home meds: Victoza 1.8 mg in the morning, Metformin 500 mg BID and acarbose 100 mg TID.   2 units Novolog  given today as hyperglycemic -Restart Victoza today  -Continue home medications and Lantus 10 units -Monitor CBGs with meals and bedtime  COPD-chronic, stable Prior CPAP at night.  Follows with Dr. Arlis Porta. Home medications Symbicort and Albuterol inhaler.Has not needed rescue inhaler recently. Ruthe Mannan daily -Incentive spirometry   GERD-chronic, stable Home meds:  Protonix 40 mg daily -Continue Protinix   Gout-chronic, stable  Home meds: allopurinol 300 mg am -Continue allopurinol   Hypercholesterolemia-chronic, stable Home meds: 80 mg atorvastatin -Continue statin  Peripheral neuropathy-chronic, stable Home medications include gabapentin 600 mg at bedtime. - Continue Gabapentin   Allergic rhinitis-chronic, stable Home medications include Atrovent, Xyzal 5 mg at bedtime, Singulair 10 mg daily, -Continue Singulair  Insomnia-neck, stable Home medications include 10 mg Ambien at bedtime. -Continue home medication  FEN/GI: normal diet  Prophylaxis: Heparin  Disposition: D/c on 12/5 to SNF, bed available  Subjective:  Doing well, pain adequately treated with oxycodone . Has not been taking miralax and RN told him he would need assistance with the bathroom. I explained it is a gentle laxative which would work slowly to encourage bowel movement.  Objective: Temp:  [98.3 F (36.8 C)-99.5 F (37.5 C)] 98.9 F (37.2 C) (12/04 0516) Pulse Rate:  [74-82] 74 (12/04 0516) Resp:  [16-18] 16 (12/04 0516) BP: (133-157)/(60-69) 157/62 (12/04 0516) SpO2:  [93 %-99 %] 93 % (12/04 0516)  General: Alert, cooperative and appears to be in no acute distress Cardio: Normal S1 and S2, RRR, No murmurs or rubs.   Pulm: CTAB, no crackles, wheezing, or diminished breath sounds. Normal respiratory effort Abdomen: Bowel sounds normal. Abdomen soft and non-tender.  Extremities: No  peripheral edema. Warm/ well perfused.  Strong radial pulse. Neuro: Cranial nerves grossly  intact  Laboratory: Recent Labs  Lab 03/20/19 0535 03/21/19 0259 03/22/19 0312  WBC 13.2* 10.2 9.0  HGB 9.7* 8.9* 8.6*  HCT 28.9* 25.2* 25.1*  PLT 138* 120* 144*   Recent Labs  Lab 03/19/19 0557  03/20/19 0535 03/21/19 0259 03/22/19 0312  NA 137  --  139 136 136  K 3.5  --  4.3 3.8 3.4*  CL 102  --  102 101 98  CO2 26  --  28 26 28   BUN 12  --  19 17 20   CREATININE 1.21   < > 1.30* 1.09 1.11  CALCIUM 8.3*  --  8.3* 8.0* 8.4*  PROT 6.3*  --  6.0*  --  5.9*  BILITOT 0.8  --  0.4  --  0.8  ALKPHOS 62  --  60  --  65  ALT 16  --  14  --  13  AST 19  --  19  --  16  GLUCOSE 394*  --  316* 258* 320*   < > = values in this interval not displayed.    Imaging/Diagnostic Tests: No results found.  Lattie Haw, MD 03/22/2019, 6:44 AM PGY-1, [place] Intern pager: [phone], text pages welcome

## 2019-03-22 NOTE — Progress Notes (Signed)
Orthopaedic Trauma Service Progress Note  Patient ID: [name] MRN: [id number] DOB/AGE: [birth year] 71 y.o.  Subjective:  Doing well Pain controlled No specific complaints  Waiting for SNF bed   cbg control still not ideal, looks like his SSI was dc'd, not sure why  ROS As above  Objective:   VITALS:   Vitals:   03/21/19 2114 03/22/19 0516 03/22/19 0739 03/22/19 0926  BP: (!) 146/60 (!) 157/62 (!) 160/58   Pulse: 82 74 90   Resp: 16 16 18    Temp: 98.3 F (36.8 C) 98.9 F (37.2 C) 98.2 F (36.8 C)   TempSrc: Oral Oral Oral   SpO2: 97% 93% 93% 92%  Weight:      Height:        Estimated body mass index is 35.84 kg/m as calculated from the following:   Height as of this encounter: 5\' 10"  (1.778 m).   Weight as of this encounter: 113.3 kg.   Intake/Output      12/03 0701 - 12/04 0700 12/04 0701 - 12/05 0700   P.O. 720 240   Total Intake(mL/kg) 720 (6.4) 240 (2.1)   Urine (mL/kg/hr) 2500 (0.9) 1000 (2.4)   Total Output 2500 1000   Net -1780 -760          LABS  Results for orders placed or performed during the hospital encounter of 03/18/19 (from the past 24 hour(s))  Glucose, capillary     Status: Abnormal   Collection Time: 03/21/19 11:09 AM  Result Value Ref Range   Glucose-Capillary 364 (H) 70 - 99 mg/dL  Glucose, capillary     Status: Abnormal   Collection Time: 03/21/19  4:27 PM  Result Value Ref Range   Glucose-Capillary 280 (H) 70 - 99 mg/dL  Glucose, capillary     Status: Abnormal   Collection Time: 03/21/19  9:09 PM  Result Value Ref Range   Glucose-Capillary 398 (H) 70 - 99 mg/dL   Comment 1 Notify RN   CBC     Status: Abnormal   Collection Time: 03/22/19  3:12 AM  Result Value Ref Range   WBC 9.0 4.0 - 10.5 K/uL   RBC 2.64 (L) 4.22 - 5.81 MIL/uL   Hemoglobin 8.6 (L) 13.0 - 17.0 g/dL   HCT 25.1 (L) 39.0 - 52.0 %   MCV 95.1 80.0 - 100.0 fL   MCH 32.6 26.0 -  34.0 pg   MCHC 34.3 30.0 - 36.0 g/dL   RDW 13.2 11.5 - 15.5 %   Platelets 144 (L) 150 - 400 K/uL   nRBC 0.0 0.0 - 0.2 %  Comprehensive metabolic panel     Status: Abnormal   Collection Time: 03/22/19  3:12 AM  Result Value Ref Range   Sodium 136 135 - 145 mmol/L   Potassium 3.4 (L) 3.5 - 5.1 mmol/L   Chloride 98 98 - 111 mmol/L   CO2 28 22 - 32 mmol/L   Glucose, Bld 320 (H) 70 - 99 mg/dL   BUN 20 8 - 23 mg/dL   Creatinine, Ser 1.11 0.61 - 1.24 mg/dL   Calcium 8.4 (L) 8.9 - 10.3 mg/dL   Total Protein 5.9 (L) 6.5 - 8.1 g/dL   Albumin 2.9 (L) 3.5 - 5.0 g/dL   AST 16 15 - 41 U/L  ALT 13 0 - 44 U/L   Alkaline Phosphatase 65 38 - 126 U/L   Total Bilirubin 0.8 0.3 - 1.2 mg/dL   GFR calc non Af Amer >60 >60 mL/min   GFR calc Af Amer >60 >60 mL/min   Anion gap 10 5 - 15  Glucose, capillary     Status: Abnormal   Collection Time: 03/22/19  6:23 AM  Result Value Ref Range   Glucose-Capillary 302 (H) 70 - 99 mg/dL     PHYSICAL EXAM:   Gen: awake, alert, NAD, very pleasant, appears well Lungs: unlabored Cardiac: regular  Ext:       Left Lower Extremity   Dressing and hinge brace fitting well  Dressing removed  Incisions look fantastic    No erythema   No active drainage   No other signs of infection   Swelling well controlled  Motor and sensory functions intact  No DCT   Compartments are soft     Assessment/Plan: 3 Days Post-Op   Principal Problem:   Periprosthetic supracondylar fracture of femur, left Active Problems:   Coronary atherosclerosis of native coronary artery   Essential hypertension   Diabetes ([place])   Anti-infectives (From admission, onward)   Start     Dose/Rate Route Frequency Ordered Stop   03/19/19 1400  ceFAZolin (ANCEF) IVPB 1 g/50 mL premix     1 g 100 mL/hr over 30 Minutes Intravenous Every 6 hours 03/19/19 1301 03/20/19 0231   03/19/19 0745  ceFAZolin (ANCEF) IVPB 2g/100 mL premix     2 g 200 mL/hr over 30 Minutes Intravenous To ShortStay  Surgical 03/18/19 2217 03/19/19 0835   03/19/19 0730  ceFAZolin (ANCEF) IVPB 2g/100 mL premix  Status:  Discontinued     2 g 200 mL/hr over 30 Minutes Intravenous On call to O.R. 03/18/19 1929 03/18/19 2216   03/19/19 0600  ceFAZolin (ANCEF) IVPB 2g/100 mL premix  Status:  Discontinued     2 g 200 mL/hr over 30 Minutes Intravenous On call to O.R. 03/18/19 1640 03/18/19 2216    .  POD/HD#: 9  73 year old white male S/P fall with left periprosthetic supracondylar distal femur fracture     -Fall   -Left periprosthetic supracondylar distal femur fracture S/P ORIF 03/19/2019 (Dr. Marcelino Scot)             Touchdown weightbearing left leg for 8 weeks             Unrestricted range of motion left knee immediately             Hinged knee brace will be unlocked for full range of motion                         Only needs to wear brace when mobilizing.  May be out of it otherwise                Do not place pillows under the bend of the knee to prevent the development of contractures.  Place pillows under the ankle to help keep leg elevated and to keep knee in full extension when not working on range of motion exercises               Ice and elevate for swelling and pain control               PT and OT evaluations  PT- please teach HEP for left knee ROM- AROM, PROM. No ROM restrictions.  Quad sets, SLR, LAQ, SAQ, heel slides, stretching, prone flexion and extension                           Ankle theraband program, heel cord stretching, toe towel curls, etc   Dressing changed today   Next dressing change 03/25/2019, then change daily. Once wound dry ok to leave open to the air   Only clean with soap and water.  NO ointments or lotions   Suture removal around 04/01/2019     - Pain management:             Continue with current regimen.  Usage appears to be reasonable                         Scheduled Tylenol 1000 mg every 8 hours                         OxyIR 5 to 10 mg  every 4 hours as needed for severe pain               Patient is on gabapentin chronically   - ABL anemia/Hemodynamics             Stable            renal function improved   - Medical issues              Per primary team               Diabetes                        per medical team    Sugars still elevated   Consistent cbgs >200 increase risk of infection and nonunion     Looks like today will be the first day that pt will be on is entire home regimen but his admission hgb a1c suggests that this regimen needs to be adjusted.  Do not know what his previous labs were    - DVT/PE prophylaxis:             Lovenox 40 mg subcu daily for 28 days   - ID:              Perioperative antibiotics completed    - Metabolic Bone Disease:             Vitamin D levels are low normal.  I did increase his dose to 2000 IUs daily of vitamin D3.  Also started on vitamin C.  Studies have shown that vitamin D levels greater than 40 improve musculoskeletal function and decrease fall risk   - Activity:             PT and OT             Touchdown weightbearing left leg   - FEN/GI prophylaxis/Foley/Lines:             Carb mod diet  - Impediments to fracture healing:             Diabetes             Copd/asthma              - Dispo:            ortho issues stable  Can dc to snf from our standpoint when bed available would like cbgs to be a bit better controlled but this can be adjusted at facility   Follow up with ortho in 10-14 days      Jari Pigg, PA-C [phone] (C) 03/22/2019, 10:40 AM  [facility] [place] [address] [phone] Jenetta [phone] (F)   After 6pm on weekdays please call office number to get in touch with on call provider or refer to [place] and look to see who is on call for the Sports Medicine Call Group which is listed under orthopaedics   On Weekends please call office number to get in touch with on call provider or refer  to [place] and look to see who is on call for the Sports Medicine Call Group which is listed under orthopaedics

## 2019-03-22 NOTE — Progress Notes (Addendum)
Inpatient Diabetes Program Recommendations  AACE/ADA: New Consensus Statement on Inpatient Glycemic Control (2015)  Target Ranges:  Prepandial:   less than 140 mg/dL      Peak postprandial:   less than 180 mg/dL (1-2 hours)      Critically ill patients:  140 - 180 mg/dL   Lab Results  Component Value Date   GLUCAP 459 (H) 03/22/2019   HGBA1C 8.8 (H) 03/18/2019    Review of Glycemic Control Results for GAREN, MCCLUSKY (MRN [id number]) as of 03/22/2019 12:15  Ref. Range 03/21/2019 06:36 03/21/2019 11:09 03/21/2019 16:27 03/21/2019 21:09 03/22/2019 06:23 03/22/2019 10:49  Glucose-Capillary Latest Ref Range: 70 - 99 mg/dL 274 (H) 364 (H) 280 (H) 398 (H) 302 (H) 459 (H)    Diabetes history:DM2 Outpatient Diabetes medications:Acarbose 100 mg TID with meals, Victoza 1.8 mg QAM, Metformin 500 mg BID  Current orders for Inpatient glycemic control:Lantus 10 units daily, Metformin 500 mg bid, Acarbose 100 mg tid with meals  Inpatient Diabetes Program Recommendations:  Glucose trends worse today. Glucose >400 1049 am  -Increase Lantus to 20 units QD.  - Add Novolog 0-15 units tid in addition to Novolog bedtime scale  - Add Novolog 3 units TID with meals for meal coverage if patient eats at least 50% of meals.  Diet: Please discontinue Regular diet and order Carb Modified diet.  NOTE: Noted patient received Decadron 4 mg at 9am on 03/19/19 which is contributing to hyperglycemia.  Addendum 1500 pm: Spoke with patient regarding A1c level. Pt reports skipping meal for glycemic control at home. He checks his glucose twice a day and has seen his glucose as high as 250 at home.  Discussed the importance of glucose control postop. Discussed current A1c, glucose and A1c goals. Spoke with patient about consistent meal intake for glycemic control in addition to important nutrition for wound healing.  Pt reports his wife is at a SNF for rehab from surgery 3 weeks ago. Pt still works for tree  cutting business doing quotes.  Patient will check glucose 3 times a day at the advise from MD. Will call his PCP for glucose consistently >180.  Thanks, Tama Headings RN, MSN, BC-ADM Inpatient Diabetes Coordinator Team Pager [phone] (8a-5p)

## 2019-03-22 NOTE — Progress Notes (Signed)
Patient declines CPAP this night.

## 2019-03-22 NOTE — Progress Notes (Signed)
   03/22/19 0952  SNF Authorization Status  SNF Authorization Complete 03/22/19  SNF Auth Complete Time 0952  SNF Authorization Status Complete   Per Luetta Nutting ([employer] rep); insurance auth for [place] has been received; Auth#: [id number] Bell ref#: [id number]  Midge Minium MSN, RN, NCM-BC, ACM-RN [phone]

## 2019-03-22 NOTE — Progress Notes (Signed)
FPTS Interim Progress Note  Discussed with RN caring for patient.  Patient's friend was able to come today but was unable to find patient's Victoza at home.  Given that patient still does not have Victoza we will plan to continue regimen of Lantus 20 units with sliding scale.  Patient is also on acarbose.  Will need to continue this regimen and adjust as needed to maintain adequate glycemic control to promote healing.  If patient's friend can find Victoza at home can consider starting that and adjusting Lantus and sliding scale.  Caroline More, DO 03/22/2019, 5:31 PM PGY-3, [place] Medicine Service pager [phone]

## 2019-03-23 LAB — COMPREHENSIVE METABOLIC PANEL
ALT: 14 U/L (ref 0–44)
AST: 17 U/L (ref 15–41)
Albumin: 3 g/dL — ABNORMAL LOW (ref 3.5–5.0)
Alkaline Phosphatase: 70 U/L (ref 38–126)
Anion gap: 11 (ref 5–15)
BUN: 24 mg/dL — ABNORMAL HIGH (ref 8–23)
CO2: 29 mmol/L (ref 22–32)
Calcium: 8.8 mg/dL — ABNORMAL LOW (ref 8.9–10.3)
Chloride: 98 mmol/L (ref 98–111)
Creatinine, Ser: 1.18 mg/dL (ref 0.61–1.24)
GFR calc Af Amer: >60 mL/min (ref >60)
GFR calc non Af Amer: >60 mL/min (ref >60)
Glucose, Bld: 245 mg/dL — ABNORMAL HIGH (ref 70–99)
Potassium: 3.6 mmol/L (ref 3.5–5.1)
Sodium: 138 mmol/L (ref 135–145)
Total Bilirubin: 0.7 mg/dL (ref 0.3–1.2)
Total Protein: 6.7 g/dL (ref 6.5–8.1)

## 2019-03-23 LAB — CBC
HCT: 26.6 % — ABNORMAL LOW (ref 39.0–52.0)
Hemoglobin: 9.2 g/dL — ABNORMAL LOW (ref 13.0–17.0)
MCH: 32.6 pg (ref 26.0–34.0)
MCHC: 34.6 g/dL (ref 30.0–36.0)
MCV: 94.3 fL (ref 80.0–100.0)
Platelets: 166 10*3/uL (ref 150–400)
RBC: 2.82 MIL/uL — ABNORMAL LOW (ref 4.22–5.81)
RDW: 13.2 % (ref 11.5–15.5)
WBC: 8.6 10*3/uL (ref 4.0–10.5)
nRBC: 0 % (ref 0.0–0.2)

## 2019-03-23 LAB — GLUCOSE, CAPILLARY
Glucose-Capillary: 280 mg/dL — ABNORMAL HIGH (ref 70–99)
Glucose-Capillary: 337 mg/dL — ABNORMAL HIGH (ref 70–99)

## 2019-03-23 MED ORDER — INSULIN GLARGINE 100 UNIT/ML ~~LOC~~ SOLN: SUBCUTANEOUS | 3 refills | Status: DC

## 2019-03-23 NOTE — Progress Notes (Addendum)
Family Medicine Teaching Service Daily Progress Note Intern Pager: [phone]  Patient name: [name] Medical record number: [id number] Date of birth: 04-30-1945 Age: 73 y.o. Gender: male  Primary Care Provider: Jeanie Sewer, DO Consultants: Ortho  Code Status: FULL Pt Overview and Major Events to Date:  11/30-admitted  12/01-Periprosthetic femur fracture revision  Assessment and Plan:  Periprosthetic femur fracture 2/2 mechanical fall-treating Continues to do well postoperatively.  No new concerns Day 4 postop ORIF for left distal femur fracture -Orthopedic sugery following, appreciate recommendations  -Pt has bed available at [facility] [place] [facility] on 12/5 however patient wants to go home -Vitals per routine  -PT/OT -Tylenol 650mg  Q6H PRN -Oxycodone IR 5 mg Q6H PRN for severe pain -Zofran every Q5H -Continue heparin prophylaxis  HFpEF-chronic, stable Denies shortness of breath or chest pain Last echo 2017: LV EF 55-60% In: 960 mL  Urine output 2.8 L Weight on 12/1 113.3 kgs, weight on admission 114.3 kgs  -Judicious use of IV fluids -Torsemide 20 mg BID   Hypokalemia-stable K 3.6 today, K 3.4 on 12/4 -continue to monitor with BMP  AKI-resolved Cr 1.18 today, Cr 1.1 on 12/4, baseline ~0.9-1.0 -Avoid nephrotoxic agents -Continue to monitor with BMP  HTN-chronic, stable BPs 0000000 to [id number] systolic Home meds include Coreg 25 mg twice daily, Imdur 30 mg daily, amlodipine 10 mg and torsemide 20mg  BID. Not taking lisinopril as developed a cough. -Continue to monitor BP  -Continue home Coreg, Imdur, torsemide -Restart amlodipine   Dysuria-resolved -UA negative  CAD w/ stents  Hx of MI-stable  Home meds: 81 mg aspirin daily, Lipitor 80 mg daily.  Cardiology cleared patient for surgery today. -Continue home meds  T2DM-chronic, treating CBG 280, 323, 386 over last 24 hours  Home meds: Victoza 1.8 mg in the morning, Metformin 500 mg BID and  acarbose 100 mg TID.   Pt's friend unable to bring Victoza pen yesterday.  -Continue home medications stopped yesterday -Continue sensitve sliding scale  -Monitor CBGs with meals and bedtime  COPD-chronic, stable Prior CPAP at night.  Follows with Dr. Arlis Porta. Home medications Symbicort and Albuterol inhaler.Has not needed rescue inhaler recently. Ruthe Mannan daily -Incentive spirometry   GERD-chronic, stable Home meds:  Protonix 40 mg daily -Continue Protinix   Gout-chronic, stable  Home meds: allopurinol 300 mg am -Continue allopurinol   Hypercholesterolemia-chronic, stable Home meds: 80 mg atorvastatin -Continue statin  Peripheral neuropathy-chronic, stable Home medications include gabapentin 600 mg at bedtime. - Continue Gabapentin   Allergic rhinitis-chronic, stable Home medications include Atrovent, Xyzal 5 mg at bedtime, Singulair 10 mg daily, -Continue Singulair  Insomnia-neck, stable Home medications include 10 mg Ambien at bedtime. -Continue home medication  FEN/GI: normal diet  Prophylaxis: Heparin  Disposition: D/c home with home health with rollator  Subjective:  Continues to do well postoperatively.  No new concerns.  Like to know when he is going home as he needs to call his ride (ex daughter in law)  Objective: Temp:  [97.8 F (36.6 C)-98.6 F (37 C)] 97.8 F (36.6 C) (12/05 0306) Pulse Rate:  [72-86] 72 (12/05 0306) Resp:  [14-18] 14 (12/05 0306) BP: (144-152)/(64-77) 144/77 (12/05 0306) SpO2:  [91 %-94 %] 94 % (12/05 0306)  General: Alert, pleasant, cooperative and appears to be in no acute distress Cardio: Normal S1 and S2, RRR. No murmurs or rubs.   Pulm: Clear to auscultation bilaterally, no crackles, wheezing, or diminished breath sounds. Normal respiratory effort Abdomen: Bowel sounds normal. Abdomen soft  and non-tender.  Extremities: Left leg in boot. Non tender on palpation of LE. No peripheral edema. Warm/ well perfused.  Strong  radial pulse. Neuro: Cranial nerves grossly intact  Laboratory: Recent Labs  Lab 03/21/19 0259 03/22/19 0312 03/23/19 0407  WBC 10.2 9.0 8.6  HGB 8.9* 8.6* 9.2*  HCT 25.2* 25.1* 26.6*  PLT 120* 144* 166   Recent Labs  Lab 03/20/19 0535 03/21/19 0259 03/22/19 0312 03/23/19 0407  NA 139 136 136 138  K 4.3 3.8 3.4* 3.6  CL 102 101 98 98  CO2 28 26 28 29   BUN 19 17 20  24*  CREATININE 1.30* 1.09 1.11 1.18  CALCIUM 8.3* 8.0* 8.4* 8.8*  PROT 6.0*  --  5.9* 6.7  BILITOT 0.4  --  0.8 0.7  ALKPHOS 60  --  65 70  ALT 14  --  13 14  AST 19  --  16 17  GLUCOSE 316* 258* 320* 245*    Imaging/Diagnostic Tests: No results found.  Lattie Haw, MD 03/23/2019, 8:12 AM PGY-1, [place] Intern pager: [phone], text pages welcome

## 2019-03-23 NOTE — TOC Progression Note (Addendum)
Transition of Care [facility]) - Progression Note    Patient Details  Name: [name] MRN: [id number] Date of Birth: 02-07-1946  Transition of Care [facility]) CM/SW [place], [place] Phone Number: [phone] 03/23/2019, 11:42 AM  Clinical Narrative:     CSW was alerted by MD Posey Pronto that patient has changed his mind in regards to going to a SNF. CSW followed up with patient bedside to ascertain his discharge plan. Patient confimed that he wanted to return home with [facility]. CSW updated RN case manager of [facility] needs for patient.   CSW attempted to notify facility of change  However did not reach anyone and could not leave a message.  TOC will continue to follow for discharge planning needs.  Expected Discharge Plan: [place] Barriers to Discharge: Continued Medical Work up, SNF Pending bed offer, Ship broker  Expected Discharge Plan and Services Expected Discharge Plan: [place] In-house Referral: NA Discharge Planning Services: CM Consult Post Acute Care Choice: [place] Living arrangements for the past 2 months: Single Family Home                 DME Arranged: N/A DME Agency: NA       HH Arranged: NA HH Agency: NA         Social Determinants of Health (SDOH) Interventions    Readmission Risk Interventions No flowsheet data found.

## 2019-03-23 NOTE — Progress Notes (Signed)
Discharge education addressed; Pt in stable condition;not in respiratory distress.Pt to be picked up by daughter- in -law at the [employer] entrance.

## 2019-03-23 NOTE — TOC Transition Note (Addendum)
Transition of Care [facility]) - CM/SW Discharge Note   Patient Details  Name: [name] MRN: [id number] Date of Birth: 1945-09-01  Transition of Care [facility]) CM/SW Contact:  Claudie Leach, RN Phone Number: [phone] 03/23/2019, 4:13 PM   Clinical Narrative:    Patient changed mind this morning regarding going to SNF and decided to go home with [facility].  Patient reports he has friends and family to help as needed.  He states he has DME O2 through [facility] and his wife's RW while she is in SNF.  He is adamant about going home and agreeable to [facility].    Rated list by Medicare reviewed with patient.  Patient has no preference.  Referral accepted by [place].     Patient does not want to wait for RW to be delivered.  RW will be shipped to patient by [employer] and will arrive at his home Tuesday or Wednesday.   Patient daughter in law will provide transport home.     Final next level of care: [place] Barriers to Discharge: No Barriers Identified   Patient Goals and CMS Choice Patient states their goals for this hospitalization and ongoing recovery are:: "to get better" CMS Medicare.gov Compare Post Acute Care list provided to:: Patient Choice offered to / list presented to : Patient  Discharge Placement              Patient chooses bed at: Other - please specify in the comment section below:([facility]) Patient to be transferred to facility by: [place] Name of family member notified: patient    Discharge Plan and Services In-house Referral: NA Discharge Planning Services: CM Consult Post Acute Care Choice: [place]          DME Arranged: Gilford Rile rolling DME Agency: [employer] Date DME Agency Contacted: 03/23/19 Time DME Agency Contacted: [phone] Representative spoke with at DME Agency: [place]: PT, RN, OT, Social Work [employer] Agency: [place] ([place]) Date [place]: 03/23/19 Time [place]: [place] Representative spoke with at [place]: [place] ([place]) Interventions     Readmission Risk Interventions No flowsheet data found.

## 2019-03-23 NOTE — Progress Notes (Addendum)
Went to speak with patient regarding discharge and that I was concerned that he would not be able to manage at home alone post operatively. He reiterated that he wants to go home and mentioned that he has 2 friends staying with him and helping him out "as long as I need". Does not want to go to SNF because he does not like the rules there despite his wife being there. Explained to pt that we are prescribing him insulin on discharge given his blood sugars have been high. He understood this and the RN wille explain how to administer the medication. He understands he should follow up with the PCP within 2 weeks and also see orthopedics for follow up.  Medically fit for discharge. Discharge orders are in.  Lattie Haw PGY1, [place]

## 2019-06-25 ENCOUNTER — Encounter: Payer: Self-pay | Admitting: Cardiology

## 2019-06-25 ENCOUNTER — Ambulatory Visit (INDEPENDENT_AMBULATORY_CARE_PROVIDER_SITE_OTHER): Payer: Medicare Other | Admitting: Cardiology

## 2019-06-25 ENCOUNTER — Encounter: Payer: Self-pay | Admitting: *Deleted

## 2019-06-25 ENCOUNTER — Other Ambulatory Visit: Payer: Self-pay

## 2019-06-25 VITALS — BP 118/68 | HR 77 | Ht 70.0 in | Wt 242.8 lb

## 2019-06-25 DIAGNOSIS — R600 Localized edema: Secondary | ICD-10-CM

## 2019-06-25 DIAGNOSIS — I2510 Atherosclerotic heart disease of native coronary artery without angina pectoris: Secondary | ICD-10-CM

## 2019-06-25 DIAGNOSIS — I10 Essential (primary) hypertension: Secondary | ICD-10-CM

## 2019-06-25 DIAGNOSIS — E782 Mixed hyperlipidemia: Secondary | ICD-10-CM

## 2019-06-25 MED ORDER — ISOSORBIDE MONONITRATE ER 30 MG PO TB24: 45.0000 mg | ORAL_TABLET | Freq: Every day | ORAL | 1 refills | Status: DC

## 2019-06-25 NOTE — Patient Instructions (Signed)
Your physician recommends that you schedule a follow-up appointment in: [place] has recommended you make the following change in your medication:   INCREASE IMDUR 45 MG DAILY  (1 AND 1/2 TABLETS) DAILY   Thank you for choosing [facility]!!

## 2019-06-25 NOTE — Progress Notes (Signed)
Clinical Summary [name] is a 74 y.o.male seen today for follow up of the following medical problems.  1. CAD  - hx of BMS to LAD in 2003  - cath 01/2011 with LM patent, LAD with prox stent mild ISR, patent mid stent, 90% mid LAD lesion. LCX 30% prox, OM2 30-40%, RCA small non-dom with no disease. Received additional stent to LAD at that time, a septal Hollynn Garno was jailed. LVEF 65% by LV gram.  - exercise cardiolite from 08/2013 from prior cardiologist. 6 minutes with no ischemic changes, 87%THR, LVEF 58%, moderate size mild intensity partially reversible inferior defect thought to be soft tissue attenuation but cannot rule out RCA ischemia -[name] admit with chest pain 04/2014. Sharp pain mid to left chest, 10/10. No other associated symptoms. Not positional. Better with NG. Symptoms would last just a few minutes at at time. Lexiscan MPI during admission with no ischemia, LVEF 60%. 05/2014 cath at [place] patent coronaries -01/2017 echo: LVEF 0000000, grade I diastolic dysfunction   [id number] nuclear stress: probable normal study, cannot exclude small distal inferior ischemia. Low risk     - some recent chest pain. Pressure. Often comes on with stress. No other associated. Lasts 3-4 minutes. Last episode episode 1 week ago. Not related to food. Not positional - sedentary lifestyle due leg pains, no recent SOB/DOE - 3-4 episodes since Jan - use NG once with some improvement.  2. LE swelling - ongoing swelling at times, resolves at night. Wears compressoin stockings.     3. HTN -compliant with meds   4. Hyperlipidemia - he is compliant with statin   5. COPD - night time oxygen.  - followed by Dr Clydene Pugh  6. Femur fracture - admitted 03/2019 after fall - s/p surgery      Past Medical History:  Diagnosis Date  . Asthma   . Coronary atherosclerosis of native coronary artery  04/22/2001      Cardiac Catheterization  . Diverticulitis   .  Encounter for long-term (current) use of insulin ([place])   . Esophageal reflux   . Gout, unspecified   . Old myocardial infarction   . Personal history of tobacco use, presenting hazards to health   . Postsurgical percutaneous transluminal coronary angioplasty status   . Pure hypercholesterolemia   . 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 Unspecified essential hypertension      Allergies  Allergen Reactions  . Lisinopril Cough     Current Outpatient Medications  Medication Sig Dispense Refill  . acarbose (PRECOSE) 100 MG tablet Take 100 mg by mouth 3 (three) times daily.    Marland Kitchen allopurinol (ZYLOPRIM) 300 MG tablet Take 300 mg by mouth every morning.     Marland Kitchen amLODipine (NORVASC) 10 MG tablet Take 1 tablet (10 mg total) by mouth daily. (Patient taking differently: Take 10 mg by mouth every morning. ) 30 tablet 6  . aspirin EC 81 MG tablet Take 81 mg by mouth at bedtime.    Marland Kitchen atorvastatin (LIPITOR) 80 MG tablet TAKE 1 TABLET (80 MG TOTAL) BY MOUTH DAILY. (STOP SIMVASTATIN) (Patient taking differently: Take 80 mg by mouth at bedtime. ) 30 tablet 6  . azithromycin (ZITHROMAX) 250 MG tablet Take 250 mg by mouth every Monday, Wednesday, and Friday.    . benzonatate (TESSALON) 200 MG capsule Take 200 mg by mouth 3 (three) times daily as needed for cough.     Marland Kitchen  budesonide-formoterol (SYMBICORT) 160-4.5 MCG/ACT inhaler Inhale 2 puffs into the lungs 2 (two) times daily.    . carvedilol (COREG) 25 MG tablet Take 25 mg by mouth 2 (two) times daily with a meal.      . cholecalciferol (VITAMIN D) 25 MCG tablet Take 2 tablets (2,000 Units total) by mouth daily. 60 tablet 2  . enoxaparin (LOVENOX) 40 MG/0.4ML injection Inject 0.4 mLs (40 mg total) into the skin daily for 28 days. 11.2 mL 0  . gabapentin (NEURONTIN) 300 MG capsule Take 600 mg by mouth at bedtime.     . insulin glargine (LANTUS) 100 UNIT/ML injection Take 10  units daily at night 10 mL 3  . ipratropium (ATROVENT) 0.03 % nasal spray Place 2 sprays into both nostrils See admin instructions. Use 2 sprays in each nostril every morning, may also use 2 sprays in each nostril at night as needed for congestion    . isosorbide mononitrate (IMDUR) 30 MG 24 hr tablet TAKE 1 TABLET (30 MG TOTAL) BY MOUTH DAILY (Patient taking differently: Take 30 mg by mouth every morning. ) 30 tablet 0  . levocetirizine (XYZAL) 5 MG tablet Take 5 mg by mouth at bedtime.    . Liraglutide (VICTOZA) 18 MG/3ML SOPN Inject 1.8 mg into the skin every morning.     . metFORMIN (GLUCOPHAGE-XR) 500 MG 24 hr tablet Take 500 mg by mouth 2 (two) times daily.    . montelukast (SINGULAIR) 10 MG tablet Take 10 mg by mouth every morning.     . Multiple Vitamin (MULTIVITAMIN WITH MINERALS) TABS tablet Take 1 tablet by mouth every morning.    . Naphazoline HCl (CLEAR EYES OP) Place 1 drop into both eyes every morning.    . nitroGLYCERIN (NITROSTAT) 0.4 MG SL tablet Place 1 tablet (0.4 mg total) under the tongue every 5 (five) minutes as needed for chest pain. 25 tablet 3  . ondansetron (ZOFRAN) 4 MG tablet Take 4 mg by mouth every 6 (six) hours as needed for nausea or vomiting.     Marland Kitchen oxyCODONE-acetaminophen (PERCOCET) 5-325 MG tablet Take 1-2 tablets by mouth every 6 (six) hours as needed for moderate pain or severe pain. 50 tablet 0  . pantoprazole (PROTONIX) 40 MG tablet Take 40 mg by mouth every morning.     Marland Kitchen PRESCRIPTION MEDICATION Inhale into the lungs at bedtime. CPAP    . PRESCRIPTION MEDICATION Apply 1 application topically daily as needed (rash/itching on legs). Prescription cream    . torsemide (DEMADEX) 20 MG tablet Take 2 tablets (40 mg total) by mouth daily. (Patient taking differently: Take 20 mg by mouth 2 (two) times daily. ) 180 tablet 3  . vitamin C (VITAMIN C) 1000 MG tablet Take 1 tablet (1,000 mg total) by mouth daily. 30 tablet 0  . zolpidem (AMBIEN) 10 MG tablet Take 10 mg by  mouth at bedtime.      No current facility-administered medications for this visit.     Past Surgical History:  Procedure Laterality Date  . 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 CARDIAC CATHETERIZATION  2003   70% proximal LAD (plaque rupture), 40 % mid. Left dominent  . CARDIAC CATHETERIZATION  01/26/2011   LAD: Patent proximal stent. 90% calcified eccentic stenosis in med segment, mild LCX disease  . CAROTID STENTS    . CORONARY ANGIOPLASTY WITH STENT PLACEMENT  2003   Prox LAD: 3.0 X12 BMS  . CORONARY ANGIOPLASTY WITH  STENT PLACEMENT  01/26/2011   Mid LAD: 3.0 X15 mm Vision BMS. Complicated by a jailed septal [name] and periprocedural MI  . HEMORRHOID SURGERY    . HERNIA REPAIR    . ORIF FEMUR FRACTURE Left 03/19/2019   Procedure: OPEN REDUCTION INTERNAL FIXATION (ORIF) DISTAL FEMUR FRACTURE;  Surgeon: Altamese Hartly, MD;  Location: [facility];  Service: Orthopedics;  Laterality: Left;     Allergies  Allergen Reactions  . Lisinopril Cough      Family History  Problem Relation Age of Onset  . Coronary artery disease Other        Family History     Social History [name] reports that he quit smoking about 34 years ago. His smoking use included cigarettes. He started smoking about 59 years ago. He has a 39.00 pack-year smoking history. He quit smokeless tobacco use about 34 years ago.  His smokeless tobacco use included snuff and chew. [name] reports current alcohol use.   Review of Systems CONSTITUTIONAL: No weight loss, fever, chills, weakness or fatigue.  HEENT: Eyes: No visual loss, blurred vision, double vision or yellow sclerae.No hearing loss, sneezing, congestion, runny nose or sore throat.  SKIN: No rash or itching.  CARDIOVASCULAR: per hpi RESPIRATORY: No shortness of breath, cough or sputum.  GASTROINTESTINAL: No anorexia, nausea, vomiting or diarrhea. No abdominal pain or blood.  GENITOURINARY: No burning on urination,  no polyuria NEUROLOGICAL: No headache, dizziness, syncope, paralysis, ataxia, numbness or tingling in the extremities. No change in bowel or bladder control.  MUSCULOSKELETAL: No muscle, back pain, joint pain or stiffness.  LYMPHATICS: No enlarged nodes. No history of splenectomy.  PSYCHIATRIC: No history of depression or anxiety.  ENDOCRINOLOGIC: No reports of sweating, cold or heat intolerance. No polyuria or polydipsia.  Marland Kitchen   Physical Examination Today's Vitals   06/25/19 1545  BP: 118/68  Pulse: 77  SpO2: 97%  Weight: 242 lb 12.8 oz (110.1 kg)  Height: 5\' 10"  (1.778 m)   Body mass index is 34.84 kg/m.  Gen: resting comfortably, no acute distress HEENT: no scleral icterus, pupils equal round and reactive, no palptable cervical adenopathy,  CV: RRR, no m/r/g, no jvd Resp: Clear to auscultation bilaterally GI: abdomen is soft, non-tender, non-distended, normal bowel sounds, no hepatosplenomegaly MSK: extremities are warm, no edema.  Skin: warm, no rash Neuro:  no focal deficits Psych: appropriate affect     Assessment and Plan   1. CAD - recent nonspecific chest pains. Prior stress test with possible small area of ischemia, we will try increasing imdur to 45mg  daily. If significant progression of symptoms would plan to repeat ischemic testing.   2. LE edema/SOB -prior Echo with normal LVEF, grade I diasotlic dysfunction - continue torsemide  3. HTN - at goal, continue current meds  4. Hyperlipidemia -continue statin, request pcp labs  F/u 4 months  Arnoldo Lenis, M.D.

## 2019-06-26 ENCOUNTER — Other Ambulatory Visit (HOSPITAL_COMMUNITY): Payer: Self-pay | Admitting: Orthopedic Surgery

## 2019-06-26 DIAGNOSIS — M79605 Pain in left leg: Secondary | ICD-10-CM

## 2019-07-02 ENCOUNTER — Ambulatory Visit (HOSPITAL_COMMUNITY)
Admission: RE | Admit: 2019-07-02 | Discharge: 2019-07-02 | Disposition: A | Discharge status: Home / Self Care | Payer: Medicare Other | Source: Ambulatory Visit | Attending: Orthopedic Surgery | Admitting: Orthopedic Surgery

## 2019-07-02 ENCOUNTER — Other Ambulatory Visit: Payer: Self-pay

## 2019-07-02 DIAGNOSIS — M79605 Pain in left leg: Secondary | ICD-10-CM | POA: Diagnosis not present

## 2019-08-02 ENCOUNTER — Other Ambulatory Visit: Payer: Self-pay

## 2019-08-02 ENCOUNTER — Encounter (HOSPITAL_COMMUNITY): Payer: Self-pay | Admitting: Orthopedic Surgery

## 2019-08-02 ENCOUNTER — Other Ambulatory Visit (HOSPITAL_COMMUNITY)
Admission: RE | Admit: 2019-08-02 | Discharge: 2019-08-02 | Disposition: A | Discharge status: Home / Self Care | Payer: Medicare Other | Source: Ambulatory Visit | Attending: Orthopedic Surgery | Admitting: Orthopedic Surgery

## 2019-08-02 DIAGNOSIS — Z20822 Contact with and (suspected) exposure to covid-19: Secondary | ICD-10-CM | POA: Insufficient documentation

## 2019-08-02 DIAGNOSIS — Z01812 Encounter for preprocedural laboratory examination: Secondary | ICD-10-CM | POA: Diagnosis present

## 2019-08-02 NOTE — Progress Notes (Addendum)
[name]  Your procedure is scheduled on Tuesday April 20.  Report to [facility] Main Entrance "A" at 05:30 A.M., and check in at the Admitting office.  Call this number if you have problems the morning of surgery: [phone]  Call [phone] if you have any questions prior to your surgery date Monday-Friday 8am-4pm   Remember: Do not eat or drink after midnight the night before your surgery    Take these medicines the morning of surgery with A SIP OF WATER: allopurinol (ZYLOPRIM)  atorvastatin (LIPITOR)  budesonide-formoterol (SYMBICORT) - Please bring all inhalers with you the day of surgery.  carvedilol (COREG)  ipratropium (ATROVENT)  montelukast (SINGULAIR)  pantoprazole (PROTONIX)  Take if needed:  nitroGLYCERIN (NITROSTAT) oxyCODONE-acetaminophen (PERCOCET/ROXICET)   As of today, STOP taking any Aspirin (unless otherwise instructed by your surgeon), Aleve, Naproxen, Ibuprofen, Motrin, Advil, Goody's, BC's, all herbal medications, fish oil, and all vitamins.   WHAT DO I DO ABOUT MY DIABETES MEDICATION?   . THE DAY BEFORE SURGERY o acarbose (PRECOSE) - take as usual  o Liraglutide ([place]) - take as usual o metFORMIN (GLUCOPHAGE-XR) - take as usual   . THE MORNING OF SURGERY o acarbose (PRECOSE) - NONE o Liraglutide (VICTOZA) - NONE  o metFORMIN (GLUCOPHAGE-XR) - NONE  **The day of surgery, do not take other diabetes injectables, including Victoza (liraglutide)  **Do not take oral diabetes medicines (pills) the morning of surgery.  HOW TO MANAGE YOUR DIABETES BEFORE AND AFTER SURGERY  Why is it important to control my blood sugar before and after surgery? . Improving blood sugar levels before and after surgery helps healing and can limit problems. . A way of improving blood sugar control is eating a healthy diet by: o  Eating less sugar and carbohydrates o  Increasing activity/exercise o  Talking with your doctor about reaching your blood sugar  goals . High blood sugars (greater than 180 mg/dL) can raise your risk of infections and slow your recovery, so you will need to focus on controlling your diabetes during the weeks before surgery. . Make sure that the doctor who takes care of your diabetes knows about your planned surgery including the date and location.  How do I manage my blood sugar before surgery? . Check your blood sugar at least 4 times a day, starting 2 days before surgery, to make sure that the level is not too high or low. . Check your blood sugar the morning of your surgery when you wake up and every 2 hours until you get to the Short Stay unit. o If your blood sugar is less than 70 mg/dL, you will need to treat for low blood sugar: - Do not take insulin. - Treat a low blood sugar (less than 70 mg/dL) with  cup of clear juice (cranberry or apple), 4 glucose tablets, OR glucose gel. - Recheck blood sugar in 15 minutes after treatment (to make sure it is greater than 70 mg/dL). If your blood sugar is not greater than 70 mg/dL on recheck, call [phone] for further instructions. . Report your blood sugar to the short stay nurse when you get to Short Stay.  . If you are admitted to the hospital after surgery: o Your blood sugar will be checked by the staff and you will probably be given insulin after surgery (instead of oral diabetes medicines) to make sure you have good blood sugar levels. o The goal for blood sugar control after surgery is 80-180 mg/dL.  The Morning of Surgery  Do not wear jewelry  Do not wear lotions, powders, colognes, or deodorant Men may shave face and neck.  Do not bring valuables to the hospital.  [facility] is not responsible for any belongings or valuables.  If you are a smoker, DO NOT Smoke 24 hours prior to surgery  If you wear a CPAP at night please bring your mask the morning of surgery   Remember that you must have someone to transport you home after your surgery, and remain  with you for 24 hours if you are discharged the same day.   Please bring cases for contacts, glasses, hearing aids, dentures or bridgework because it cannot be worn into surgery.    Leave your suitcase in the car.  After surgery it may be brought to your room.  For patients admitted to the hospital, discharge time will be determined by your treatment team.  Patients discharged the day of surgery will not be allowed to drive home.    Day of Surgery:  Please shower the morning of surgery Do not apply any deodorants/lotions. Please wear clean clothes to the hospital/surgery center.   Remember to brush your teeth WITH YOUR REGULAR TOOTHPASTE.   Please read over the following fact sheets that you were given.    PCP Morrison Old, MD (PCP manages diabetes) Cardiologist - Harl Bowie, MD   Chest x-ray - 1 view 02/2019 EKG - 03/2019 Stress Test - pt denies ECHO - 01/2017 Cardiac Cath - 2015  Sleep Study -  CPAP - yes  Fasting Blood Sugar -   Checks Blood Sugar twice a day  Aspirin Instructions: Follow your surgeon's instructions on when to stop Aspirin.  If no instructions were given by your surgeon then you will need to call the office to get those instructions.      COVID TEST- 08/02/19 @ 1400   Anesthesia review:   Patient denies shortness of breath, fever, cough and chest pain at PAT appointment   All instructions explained to the patient, with a verbal understanding of the material. Patient agrees to go over the instructions while at home for a better understanding. Patient also instructed to self quarantine after being tested for COVID-19. The opportunity to ask questions was provided.

## 2019-08-03 LAB — SARS CORONAVIRUS 2 (TAT 6-24 HRS): SARS Coronavirus 2: NEGATIVE

## 2019-08-05 NOTE — H&P (Signed)
Orthopaedic Trauma Service (OTS) Consult   Patient ID: [name] MRN: [id number] DOB/AGE: 01/11/1946 74 y.o.    HPI: [name] is an 74 y.o. male with a past medical history notable for CAD history tobacco use who sustained a left distal femur fracture that was treated with open reduction internal fixation March 19, 2019.  Patient continued to have pain with escalation despite being several months postop.  After discussion CT scan was obtained which did demonstrate nonunion of his fracture site with failure of any evidence of consolidation or progress to consolidation.  Patient presents today for repair of left femur nonunion  Past Medical History:  Diagnosis Date  . Asthma   . Coronary atherosclerosis of native coronary artery  04/22/2001      Cardiac Catheterization  . Diverticulitis   . Encounter for long-term (current) use of insulin ([place])   . Esophageal reflux   . Gout, unspecified   . Old myocardial infarction   . Personal history of tobacco use, presenting hazards to health   . Postsurgical percutaneous transluminal coronary angioplasty status   . Pure hypercholesterolemia   . Sleep apnea   . 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 Unspecified essential hypertension     Past Surgical History:  Procedure Laterality Date  . 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 CARDIAC CATHETERIZATION  2003   70% proximal LAD (plaque rupture), 40 % mid. Left dominent  . CARDIAC CATHETERIZATION  01/26/2011   LAD: Patent proximal stent. 90% calcified eccentic stenosis in med segment, mild LCX disease  . CAROTID STENTS    . CORONARY ANGIOPLASTY WITH STENT PLACEMENT  2003   Prox LAD: 3.0 X12 BMS  . CORONARY ANGIOPLASTY WITH STENT PLACEMENT  01/26/2011   Mid LAD: 3.0 X15 mm Vision BMS. Complicated by a jailed septal branch and periprocedural MI  .  HEMORRHOID SURGERY    . HERNIA REPAIR    . ORIF FEMUR FRACTURE Left 03/19/2019   Procedure: OPEN REDUCTION INTERNAL FIXATION (ORIF) DISTAL FEMUR FRACTURE;  Surgeon: Altamese [place], MD;  Location: [facility];  Service: Orthopedics;  Laterality: Left;    Family History  Problem Relation Age of Onset  . Coronary artery disease Other        Family History    Social History:  reports that he quit smoking about 34 years ago. His smoking use included cigarettes. He started smoking about 60 years ago. He has a 39.00 pack-year smoking history. He quit smokeless tobacco use about 34 years ago.  His smokeless tobacco use included snuff and chew. He reports current alcohol use. He reports that he does not use drugs.  Allergies:  Allergies  Allergen Reactions  . Lisinopril Cough    Medications: I have reviewed the patient's current medications. Current Meds  Medication Sig  . acarbose (PRECOSE) 100 MG tablet Take 100 mg by mouth 3 (three) times daily.  Marland Kitchen allopurinol (ZYLOPRIM) 300 MG tablet Take 300 mg by mouth daily.   Marland Kitchen amLODipine (NORVASC) 10 MG tablet Take 1 tablet (10 mg total) by mouth daily. (Patient taking differently: Take 10 mg by mouth at bedtime. )  . aspirin EC 81 MG tablet Take 81 mg by mouth daily.   Marland Kitchen atorvastatin (LIPITOR) 80 MG tablet TAKE 1 TABLET (80 MG TOTAL) BY MOUTH DAILY. (STOP SIMVASTATIN) (Patient  taking differently: Take 80 mg by mouth daily. )  . azithromycin (ZITHROMAX) 250 MG tablet Take 250 mg by mouth every Monday, Wednesday, and Friday.  . budesonide-formoterol (SYMBICORT) 160-4.5 MCG/ACT inhaler Inhale 2 puffs into the lungs 2 (two) times daily.  . carvedilol (COREG) 25 MG tablet Take 25 mg by mouth 2 (two) times daily with a meal.    . cholecalciferol (VITAMIN D) 25 MCG tablet Take 2 tablets (2,000 Units total) by mouth daily.  Marland Kitchen gabapentin (NEURONTIN) 300 MG capsule Take 600 mg by mouth at bedtime.   Marland Kitchen ipratropium (ATROVENT) 0.03 % nasal spray Place 2 sprays into  both nostrils See admin instructions. Use 2 sprays in each nostril every morning, may also use 2 sprays in each nostril at night as needed for congestion  . isosorbide mononitrate (IMDUR) 30 MG 24 hr tablet Take 1.5 tablets (45 mg total) by mouth daily.  . Liraglutide (VICTOZA) 18 MG/3ML SOPN Inject 1.8 mg into the skin every morning.   . metFORMIN (GLUCOPHAGE-XR) 500 MG 24 hr tablet Take 500 mg by mouth 2 (two) times daily.  . montelukast (SINGULAIR) 10 MG tablet Take 10 mg by mouth 2 (two) times daily.   . Multiple Vitamin (MULTIVITAMIN WITH MINERALS) TABS tablet Take 1 tablet by mouth daily.   . Naphazoline HCl (CLEAR EYES OP) Place 1 drop into both eyes daily. Clear eyes conplete  . nitroGLYCERIN (NITROSTAT) 0.4 MG SL tablet Place 1 tablet (0.4 mg total) under the tongue every 5 (five) minutes as needed for chest pain.  Marland Kitchen oxyCODONE-acetaminophen (PERCOCET/ROXICET) 5-325 MG tablet Take 1 tablet by mouth every 12 (twelve) hours as needed.  . OXYGEN Inhale 2 L into the lungs at bedtime as needed (Cop[place]).  . pantoprazole (PROTONIX) 40 MG tablet Take 40 mg by mouth daily.   . potassium chloride (MICRO-K) 10 MEQ CR capsule Take 10 mEq by mouth daily.  Marland Kitchen PRESCRIPTION MEDICATION Inhale into the lungs at bedtime. CPAP  . torsemide (DEMADEX) 20 MG tablet Take 2 tablets (40 mg total) by mouth daily. (Patient taking differently: Take 30 mg by mouth 2 (two) times daily. )  . vitamin C (VITAMIN C) 1000 MG tablet Take 1 tablet (1,000 mg total) by mouth daily.  Marland Kitchen zolpidem (AMBIEN) 10 MG tablet Take 10 mg by mouth at bedtime.      No results found for this or any previous visit (from the past 48 hour(s)).  No results found.  Review of Systems  Constitutional: Negative for chills and fever.  Respiratory: Negative for shortness of breath.   Cardiovascular: Negative for chest pain and palpitations.  Gastrointestinal: Negative for abdominal pain, nausea and vomiting.  Genitourinary: Negative for urgency.    Neurological: Negative for headaches.   There were no vitals taken for this visit. Physical Exam Vitals reviewed.  Constitutional:      General: He is not in acute distress. Cardiovascular:     Rate and Rhythm: Normal rate and regular rhythm.  Pulmonary:     Effort: Pulmonary effort is normal. No respiratory distress.  Abdominal:     General: Bowel sounds are normal.     Palpations: Abdomen is soft.  Musculoskeletal:     Comments: Left lower extremity Lateral distal knee incision well-healed No acute deformity No significant swelling He is tender in the supracondylar region of his distal femur Distal motor and sensory functions are grossly intact No asymmetric swelling + DP pulse No gross instability at his fracture site Hip and ankle are  unremarkable No deep calf tenderness  Skin:    General: Skin is warm.  Neurological:     General: No focal deficit present.     Mental Status: He is alert and oriented to person, place, and time.  Psychiatric:        Mood and Affect: Mood normal.        Thought Content: Thought content normal.     Assessment/Plan:  74 year old male 4 and half months status post ORIF left distal femur fracture with nonunion  -Left distal femur fracture with nonunion   OR for repair of nonunion  Risks and benefits discussed, patient wished to proceed  Admit overnight for observation, pain control and therapies  Likely will allow touchdown weightbearing postoperatively, no range of motion restrictions postop  - Pain management:  Titrate accordingly  Used Percocet after her last procedure and tolerated well   - DVT/PE prophylaxis:  Lovenox perioperative  - ID:   Perioperative antibiotics  - Impediments to fracture healing:  Preoperative labs checked for possible correctable factors for nonunion awaiting final labs  - Dispo:  OR for repair of left distal femur   Jari Pigg, PA-C [phone] (C) 08/05/2019, 7:54 PM  [facility] [place] [address] [phone] Domingo Sep (F)

## 2019-08-06 ENCOUNTER — Inpatient Hospital Stay (HOSPITAL_COMMUNITY): Payer: Medicare Other | Admitting: Anesthesiology

## 2019-08-06 ENCOUNTER — Inpatient Hospital Stay (HOSPITAL_COMMUNITY): Payer: Medicare Other

## 2019-08-06 ENCOUNTER — Ambulatory Visit (HOSPITAL_COMMUNITY)
Admission: RE | Admit: 2019-08-06 | Discharge: 2019-08-06 | Disposition: A | Discharge status: Home / Self Care | Payer: Medicare Other | Attending: Orthopedic Surgery | Admitting: Orthopedic Surgery

## 2019-08-06 ENCOUNTER — Encounter (HOSPITAL_COMMUNITY)
Admission: RE | Disposition: A | Discharge status: Home / Self Care | Payer: Self-pay | Source: Home / Self Care | Attending: Orthopedic Surgery

## 2019-08-06 ENCOUNTER — Other Ambulatory Visit: Payer: Self-pay

## 2019-08-06 ENCOUNTER — Encounter (HOSPITAL_COMMUNITY): Payer: Self-pay | Admitting: Orthopedic Surgery

## 2019-08-06 DIAGNOSIS — Z792 Long term (current) use of antibiotics: Secondary | ICD-10-CM | POA: Insufficient documentation

## 2019-08-06 DIAGNOSIS — S7292XK Unspecified fracture of left femur, subsequent encounter for closed fracture with nonunion: Secondary | ICD-10-CM

## 2019-08-06 DIAGNOSIS — Z888 Allergy status to other drugs, medicaments and biological substances status: Secondary | ICD-10-CM | POA: Insufficient documentation

## 2019-08-06 DIAGNOSIS — Z7984 Long term (current) use of oral hypoglycemic drugs: Secondary | ICD-10-CM | POA: Insufficient documentation

## 2019-08-06 DIAGNOSIS — E119 Type 2 diabetes mellitus without complications: Secondary | ICD-10-CM | POA: Insufficient documentation

## 2019-08-06 DIAGNOSIS — Z419 Encounter for procedure for purposes other than remedying health state, unspecified: Secondary | ICD-10-CM

## 2019-08-06 DIAGNOSIS — Z7982 Long term (current) use of aspirin: Secondary | ICD-10-CM | POA: Diagnosis not present

## 2019-08-06 DIAGNOSIS — M109 Gout, unspecified: Secondary | ICD-10-CM | POA: Diagnosis not present

## 2019-08-06 DIAGNOSIS — Z87891 Personal history of nicotine dependence: Secondary | ICD-10-CM | POA: Insufficient documentation

## 2019-08-06 DIAGNOSIS — X58XXXD Exposure to other specified factors, subsequent encounter: Secondary | ICD-10-CM | POA: Diagnosis not present

## 2019-08-06 DIAGNOSIS — S72402K Unspecified fracture of lower end of left femur, subsequent encounter for closed fracture with nonunion: Secondary | ICD-10-CM | POA: Diagnosis not present

## 2019-08-06 DIAGNOSIS — I2510 Atherosclerotic heart disease of native coronary artery without angina pectoris: Secondary | ICD-10-CM | POA: Diagnosis not present

## 2019-08-06 DIAGNOSIS — Z955 Presence of coronary angioplasty implant and graft: Secondary | ICD-10-CM | POA: Insufficient documentation

## 2019-08-06 DIAGNOSIS — G4730 Sleep apnea, unspecified: Secondary | ICD-10-CM | POA: Insufficient documentation

## 2019-08-06 DIAGNOSIS — Z79899 Other long term (current) drug therapy: Secondary | ICD-10-CM | POA: Diagnosis not present

## 2019-08-06 DIAGNOSIS — I252 Old myocardial infarction: Secondary | ICD-10-CM | POA: Diagnosis not present

## 2019-08-06 DIAGNOSIS — I10 Essential (primary) hypertension: Secondary | ICD-10-CM | POA: Insufficient documentation

## 2019-08-06 DIAGNOSIS — Z7951 Long term (current) use of inhaled steroids: Secondary | ICD-10-CM | POA: Insufficient documentation

## 2019-08-06 DIAGNOSIS — K219 Gastro-esophageal reflux disease without esophagitis: Secondary | ICD-10-CM | POA: Insufficient documentation

## 2019-08-06 DIAGNOSIS — E7800 Pure hypercholesterolemia, unspecified: Secondary | ICD-10-CM | POA: Insufficient documentation

## 2019-08-06 DIAGNOSIS — J45909 Unspecified asthma, uncomplicated: Secondary | ICD-10-CM | POA: Insufficient documentation

## 2019-08-06 HISTORY — DX: Sleep apnea, unspecified: G47.30

## 2019-08-06 HISTORY — PX: ORIF FEMUR FRACTURE: SHX2119

## 2019-08-06 LAB — GLUCOSE, CAPILLARY
Glucose-Capillary: 105 mg/dL — ABNORMAL HIGH (ref 70–99)
Glucose-Capillary: 128 mg/dL — ABNORMAL HIGH (ref 70–99)

## 2019-08-06 LAB — CBC WITH DIFFERENTIAL/PLATELET
Abs Immature Granulocytes: 0.04 10*3/uL (ref 0.00–0.07)
Basophils Absolute: 0.1 10*3/uL (ref 0.0–0.1)
Basophils Relative: 1 %
Eosinophils Absolute: 0.4 10*3/uL (ref 0.0–0.5)
Eosinophils Relative: 4 %
HCT: 37.7 % — ABNORMAL LOW (ref 39.0–52.0)
Hemoglobin: 12.7 g/dL — ABNORMAL LOW (ref 13.0–17.0)
Immature Granulocytes: 0 %
Lymphocytes Relative: 27 %
Lymphs Abs: 2.5 10*3/uL (ref 0.7–4.0)
MCH: 31.1 pg (ref 26.0–34.0)
MCHC: 33.7 g/dL (ref 30.0–36.0)
MCV: 92.2 fL (ref 80.0–100.0)
Monocytes Absolute: 1.2 10*3/uL — ABNORMAL HIGH (ref 0.1–1.0)
Monocytes Relative: 13 %
Neutro Abs: 5 10*3/uL (ref 1.7–7.7)
Neutrophils Relative %: 55 %
Platelets: 235 10*3/uL (ref 150–400)
RBC: 4.09 MIL/uL — ABNORMAL LOW (ref 4.22–5.81)
RDW: 13.2 % (ref 11.5–15.5)
WBC: 9.1 10*3/uL (ref 4.0–10.5)
nRBC: 0 % (ref 0.0–0.2)

## 2019-08-06 LAB — COMPREHENSIVE METABOLIC PANEL
ALT: 15 U/L (ref 0–44)
AST: 21 U/L (ref 15–41)
Albumin: 3.6 g/dL (ref 3.5–5.0)
Alkaline Phosphatase: 90 U/L (ref 38–126)
Anion gap: 14 (ref 5–15)
BUN: 22 mg/dL (ref 8–23)
CO2: 24 mmol/L (ref 22–32)
Calcium: 8.8 mg/dL — ABNORMAL LOW (ref 8.9–10.3)
Chloride: 101 mmol/L (ref 98–111)
Creatinine, Ser: 1.62 mg/dL — ABNORMAL HIGH (ref 0.61–1.24)
GFR calc Af Amer: 49 mL/min — ABNORMAL LOW (ref >60)
GFR calc non Af Amer: 42 mL/min — ABNORMAL LOW (ref >60)
Glucose, Bld: 117 mg/dL — ABNORMAL HIGH (ref 70–99)
Potassium: 3.4 mmol/L — ABNORMAL LOW (ref 3.5–5.1)
Sodium: 139 mmol/L (ref 135–145)
Total Bilirubin: 0.9 mg/dL (ref 0.3–1.2)
Total Protein: 7.5 g/dL (ref 6.5–8.1)

## 2019-08-06 LAB — APTT: aPTT: 27 seconds (ref 24–36)

## 2019-08-06 LAB — HEMOGLOBIN A1C
Hgb A1c MFr Bld: 9.4 % — ABNORMAL HIGH (ref 4.8–5.6)
Mean Plasma Glucose: 223.08 mg/dL

## 2019-08-06 LAB — C-REACTIVE PROTEIN: CRP: 0.5 mg/dL (ref <1.0)

## 2019-08-06 LAB — VITAMIN D 25 HYDROXY (VIT D DEFICIENCY, FRACTURES): Vit D, 25-Hydroxy: 45.74 ng/mL (ref 30–100)

## 2019-08-06 LAB — PROTIME-INR
INR: 1.1 (ref 0.8–1.2)
Prothrombin Time: 13.9 seconds (ref 11.4–15.2)

## 2019-08-06 LAB — SEDIMENTATION RATE: Sed Rate: 53 mm/hr — ABNORMAL HIGH (ref 0–16)

## 2019-08-06 SURGERY — OPEN REDUCTION INTERNAL FIXATION (ORIF) DISTAL FEMUR FRACTURE
Anesthesia: General | Laterality: Left

## 2019-08-06 MED ORDER — CEFAZOLIN SODIUM-DEXTROSE 2-4 GM/100ML-% IV SOLN
INTRAVENOUS | Status: AC
  Filled 2019-08-06: qty 100

## 2019-08-06 MED ORDER — LACTATED RINGERS IV SOLN: INTRAVENOUS | Status: DC | PRN

## 2019-08-06 MED ORDER — PROPOFOL 10 MG/ML IV BOLUS
INTRAVENOUS | Status: DC | PRN
  Administered 2019-08-06: 120 mg via INTRAVENOUS

## 2019-08-06 MED ORDER — CELECOXIB 200 MG PO CAPS
200.0000 mg | ORAL_CAPSULE | Freq: Once | ORAL | Status: AC
  Filled 2019-08-06: qty 1

## 2019-08-06 MED ORDER — CEFAZOLIN SODIUM-DEXTROSE 2-3 GM-%(50ML) IV SOLR
INTRAVENOUS | Status: DC | PRN
  Administered 2019-08-06: 2 g via INTRAVENOUS

## 2019-08-06 MED ORDER — CARVEDILOL 12.5 MG PO TABS
ORAL_TABLET | ORAL | Status: AC
  Filled 2019-08-06: qty 2

## 2019-08-06 MED ORDER — PHENYLEPHRINE HCL-NACL 10-0.9 MG/250ML-% IV SOLN
INTRAVENOUS | Status: DC | PRN
  Administered 2019-08-06: 50 ug/min via INTRAVENOUS

## 2019-08-06 MED ORDER — CELECOXIB 200 MG PO CAPS
ORAL_CAPSULE | ORAL | Status: AC
  Administered 2019-08-06: 200 mg via ORAL
  Filled 2019-08-06: qty 1

## 2019-08-06 MED ORDER — CARVEDILOL 25 MG PO TABS
25.0000 mg | ORAL_TABLET | Freq: Once | ORAL | Status: AC
  Administered 2019-08-06: 25 mg via ORAL
  Filled 2019-08-06: qty 1

## 2019-08-06 MED ORDER — ONDANSETRON 4 MG PO TBDP: 4.0000 mg | ORAL_TABLET | Freq: Three times a day (TID) | ORAL | 0 refills | Status: DC | PRN

## 2019-08-06 MED ORDER — ACETAMINOPHEN 500 MG PO TABS
1000.0000 mg | ORAL_TABLET | Freq: Once | ORAL | Status: AC
  Administered 2019-08-06: 1000 mg via ORAL
  Filled 2019-08-06: qty 2

## 2019-08-06 MED ORDER — LACTATED RINGERS IV SOLN: INTRAVENOUS | Status: DC

## 2019-08-06 MED ORDER — FENTANYL CITRATE (PF) 100 MCG/2ML IJ SOLN
25.0000 ug | INTRAMUSCULAR | Status: DC | PRN
  Administered 2019-08-06: 25 ug via INTRAVENOUS
  Administered 2019-08-06: 50 ug via INTRAVENOUS
  Administered 2019-08-06: 25 ug via INTRAVENOUS

## 2019-08-06 MED ORDER — FENTANYL CITRATE (PF) 100 MCG/2ML IJ SOLN: 25.0000 ug | INTRAMUSCULAR | Status: DC | PRN

## 2019-08-06 MED ORDER — FENTANYL CITRATE (PF) 250 MCG/5ML IJ SOLN
INTRAMUSCULAR | Status: AC
  Filled 2019-08-06: qty 5

## 2019-08-06 MED ORDER — 0.9 % SODIUM CHLORIDE (POUR BTL) OPTIME
TOPICAL | Status: DC | PRN
  Administered 2019-08-06: 1000 mL

## 2019-08-06 MED ORDER — ONDANSETRON HCL 4 MG/2ML IJ SOLN
INTRAMUSCULAR | Status: DC | PRN
  Administered 2019-08-06: 4 mg via INTRAVENOUS

## 2019-08-06 MED ORDER — HYDROCODONE-ACETAMINOPHEN 7.5-325 MG PO TABS: 1.0000 | ORAL_TABLET | Freq: Four times a day (QID) | ORAL | 0 refills | Status: DC | PRN

## 2019-08-06 MED ORDER — ROCURONIUM BROMIDE 50 MG/5ML IV SOSY
PREFILLED_SYRINGE | INTRAVENOUS | Status: DC | PRN
  Administered 2019-08-06: 20 mg via INTRAVENOUS
  Administered 2019-08-06: 50 mg via INTRAVENOUS

## 2019-08-06 MED ORDER — FENTANYL CITRATE (PF) 100 MCG/2ML IJ SOLN
INTRAMUSCULAR | Status: AC
  Filled 2019-08-06: qty 2

## 2019-08-06 MED ORDER — FENTANYL CITRATE (PF) 100 MCG/2ML IJ SOLN
INTRAMUSCULAR | Status: DC | PRN
  Administered 2019-08-06: 50 ug via INTRAVENOUS
  Administered 2019-08-06 (×2): 100 ug via INTRAVENOUS

## 2019-08-06 MED ORDER — BUPIVACAINE HCL (PF) 0.25 % IJ SOLN
INTRAMUSCULAR | Status: AC
  Filled 2019-08-06: qty 30

## 2019-08-06 MED ORDER — LIDOCAINE 2% (20 MG/ML) 5 ML SYRINGE
INTRAMUSCULAR | Status: DC | PRN
  Administered 2019-08-06: 60 mg via INTRAVENOUS

## 2019-08-06 MED ORDER — PROPOFOL 10 MG/ML IV BOLUS
INTRAVENOUS | Status: AC
  Filled 2019-08-06: qty 20

## 2019-08-06 MED ORDER — ACETAMINOPHEN 500 MG PO TABS
ORAL_TABLET | ORAL | Status: AC
  Filled 2019-08-06: qty 2

## 2019-08-06 MED ORDER — DEXAMETHASONE SODIUM PHOSPHATE 10 MG/ML IJ SOLN
INTRAMUSCULAR | Status: DC | PRN
  Administered 2019-08-06: 5 mg via INTRAVENOUS

## 2019-08-06 SURGICAL SUPPLY — 75 items
BANDAGE ESMARK 6X9 LF (GAUZE/BANDAGES/DRESSINGS) IMPLANT
BLADE CLIPPER SURG (BLADE) ×1 IMPLANT
BNDG CMPR 9X6 STRL LF SNTH (GAUZE/BANDAGES/DRESSINGS) ×1
BNDG ELASTIC 4X5.8 VLCR STR LF (GAUZE/BANDAGES/DRESSINGS) ×2 IMPLANT
BNDG ELASTIC 6X5.8 VLCR STR LF (GAUZE/BANDAGES/DRESSINGS) ×2 IMPLANT
BNDG ESMARK 6X9 LF (GAUZE/BANDAGES/DRESSINGS) ×2
BNDG GAUZE ELAST 4 BULKY (GAUZE/BANDAGES/DRESSINGS) ×1 IMPLANT
BONE CANC CHIPS 40CC CAN1/2 (Bone Implant) ×4 IMPLANT
BRUSH SCRUB EZ  4% CHG (MISCELLANEOUS) ×2
BRUSH SCRUB EZ 4% CHG (MISCELLANEOUS) IMPLANT
BRUSH SCRUB EZ PLAIN DRY (MISCELLANEOUS) ×4 IMPLANT
CANISTER SUCT 3000ML PPV (MISCELLANEOUS) ×2 IMPLANT
CHIPS CANC BONE 40CC CAN1/2 (Bone Implant) ×2 IMPLANT
COVER SURGICAL LIGHT HANDLE (MISCELLANEOUS) ×2 IMPLANT
COVER WAND RF STERILE (DRAPES) ×1 IMPLANT
DRAPE C-ARM 42X72 X-RAY (DRAPES) ×2 IMPLANT
DRAPE C-ARMOR (DRAPES) ×2 IMPLANT
DRAPE IMP U-DRAPE 54X76 (DRAPES) ×1 IMPLANT
DRAPE ORTHO SPLIT 77X108 STRL (DRAPES) ×4
DRAPE SURG ORHT 6 SPLT 77X108 (DRAPES) ×3 IMPLANT
DRAPE U-SHAPE 47X51 STRL (DRAPES) ×2 IMPLANT
DRSG ADAPTIC 3X8 NADH LF (GAUZE/BANDAGES/DRESSINGS) ×1 IMPLANT
DRSG MEPILEX BORDER 4X8 (GAUZE/BANDAGES/DRESSINGS) ×1 IMPLANT
DRSG PAD ABDOMINAL 8X10 ST (GAUZE/BANDAGES/DRESSINGS) ×4 IMPLANT
ELECT REM PT RETURN 9FT ADLT (ELECTROSURGICAL) ×2
ELECTRODE REM PT RTRN 9FT ADLT (ELECTROSURGICAL) ×1 IMPLANT
EVACUATOR 1/8 PVC DRAIN (DRAIN) IMPLANT
EVACUATOR 3/16  PVC DRAIN (DRAIN)
EVACUATOR 3/16 PVC DRAIN (DRAIN) IMPLANT
GAUZE SPONGE 4X4 12PLY STRL (GAUZE/BANDAGES/DRESSINGS) ×1 IMPLANT
GLOVE BIO SURGEON STRL SZ7.5 (GLOVE) ×3 IMPLANT
GLOVE BIO SURGEON STRL SZ8 (GLOVE) ×3 IMPLANT
GLOVE BIOGEL PI IND STRL 7.5 (GLOVE) ×1 IMPLANT
GLOVE BIOGEL PI IND STRL 8 (GLOVE) ×1 IMPLANT
GLOVE BIOGEL PI INDICATOR 7.5 (GLOVE) ×1
GLOVE BIOGEL PI INDICATOR 8 (GLOVE) ×1
GOWN STRL REUS W/ TWL LRG LVL3 (GOWN DISPOSABLE) ×2 IMPLANT
GOWN STRL REUS W/ TWL XL LVL3 (GOWN DISPOSABLE) ×1 IMPLANT
GOWN STRL REUS W/TWL LRG LVL3 (GOWN DISPOSABLE) ×4
GOWN STRL REUS W/TWL XL LVL3 (GOWN DISPOSABLE) ×2
GRAFT BNE CHIP CANC 1-8 40 (Bone Implant) IMPLANT
KIT BASIN OR (CUSTOM PROCEDURE TRAY) ×2 IMPLANT
KIT INFUSE LRG II (Orthopedic Implant) ×1 IMPLANT
KIT TURNOVER KIT B (KITS) ×2 IMPLANT
NEEDLE 22X1 1/2 (OR ONLY) (NEEDLE) IMPLANT
NS IRRIG 1000ML POUR BTL (IV SOLUTION) ×2 IMPLANT
PACK TOTAL JOINT (CUSTOM PROCEDURE TRAY) ×2 IMPLANT
PACK UNIVERSAL I (CUSTOM PROCEDURE TRAY) ×1 IMPLANT
PAD ARMBOARD 7.5X6 YLW CONV (MISCELLANEOUS) ×4 IMPLANT
PAD CAST 4YDX4 CTTN HI CHSV (CAST SUPPLIES) ×1 IMPLANT
PADDING CAST ABS 6INX4YD NS (CAST SUPPLIES) ×1
PADDING CAST ABS COTTON 6X4 NS (CAST SUPPLIES) IMPLANT
PADDING CAST COTTON 4X4 STRL (CAST SUPPLIES) ×2
PADDING CAST COTTON 6X4 STRL (CAST SUPPLIES) ×1 IMPLANT
SOL PREP PROV IODINE SCRUB 4OZ (MISCELLANEOUS) ×1 IMPLANT
SOLUTION BETADINE 4OZ (MISCELLANEOUS) ×1 IMPLANT
SPONGE LAP 18X18 RF (DISPOSABLE) ×1 IMPLANT
STAPLER VISISTAT 35W (STAPLE) ×2 IMPLANT
SUCTION FRAZIER HANDLE 10FR (MISCELLANEOUS) ×2
SUCTION TUBE FRAZIER 10FR DISP (MISCELLANEOUS) ×1 IMPLANT
SUT ETHILON 2 0 FSLX (SUTURE) ×1 IMPLANT
SUT PDS AB 0 CT 36 (SUTURE) ×1 IMPLANT
SUT PROLENE 0 CT 2 (SUTURE) ×2 IMPLANT
SUT VIC AB 0 CT1 27 (SUTURE) ×2
SUT VIC AB 0 CT1 27XBRD ANBCTR (SUTURE) ×2 IMPLANT
SUT VIC AB 1 CT1 27 (SUTURE) ×2
SUT VIC AB 1 CT1 27XBRD ANBCTR (SUTURE) ×2 IMPLANT
SUT VIC AB 2-0 CT1 27 (SUTURE) ×2
SUT VIC AB 2-0 CT1 TAPERPNT 27 (SUTURE) ×2 IMPLANT
SWAB COLLECTION DEVICE MRSA (MISCELLANEOUS) ×1 IMPLANT
SYR 20ML ECCENTRIC (SYRINGE) IMPLANT
TOWEL GREEN STERILE (TOWEL DISPOSABLE) ×4 IMPLANT
TOWEL GREEN STERILE FF (TOWEL DISPOSABLE) ×1 IMPLANT
TRAY FOLEY MTR SLVR 16FR STAT (SET/KITS/TRAYS/PACK) ×1 IMPLANT
WATER STERILE IRR 1000ML POUR (IV SOLUTION) ×2 IMPLANT

## 2019-08-06 NOTE — Discharge Instructions (Addendum)
Orthopaedic Trauma Service Discharge Instructions   General Discharge Instructions   WEIGHT BEARING STATUS: Touchdown weightbearing left leg  RANGE OF MOTION/ACTIVITY: Unrestricted range of motion left knee and ankle.  Activity as tolerated while maintaining weightbearing restrictions   Wound Care: Daily wound care starting on 08/09/2019.  Please see below  Discharge Wound Care Instructions  Do NOT apply any ointments, solutions or lotions to pin sites or surgical wounds.  These prevent needed drainage and even though solutions like hydrogen peroxide kill bacteria, they also damage cells lining the pin sites that help fight infection.  Applying lotions or ointments can keep the wounds moist and can cause them to breakdown and open up as well. This can increase the risk for infection. When in doubt call the office.  Surgical incisions should be dressed daily.  If any drainage is noted, use one layer of adaptic, then gauze, Kerlix, and an ace wrap.  Once the incision is completely dry and without drainage, it may be left open to air out.  Showering may begin 36-48 hours later.  Cleaning gently with soap and water.  Traumatic wounds should be dressed daily as well.    One layer of adaptic, gauze, Kerlix, then ace wrap.  The adaptic can be discontinued once the draining has ceased    If you have a wet to dry dressing: wet the gauze with saline the squeeze as much saline out so the gauze is moist (not soaking wet), place moistened gauze over wound, then place a dry gauze over the moist one, followed by Kerlix wrap, then ace wrap.  DVT/PE prophylaxis: Remain as mobile as possible.  You do not require any pharmacologic prophylaxis  Diet: as you were eating previously.  Can use over the counter stool softeners and bowel preparations, such as Miralax, to help with bowel movements.  Narcotics can be constipating.  Be sure to drink plenty of fluids  PAIN MEDICATION USE AND EXPECTATIONS  You  have likely been given narcotic medications to help control your pain.  After a traumatic event that results in an fracture (broken bone) with or without surgery, it is ok to use narcotic pain medications to help control one's pain.  We understand that everyone responds to pain differently and each individual patient will be evaluated on a regular basis for the continued need for narcotic medications. Ideally, narcotic medication use should last no more than 6-8 weeks (coinciding with fracture healing).   As a patient it is your responsibility as well to monitor narcotic medication use and report the amount and frequency you use these medications when you come to your office visit.   We would also advise that if you are using narcotic medications, you should take a dose prior to therapy to maximize you participation.  IF YOU ARE ON NARCOTIC MEDICATIONS IT IS NOT PERMISSIBLE TO OPERATE A MOTOR VEHICLE (MOTORCYCLE/CAR/TRUCK/MOPED) OR HEAVY MACHINERY DO NOT MIX NARCOTICS WITH OTHER CNS (CENTRAL NERVOUS SYSTEM) DEPRESSANTS SUCH AS ALCOHOL   STOP SMOKING OR USING NICOTINE PRODUCTS!!!!  As discussed nicotine severely impairs your body's ability to heal surgical and traumatic wounds but also impairs bone healing.  Wounds and bone heal by forming microscopic blood vessels (angiogenesis) and nicotine is a vasoconstrictor (essentially, shrinks blood vessels).  Therefore, if vasoconstriction occurs to these microscopic blood vessels they essentially disappear and are unable to deliver necessary nutrients to the healing tissue.  This is one modifiable factor that you can do to dramatically increase your chances of  healing your injury.    (This means no smoking, no nicotine gum, patches, etc)  DO NOT USE NONSTEROIDAL ANTI-INFLAMMATORY DRUGS (NSAID'S)  Using products such as Advil (ibuprofen), Aleve (naproxen), Motrin (ibuprofen) for additional pain control during fracture healing can delay and/or prevent the healing  response.  If you would like to take over the counter (OTC) medication, Tylenol (acetaminophen) is ok.  However, some narcotic medications that are given for pain control contain acetaminophen as well. Therefore, you should not exceed more than 4000 mg of tylenol in a day if you do not have liver disease.  Also note that there are may OTC medicines, such as cold medicines and allergy medicines that my contain tylenol as well.  If you have any questions about medications and/or interactions please ask your doctor/PA or your pharmacist.      ICE AND ELEVATE INJURED/OPERATIVE EXTREMITY  Using ice and elevating the injured extremity above your heart can help with swelling and pain control.  Icing in a pulsatile fashion, such as 20 minutes on and 20 minutes off, can be followed.    Do not place ice directly on skin. Make sure there is a barrier between to skin and the ice pack.    Using frozen items such as frozen peas works well as the conform nicely to the are that needs to be iced.  USE AN ACE WRAP OR TED HOSE FOR SWELLING CONTROL  In addition to icing and elevation, Ace wraps or TED hose are used to help limit and resolve swelling.  It is recommended to use Ace wraps or TED hose until you are informed to stop.    When using Ace Wraps start the wrapping distally (farthest away from the body) and wrap proximally (closer to the body)   Example: If you had surgery on your leg or thing and you do not have a splint on, start the ace wrap at the toes and work your way up to the thigh        If you had surgery on your upper extremity and do not have a splint on, start the ace wrap at your fingers and work your way up to the upper arm  IF YOU ARE IN A SPLINT OR CAST DO NOT [place]   If your splint gets wet for any reason please contact the office immediately. You may shower in your splint or cast as long as you keep it dry.  This can be done by wrapping in a cast cover or garbage back (or  similar)  Do Not stick any thing down your splint or cast such as pencils, money, or hangers to try and scratch yourself with.  If you feel itchy take benadryl as prescribed on the bottle for itching  IF YOU ARE IN A CAM BOOT (BLACK BOOT)  You may remove boot periodically. Perform daily dressing changes as noted below.  Wash the liner of the boot regularly and wear a sock when wearing the boot. It is recommended that you sleep in the boot until told otherwise    Call office for the following:  Temperature greater than 101F  Persistent nausea and vomiting  Severe uncontrolled pain  Redness, tenderness, or signs of infection (pain, swelling, redness, odor or green/yellow discharge around the site)  Difficulty breathing, headache or visual disturbances  Hives  Persistent dizziness or light-headedness  Extreme fatigue  Any other questions or concerns you may have after discharge  In an emergency, call  911 or go to an Emergency Department at a nearby hospital    [place]: [phone]   VISIT OUR WEBSITE FOR ADDITIONAL INFORMATION: [email or web address]

## 2019-08-06 NOTE — Anesthesia Procedure Notes (Signed)
Procedure Name: Intubation Date/Time: 08/06/2019 8:52 AM Performed by: Kathryne Hitch, CRNA Pre-anesthesia Checklist: Patient identified, Emergency Drugs available, Suction available and Patient being monitored Patient Re-evaluated:Patient Re-evaluated prior to induction Oxygen Delivery Method: Circle system utilized Preoxygenation: Pre-oxygenation with 100% oxygen Induction Type: IV induction Ventilation: Oral airway inserted - appropriate to patient size Laryngoscope Size: Mac and 4 Grade View: Grade II Tube type: Oral Tube size: 7.5 mm Number of attempts: 1 Airway Equipment and Method: Stylet and Oral airway Placement Confirmation: ETT inserted through vocal cords under direct vision,  positive ETCO2 and breath sounds checked- equal and bilateral Secured at: 23 cm Tube secured with: Tape Dental Injury: Teeth and Oropharynx as per pre-operative assessment

## 2019-08-06 NOTE — Brief Op Note (Signed)
08/06/2019  11:13 AM  PATIENT:  [name]  74 y.o. male  PRE-OPERATIVE DIAGNOSIS:  NONUNION LEFT DISTAL FEMUR  POST-OPERATIVE DIAGNOSIS:  NONUNION LEFT DISTAL FEMUR  PROCEDURE:  Procedure(s): REPAIR DISTAL FEMUR NONUNION WITH INFUSE AND ALLOGRAFT (Left)  SURGEON:  Surgeon(s) and Role:    Altamese [place], MD - Primary  ASSISTANTS: none   ANESTHESIA:   general  EBL:  25 mL   BLOOD ADMINISTERED:none  DRAINS: none   LOCAL MEDICATIONS USED:  NONE  SPECIMEN:  Source of Specimen:  nonunion site left distal femur  DISPOSITION OF SPECIMEN:  micro  COUNTS:  YES  TOURNIQUET:   Total Tourniquet Time Documented: Thigh (Left) - 45 minutes Total: Thigh (Left) - 45 minutes   DICTATION: .Other Dictation: Dictation Number [id number]  PLAN OF CARE: Discharge to home after PACU  PATIENT DISPOSITION:  PACU - hemodynamically stable.   Delay start of Pharmacological VTE agent (>24hrs) due to surgical blood loss or risk of bleeding: no

## 2019-08-06 NOTE — Anesthesia Postprocedure Evaluation (Signed)
Anesthesia Post Note  Patient: [name]  Procedure(s) Performed: REPAIR DISTAL FEMUR NONUNION WITH INFUSE AND ALLOGRAFT (Left )     Patient location during evaluation: PACU Anesthesia Type: General Level of consciousness: awake and alert Pain management: pain level controlled Vital Signs Assessment: post-procedure vital signs reviewed and stable Respiratory status: spontaneous breathing, nonlabored ventilation and respiratory function stable Cardiovascular status: blood pressure returned to baseline and stable Postop Assessment: no apparent nausea or vomiting Anesthetic complications: no    Last Vitals:  Vitals:   08/06/19 1055 08/06/19 1112  BP: (!) 153/77 (!) 147/80  Pulse: 75 83  Resp: 13 12  Temp:    SpO2: 94% 95%    Last Pain:  Vitals:   08/06/19 1112  TempSrc:   PainSc: 9     LLE Motor Response: Purposeful movement (08/06/19 1112) LLE Sensation: Full sensation (08/06/19 1112)          Shaquetta Arcos,W. EDMOND

## 2019-08-06 NOTE — Transfer of Care (Signed)
Immediate Anesthesia Transfer of Care Note  Patient: [name]  Procedure(s) Performed: REPAIR DISTAL FEMUR NONUNION WITH INFUSE AND ALLOGRAFT (Left )  Patient Location: PACU  Anesthesia Type:General  Level of Consciousness: awake and alert   Airway & Oxygen Therapy: Patient Spontanous Breathing  Post-op Assessment: Report given to RN and Post -op Vital signs reviewed and stable  Post vital signs: Reviewed and stable  Last Vitals:  Vitals Value Taken Time  BP 174/71 08/06/19 1040  Temp    Pulse 81 08/06/19 1046  Resp 17 08/06/19 1046  SpO2 93 % 08/06/19 1046  Vitals shown include unvalidated device data.  Last Pain:  Vitals:   08/06/19 0631  TempSrc:   PainSc: 0-No pain      Patients Stated Pain Goal: 4 ([id number])  Complications: No apparent anesthesia complications

## 2019-08-06 NOTE — Op Note (Signed)
NAME: Richard, [name] MEDICAL RECORD [id number] ACCOUNT [id number] DATE OF BIRTH:12-31-1945 FACILITY: [facility] LOCATION: [facility] PHYSICIAN:Johnthan Axtman H. Anita Mcadory, MD  OPERATIVE REPORT  DATE OF PROCEDURE:  08/06/2019  PREOPERATIVE DIAGNOSIS:  Left distal femur nonunion.  POSTOPERATIVE DIAGNOSIS:  Left distal femur nonunion.  PROCEDURE:   Repair of left distal femur nonunion using Infuse and allograft through a medial approach.  SURGEON:  Altamese [place], MD  ASSISTANT:  None.  ANESTHESIA:  General.  COMPLICATIONS:  None.  TOTAL TOURNIQUET TIME:  45 minutes.  ESTIMATED BLOOD LOSS:  25 mL.  SPECIMENS:  Anaerobic, aerobic bone and tissue sent for culture.  COMPLICATIONS:  None.  DISPOSITION:  To PACU.  CONDITION:  Stable.  INDICATIONS FOR PROCEDURE:  The patient is a 74 year old male who sustained a supracondylar femur fracture above a total knee implant.  He underwent ORIF with the Biomet NCB plate.  The patient continued to have pain at the fracture site and CT scan  demonstrated a large cavitary defect which had developed in the area.  Furthermore, it showed opening into the medial side with potentially some healing bone along the anterior and lateral aspect of the distal femur anterior to the plate.  I did discuss  with the patient the risks and benefits of surgical repair of the nonunion, including the possibility of exchange of hardware, but it has only been 4 months.  Everything was in good position without loosening.  We did not deem this necessary or  appropriate at this time, rather isolated repair using a medial approach given the CT scan findings.  We did discuss other graft options such as iliac crest or reamed intramedullary aspiration.  After acknowledging these risks, the patient provided  consent to proceed.  BRIEF SUMMARY OF PROCEDURE:  The patient was taken to the operating room after administration of Ancef.  His left lower extremity was prepped and draped in  the usual sterile fashion with chlorhexidine wash, Betadine scrub and paint following induction of  general anesthesia.  A standard prep and drape was then performed, a time-out held, a C-arm brought in to identify the most appropriate position for the incision.  This was made through 5 cm, carrying dissection through the fascia, elevating the VMO,  being very careful not to devascularize any areas of bone or blood supply other than immediately at the nonunion site, which was identified using a 15 blade.  This was followed by use of a 10 blade and then serially use of the curettes to completely  debride the cavity.  Prior to debridement of the cavity, I did elevate the leg and exsanguinated with an Esmarch bandage and then elevated the tourniquet to 300 mmHg.  The entirety of the cavity was scraped clean, then irrigated thoroughly.  I then  packed a total of 60 mL of cancellous chips into the cavity in addition to some Infuse sponges using the burrito or lasagna, technique laying it in so as to bridge the fracture gap and promote healing in a timely fashion.  C-arm images were used to  confirm placement and fill of the fracture gap.  Layered closure was performed on exit using 0 PDS and then a 2-0 Vicryl and 2-0 nylon.  Sterile gently compressive dressing was applied from foot to thigh.  The patient was awakened from anesthesia and  transported to the PACU in stable condition.  PROGNOSIS:  The patient will continue to be weightbearing as tolerated on the left lower extremity.  We will plan to see him  back for removal of sutures in 2 weeks.  His hemoglobin A1c showed a higher number, consistent with poor control as it is now  over 9 and this has been conveyed to his wife along with the strong urging to maintain tight sugar control to promote healing and reduce the chance of infection.  Because of his diabetes, he remains at elevated risk of both.  VN/NUANCE  D:08/06/2019 T:08/06/2019 JOB:[id number]

## 2019-08-06 NOTE — Anesthesia Preprocedure Evaluation (Addendum)
Anesthesia Evaluation  Patient identified by MRN, date of birth, ID band Patient awake    Reviewed: Allergy & Precautions, H&P , NPO status , Patient's Chart, lab work & pertinent test results  Airway Mallampati: II  TM Distance: >3 FB Neck ROM: Full    Dental no notable dental hx. (+) Edentulous Upper, Edentulous Lower, Dental Advisory Given   Pulmonary asthma , sleep apnea and Continuous Positive Airway Pressure Ventilation , former smoker,    Pulmonary exam normal breath sounds clear to auscultation       Cardiovascular hypertension, Pt. on medications and Pt. on home beta blockers + CAD, + Past MI and + Cardiac Stents   Rhythm:Regular Rate:Normal     Neuro/Psych negative neurological ROS  negative psych ROS   GI/Hepatic Neg liver ROS, GERD  Medicated,  Endo/Other  negative endocrine ROSdiabetes, Type 2, Oral Hypoglycemic Agents  Renal/GU negative Renal ROS  negative genitourinary   Musculoskeletal   Abdominal   Peds  Hematology negative hematology ROS (+)   Anesthesia Other Findings   Reproductive/Obstetrics negative OB ROS                            Anesthesia Physical Anesthesia Plan  ASA: III  Anesthesia Plan: General   Post-op Pain Management:    Induction: Intravenous  PONV Risk Score and Plan: 3 and Ondansetron, Dexamethasone and Midazolam  Airway Management Planned: Oral ETT  Additional Equipment:   Intra-op Plan:   Post-operative Plan: Extubation in OR  Informed Consent: I have reviewed the patients History and Physical, chart, labs and discussed the procedure including the risks, benefits and alternatives for the proposed anesthesia with the patient or authorized representative who has indicated his/her understanding and acceptance.     Dental advisory given  Plan Discussed with: CRNA  Anesthesia Plan Comments:         Anesthesia Quick Evaluation

## 2019-08-11 LAB — AEROBIC/ANAEROBIC CULTURE W GRAM STAIN (SURGICAL/DEEP WOUND): Culture: NO GROWTH

## 2019-08-12 LAB — NICOTINE/COTININE METABOLITES
Cotinine: <1 ng/mL
Nicotine: <1 ng/mL

## 2019-10-29 ENCOUNTER — Ambulatory Visit: Payer: Medicare Other | Admitting: Cardiology

## 2019-11-13 ENCOUNTER — Encounter: Payer: Self-pay | Admitting: *Deleted

## 2019-11-13 ENCOUNTER — Ambulatory Visit: Payer: Medicare Other | Admitting: Cardiology

## 2019-11-13 ENCOUNTER — Encounter: Payer: Self-pay | Admitting: Cardiology

## 2019-11-13 VITALS — BP 120/60 | HR 70 | Ht 70.0 in | Wt 233.6 lb

## 2019-11-13 DIAGNOSIS — I2510 Atherosclerotic heart disease of native coronary artery without angina pectoris: Secondary | ICD-10-CM

## 2019-11-13 DIAGNOSIS — I10 Essential (primary) hypertension: Secondary | ICD-10-CM

## 2019-11-13 DIAGNOSIS — E782 Mixed hyperlipidemia: Secondary | ICD-10-CM | POA: Diagnosis not present

## 2019-11-13 DIAGNOSIS — R600 Localized edema: Secondary | ICD-10-CM | POA: Diagnosis not present

## 2019-11-13 NOTE — Progress Notes (Signed)
Clinical Summary Mr. Bovenzi is a 74 y.o.male seen today for follow up of the following medical problems.  1. CAD  - hx of BMS to LAD in 2003  - cath 01/2011 with LM patent, LAD with prox stent mild ISR, patent mid stent, 90% mid LAD lesion. LCX 30% prox, OM2 30-40%, RCA small non-dom with no disease. Received additional stent to LAD at that time, a septal Arabel Barcenas was jailed. LVEF 65% by LV gram.  - exercise cardiolite from 08/2013 from prior cardiologist. 6 minutes with no ischemic changes, 87%THR, LVEF 58%, moderate size mild intensity partially reversible inferior defect thought to be soft tissue attenuation but cannot rule out RCA ischemia -[name] admit with chest pain 04/2014. Sharp pain mid to left chest, 10/10. No other associated symptoms. Not positional. Better with NG. Symptoms would last just a few minutes at at time. Lexiscan MPI during admission with no ischemia, LVEF 60%. 05/2014 cath at [facility] patent coronaries -01/2017 echo: LVEF 93-23%, grade I diastolic dysfunction   55/7322 nuclear stress: probable normal study, cannot exclude small distal inferior ischemia. Low risk   - no recent chest pains. Resolved with higher imdur dose - compliant with meds   2. LE swelling - ongoing swelling at times, resolves at night. Wears compressoin stockings. - has had prior leg surgeries - swelling overall stable    3. HTN -he is compliant with meds   4. Hyperlipidemia -he is compliant with statin   5. COPD - night time oxygen.  - followed by Dr Clydene Pugh  6. Femur fracture - admitted 03/2019 after fall - s/p surgery    Past Medical History:  Diagnosis Date  . Asthma   . Coronary atherosclerosis of native coronary artery  04/22/2001      Cardiac Catheterization  . Diverticulitis   . Encounter for long-term (current) use of insulin (IXL)   . Esophageal reflux   . Gout, unspecified   . Old myocardial infarction   . Personal history of  tobacco use, presenting hazards to health   . Postsurgical percutaneous transluminal coronary angioplasty status   . Pure hypercholesterolemia   . Sleep apnea   . 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 Unspecified essential hypertension      Allergies  Allergen Reactions  . Lisinopril Cough     Current Outpatient Medications  Medication Sig Dispense Refill  . acarbose (PRECOSE) 100 MG tablet Take 100 mg by mouth 3 (three) times daily.    Marland Kitchen allopurinol (ZYLOPRIM) 300 MG tablet Take 300 mg by mouth daily.     Marland Kitchen amLODipine (NORVASC) 10 MG tablet Take 1 tablet (10 mg total) by mouth daily. (Patient taking differently: Take 10 mg by mouth at bedtime. ) 30 tablet 6  . aspirin EC 81 MG tablet Take 81 mg by mouth daily.     Marland Kitchen atorvastatin (LIPITOR) 80 MG tablet TAKE 1 TABLET (80 MG TOTAL) BY MOUTH DAILY. (STOP SIMVASTATIN) (Patient taking differently: Take 80 mg by mouth daily. ) 30 tablet 6  . azithromycin (ZITHROMAX) 250 MG tablet Take 250 mg by mouth every Monday, Wednesday, and Friday.    . budesonide-formoterol (SYMBICORT) 160-4.5 MCG/ACT inhaler Inhale 2 puffs into the lungs 2 (two) times daily.    . carvedilol (COREG) 25 MG tablet Take 25 mg by mouth 2 (two) times daily with a meal.      . cholecalciferol (VITAMIN D)  25 MCG tablet Take 2 tablets (2,000 Units total) by mouth daily. 60 tablet 2  . gabapentin (NEURONTIN) 300 MG capsule Take 600 mg by mouth at bedtime.     Marland Kitchen HYDROcodone-acetaminophen (NORCO) 7.5-325 MG tablet Take 1-2 tablets by mouth every 6 (six) hours as needed for moderate pain or severe pain. 50 tablet 0  . ipratropium (ATROVENT) 0.03 % nasal spray Place 2 sprays into both nostrils See admin instructions. Use 2 sprays in each nostril every morning, may also use 2 sprays in each nostril at night as needed for congestion    . isosorbide mononitrate (IMDUR) 30 MG 24 hr tablet Take 1.5 tablets  (45 mg total) by mouth daily. 135 tablet 1  . Liraglutide (VICTOZA) 18 MG/3ML SOPN Inject 1.8 mg into the skin every morning.     . metFORMIN (GLUCOPHAGE-XR) 500 MG 24 hr tablet Take 500 mg by mouth 2 (two) times daily.    . montelukast (SINGULAIR) 10 MG tablet Take 10 mg by mouth 2 (two) times daily.     . Multiple Vitamin (MULTIVITAMIN WITH MINERALS) TABS tablet Take 1 tablet by mouth daily.     . Naphazoline HCl (CLEAR EYES OP) Place 1 drop into both eyes daily. Clear eyes conplete    . nitroGLYCERIN (NITROSTAT) 0.4 MG SL tablet Place 1 tablet (0.4 mg total) under the tongue every 5 (five) minutes as needed for chest pain. 25 tablet 3  . ondansetron (ZOFRAN ODT) 4 MG disintegrating tablet Take 1 tablet (4 mg total) by mouth every 8 (eight) hours as needed for nausea or vomiting. 20 tablet 0  . OXYGEN Inhale 2 L into the lungs at bedtime as needed (Copd).    . pantoprazole (PROTONIX) 40 MG tablet Take 40 mg by mouth daily.     . potassium chloride (MICRO-K) 10 MEQ CR capsule Take 10 mEq by mouth daily.    Marland Kitchen PRESCRIPTION MEDICATION Inhale into the lungs at bedtime. CPAP    . torsemide (DEMADEX) 20 MG tablet Take 2 tablets (40 mg total) by mouth daily. (Patient taking differently: Take 30 mg by mouth 2 (two) times daily. ) 180 tablet 3  . vitamin C (VITAMIN C) 1000 MG tablet Take 1 tablet (1,000 mg total) by mouth daily. 30 tablet 0  . zolpidem (AMBIEN) 10 MG tablet Take 10 mg by mouth at bedtime.      No current facility-administered medications for this visit.     Past Surgical History:  Procedure Laterality Date  . 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 CARDIAC CATHETERIZATION  2003   70% proximal LAD (plaque rupture), 40 % mid. Left dominent  . CARDIAC CATHETERIZATION  01/26/2011   LAD: Patent proximal stent. 90% calcified eccentic stenosis in med segment, mild LCX disease  . CAROTID STENTS    . CORONARY ANGIOPLASTY WITH STENT PLACEMENT  2003   Prox LAD:  3.0 X12 BMS  . CORONARY ANGIOPLASTY WITH STENT PLACEMENT  01/26/2011   Mid LAD: 3.0 X15 mm Vision BMS. Complicated by a jailed septal Crispin Vogel and periprocedural MI  . HEMORRHOID SURGERY    . HERNIA REPAIR    . ORIF FEMUR FRACTURE Left 03/19/2019   Procedure: OPEN REDUCTION INTERNAL FIXATION (ORIF) DISTAL FEMUR FRACTURE;  Surgeon: Altamese [place], MD;  Location: [facility];  Service: Orthopedics;  Laterality: Left;  . ORIF FEMUR FRACTURE Left 08/06/2019   Procedure: REPAIR DISTAL FEMUR NONUNION WITH INFUSE AND ALLOGRAFT;  Surgeon: Altamese [place],  MD;  Location: [facility];  Service: Orthopedics;  Laterality: Left;     Allergies  Allergen Reactions  . Lisinopril Cough      Family History  Problem Relation Age of Onset  . Coronary artery disease Other        Family History     Social History Mr. Beedle reports that he quit smoking about 34 years ago. His smoking use included cigarettes. He started smoking about 60 years ago. He has a 39.00 pack-year smoking history. He quit smokeless tobacco use about 34 years ago.  His smokeless tobacco use included snuff and chew. Mr. Goodley reports current alcohol use.   Review of Systems CONSTITUTIONAL: No weight loss, fever, chills, weakness or fatigue.  HEENT: Eyes: No visual loss, blurred vision, double vision or yellow sclerae.No hearing loss, sneezing, congestion, runny nose or sore throat.  SKIN: No rash or itching.  CARDIOVASCULAR: per hpi RESPIRATORY: No shortness of breath, cough or sputum.  GASTROINTESTINAL: No anorexia, nausea, vomiting or diarrhea. No abdominal pain or blood.  GENITOURINARY: No burning on urination, no polyuria NEUROLOGICAL: No headache, dizziness, syncope, paralysis, ataxia, numbness or tingling in the extremities. No change in bowel or bladder control.  MUSCULOSKELETAL: No muscle, back pain, joint pain or stiffness.  LYMPHATICS: No enlarged nodes. No history of splenectomy.  PSYCHIATRIC: No history of depression or  anxiety.  ENDOCRINOLOGIC: No reports of sweating, cold or heat intolerance. No polyuria or polydipsia.  Marland Kitchen   Physical Examination There were no vitals filed for this visit. Filed Weights   11/13/19 0958  Weight: (!) 233 lb 9.6 oz (106 kg)    Gen: resting comfortably, no acute distress HEENT: no scleral icterus, pupils equal round and reactive, no palptable cervical adenopathy,  CV: RRR, no m/r/g, no jvd Resp: Clear to auscultation bilaterally GI: abdomen is soft, non-tender, non-distended, normal bowel sounds, no hepatosplenomegaly MSK: extremities are warm, trace bilateral edema Skin: warm, no rash Neuro:  no focal deficits Psych: appropriate affect      Assessment and Plan  1. CAD - symptoms resolved with prior increase in imdur, continue to monitor at this time.   2. LE edema/SOB -priorEcho with normal LVEF, grade I diasotlic dysfunction - overall stable, continue current diuretic.   3. HTN - at goal, continue current meds  4. Hyperlipidemia - request pcp labs, continue statin.       Arnoldo Lenis, M.D.

## 2019-11-13 NOTE — Patient Instructions (Addendum)
Medication Instructions:   Your physician recommends that you continue on your current medications as directed. Please refer to the Current Medication list given to you today.  Labwork:  NONE  Testing/Procedures:  NONE  Follow-Up:  Your physician recommends that you schedule a follow-up appointment in: 4 months.  Any Other Special Instructions Will Be Listed Below (If Applicable).  If you need a refill on your cardiac medications before your next appointment, please call your pharmacy. 

## 2019-11-29 ENCOUNTER — Other Ambulatory Visit (HOSPITAL_COMMUNITY): Payer: Self-pay | Admitting: Orthopedic Surgery

## 2019-11-29 DIAGNOSIS — S72452N Displaced supracondylar fracture without intracondylar extension of lower end of left femur, subsequent encounter for open fracture type IIIA, IIIB, or IIIC with nonunion: Secondary | ICD-10-CM

## 2019-12-11 ENCOUNTER — Ambulatory Visit (HOSPITAL_COMMUNITY)
Admission: RE | Admit: 2019-12-11 | Discharge: 2019-12-11 | Disposition: A | Discharge status: Home / Self Care | Payer: Medicare Other | Source: Ambulatory Visit | Attending: Orthopedic Surgery | Admitting: Orthopedic Surgery

## 2019-12-11 ENCOUNTER — Other Ambulatory Visit: Payer: Self-pay

## 2019-12-11 DIAGNOSIS — S72452N Displaced supracondylar fracture without intracondylar extension of lower end of left femur, subsequent encounter for open fracture type IIIA, IIIB, or IIIC with nonunion: Secondary | ICD-10-CM | POA: Insufficient documentation

## 2019-12-25 ENCOUNTER — Ambulatory Visit (HOSPITAL_COMMUNITY): Payer: Medicare Other

## 2020-01-21 ENCOUNTER — Other Ambulatory Visit: Payer: Self-pay | Admitting: Cardiology

## 2020-02-20 ENCOUNTER — Other Ambulatory Visit (HOSPITAL_COMMUNITY): Payer: Self-pay | Admitting: Orthopedic Surgery

## 2020-02-20 DIAGNOSIS — S72452N Displaced supracondylar fracture without intracondylar extension of lower end of left femur, subsequent encounter for open fracture type IIIA, IIIB, or IIIC with nonunion: Secondary | ICD-10-CM

## 2020-02-24 ENCOUNTER — Telehealth: Payer: Self-pay | Admitting: Cardiology

## 2020-02-24 DIAGNOSIS — I2510 Atherosclerotic heart disease of native coronary artery without angina pectoris: Secondary | ICD-10-CM

## 2020-02-24 NOTE — Telephone Encounter (Addendum)
I will clear through Dr.Branch    Dr.Branch approved , order placed. [facility] office staff will schedule patient.

## 2020-02-24 NOTE — Telephone Encounter (Signed)
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

## 2020-02-24 NOTE — Telephone Encounter (Signed)
Can order lexiscan for history of CAD   J Katai Marsico MD

## 2020-02-28 ENCOUNTER — Other Ambulatory Visit: Payer: Self-pay

## 2020-02-28 ENCOUNTER — Encounter (HOSPITAL_COMMUNITY): Payer: Self-pay

## 2020-02-28 ENCOUNTER — Encounter (HOSPITAL_COMMUNITY)
Admission: RE | Admit: 2020-02-28 | Discharge: 2020-02-28 | Disposition: A | Discharge status: Home / Self Care | Payer: Medicare Other | Source: Ambulatory Visit | Attending: Cardiology | Admitting: Cardiology

## 2020-02-28 ENCOUNTER — Ambulatory Visit (HOSPITAL_COMMUNITY)
Admission: RE | Admit: 2020-02-28 | Discharge: 2020-02-28 | Disposition: A | Discharge status: Home / Self Care | Payer: Medicare Other | Source: Ambulatory Visit | Attending: Cardiology | Admitting: Cardiology

## 2020-02-28 DIAGNOSIS — I2510 Atherosclerotic heart disease of native coronary artery without angina pectoris: Secondary | ICD-10-CM | POA: Insufficient documentation

## 2020-02-28 LAB — NM MYOCAR MULTI W/SPECT W/WALL MOTION / EF
LV dias vol: 134 mL (ref 62–150)
LV sys vol: 49 mL
Peak HR: 75 {beats}/min
RATE: 0.27
Rest HR: 66 {beats}/min
SDS: 1
SRS: 1
SSS: 2
TID: 1.06

## 2020-02-28 MED ORDER — TECHNETIUM TC 99M TETROFOSMIN IV KIT
10.0000 | PACK | Freq: Once | INTRAVENOUS | Status: AC | PRN
  Administered 2020-02-28: 10.3 via INTRAVENOUS

## 2020-02-28 MED ORDER — SODIUM CHLORIDE FLUSH 0.9 % IV SOLN
INTRAVENOUS | Status: AC
  Administered 2020-02-28: 10 mL via INTRAVENOUS
  Filled 2020-02-28: qty 10

## 2020-02-28 MED ORDER — REGADENOSON 0.4 MG/5ML IV SOLN
INTRAVENOUS | Status: AC
  Administered 2020-02-28: 0.4 mg via INTRAVENOUS
  Filled 2020-02-28: qty 5

## 2020-02-28 MED ORDER — TECHNETIUM TC 99M TETROFOSMIN IV KIT
30.0000 | PACK | Freq: Once | INTRAVENOUS | Status: AC | PRN
  Administered 2020-02-28: 31 via INTRAVENOUS

## 2020-03-01 NOTE — Progress Notes (Signed)
Cardiology Clinic Note   Patient Name: [name] Date of Encounter: 03/02/2020  Primary Care Provider:  Tobe Sos, MD Primary Cardiologist:  Carlyle Dolly, MD  Patient Profile    Richard Eriksson. [place] 74 year old male presents the clinic today for follow-up of his CAD.  Past Medical History    Past Medical History:  Diagnosis Date   Asthma    Coronary atherosclerosis of native coronary artery  04/22/2001      Cardiac Catheterization   Diverticulitis    Encounter for long-term (current) use of insulin ([place])    Esophageal reflux    Gout, unspecified    Old myocardial infarction    Personal history of tobacco use, presenting hazards to health    Postsurgical percutaneous transluminal coronary angioplasty status    Pure hypercholesterolemia    Sleep apnea    Type II or unspecified type diabetes mellitus without mention of complication, not stated as uncontrolled    TYPE 11   Unspecified essential hypertension    Past Surgical History:  Procedure Laterality Date   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 Left dominent   CARDIAC CATHETERIZATION  01/26/2011   LAD: Patent proximal stent. 90% calcified eccentic stenosis in med segment, mild LCX disease   CAROTID STENTS     CORONARY ANGIOPLASTY WITH STENT PLACEMENT  2003   Prox LAD: 3.0 X12 BMS   CORONARY ANGIOPLASTY WITH STENT PLACEMENT  01/26/2011   Mid LAD: 3.0 X15 mm Vision BMS. Complicated by a jailed septal branch and periprocedural MI   HEMORRHOID SURGERY     HERNIA REPAIR     ORIF FEMUR FRACTURE Left 03/19/2019   Procedure: OPEN REDUCTION INTERNAL FIXATION (ORIF) DISTAL FEMUR FRACTURE;  Surgeon: Altamese Espino, MD;  Location: [facility];  Service: Orthopedics;  Laterality: Left;   ORIF FEMUR FRACTURE Left 08/06/2019   Procedure: REPAIR DISTAL FEMUR NONUNION WITH INFUSE AND ALLOGRAFT;   Surgeon: Altamese Rendon, MD;  Location: [facility];  Service: Orthopedics;  Laterality: Left;    Allergies  Allergies  Allergen Reactions   Lisinopril Cough    History of Present Illness    [name] has a PMH of coronary atherosclerosis, essential hypertension, diabetes, and pure hypercholesterolemia.  Cardiac catheterization with BMS to LAD 2003, underwent cardiac catheterization 10/12 left main patent, LAD with proximal stent mild ISR, patent mid stent, 90% mid LAD lesion, LCx 30% proximal, OM 2 30-40%, RCA small nondominant with no disease.  Received additional stent to LAD, septal branch was jailed at that time LVEF 65%.  He was admitted to [facility] with chest pain 1/16.  Symptoms improved with sublingual nitroglycerin.  Lexiscan during mission showed no ischemia LVEF 60%.  Cardiac catheterization 2/16 at [facility] showed patent coronary anatomy.  10/18 echocardiogram LVEF 55-60%, G1 DD.  He was last seen by Dr. Harl Bowie on 11/05/2019.  He felt well at that time.  His chest discomfort symptoms had resolved with increased Imdur.  His lower extremity edema and dyspnea on exertion are stable.  His diuretic was continued.  His hypertension was well controlled and he reported compliance with statin medication.  His labs were requested by his PCP.  His nuclear stress test 02/28/2020 showed no ischemia, low risk and an EF of 63%.  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  He states he feels well.  He has no cardiac complaints at this time. He states that he remains active cutting down and removing trees as well as driving. When asked about his diet he states he eats to much and likes fat back now and then.  He is tolerating his medications well without side effects.  I will give him the salty 6 diet sheet, have him continue his physical activity, and have him follow-up in 6 months.  Today he denies chest pain, shortness of breath, lower extremity edema, fatigue,  palpitations, melena, hematuria, hemoptysis, diaphoresis, weakness, presyncope, syncope, orthopnea, and PND.   Home Medications    Prior to Admission medications   Medication Sig Start Date End Date Taking? Authorizing Provider  acarbose (PRECOSE) 100 MG tablet Take 100 mg by mouth 3 (three) times daily. 02/14/19   [provider]  allopurinol (ZYLOPRIM) 300 MG tablet Take 300 mg by mouth daily.     [provider]  amLODipine (NORVASC) 10 MG tablet Take 1 tablet (10 mg total) by mouth daily. Patient taking differently: Take 10 mg by mouth at bedtime.  12/10/13   Arnoldo Lenis, MD  aspirin EC 81 MG tablet Take 81 mg by mouth daily.     [provider]  atorvastatin (LIPITOR) 80 MG tablet TAKE 1 TABLET (80 MG TOTAL) BY MOUTH DAILY. (STOP SIMVASTATIN) Patient taking differently: Take 80 mg by mouth daily.  08/08/14   Arnoldo Lenis, MD  azithromycin (ZITHROMAX) 250 MG tablet Take 250 mg by mouth every Monday, Wednesday, and Friday. 02/28/19   [provider]  budesonide-formoterol (SYMBICORT) 160-4.5 MCG/ACT inhaler Inhale 2 puffs into the lungs 2 (two) times daily.    [provider]  carvedilol (COREG) 25 MG tablet Take 25 mg by mouth 2 (two) times daily with a meal.      [provider]  cholecalciferol (VITAMIN D) 25 MCG tablet Take 2 tablets (2,000 Units total) by mouth daily. 03/23/19   Ainsley Spinner, PA-C  gabapentin (NEURONTIN) 300 MG capsule Take 600 mg by mouth at bedtime.     [provider]  HYDROcodone-acetaminophen (NORCO/VICODIN) 5-325 MG tablet Take 1-2 tablets by mouth 2 (two) times daily as needed. 08/28/19   [provider]  ipratropium (ATROVENT) 0.03 % nasal spray Place 2 sprays into both nostrils See admin instructions. Use 2 sprays in each nostril every morning, may also use 2 sprays in each nostril at night as needed for congestion 02/01/19   [provider]  isosorbide mononitrate (IMDUR)  30 MG 24 hr tablet TAKE 1 & 1/2 TABLETS (45MG ) BY MOUTH ONCE DAILY **DOSE CHANGE** 01/21/20   Arnoldo Lenis, MD  levocetirizine (XYZAL) 5 MG tablet Take 1 tablet by mouth daily. 11/04/19   [provider]  metFORMIN (GLUCOPHAGE-XR) 500 MG 24 hr tablet Take 500 mg by mouth 2 (two) times daily. 01/27/19   [provider]  montelukast (SINGULAIR) 10 MG tablet Take 10 mg by mouth 2 (two) times daily.     [provider]  Multiple Vitamin (MULTIVITAMIN WITH MINERALS) TABS tablet Take 1 tablet by mouth daily.     [provider]  Naphazoline HCl (CLEAR EYES OP) Place 1 drop into both eyes daily. Clear eyes conplete    [provider]  nitroGLYCERIN (NITROSTAT) 0.4 MG SL tablet Place 1 tablet (0.4 mg total) under the tongue every 5 (five) minutes as needed for chest pain. 01/20/11   Wellington Hampshire, MD  ondansetron (ZOFRAN ODT) 4 MG disintegrating  tablet Take 1 tablet (4 mg total) by mouth every 8 (eight) hours as needed for nausea or vomiting. 08/06/19   Ainsley Spinner, PA-C  OXYGEN Inhale 2 L into the lungs at bedtime as needed (Copd).    [provider]  OZEMPIC, 0.25 OR 0.5 MG/DOSE, 2 MG/1.5ML SOPN Inject 0.5 mg into the skin once a week. 10/04/19   [provider]  pantoprazole (PROTONIX) 40 MG tablet Take 40 mg by mouth daily.     [provider]  potassium chloride (MICRO-K) 10 MEQ CR capsule Take 10 mEq by mouth daily. 07/08/19   [provider]  PRESCRIPTION MEDICATION Inhale into the lungs at bedtime. CPAP    [provider]  torsemide (DEMADEX) 20 MG tablet Take 2 tablets (40 mg total) by mouth daily. Patient taking differently: Take 30 mg by mouth 2 (two) times daily.  12/19/18 06/24/20  Arnoldo Lenis, MD  vitamin C (VITAMIN C) 1000 MG tablet Take 1 tablet (1,000 mg total) by mouth daily. 03/23/19   Ainsley Spinner, PA-C  zolpidem (AMBIEN) 10 MG tablet Take 10 mg by mouth at bedtime.     [provider]      Family History    Family History  Problem Relation Age of Onset   Coronary artery disease Other        Family History   He indicated that the status of his other is unknown.  Social History    Social History   Socioeconomic History   Marital status: Married    Spouse name: PATRICIA   Number of children: Not on file   Years of education: Not on file   Highest education level: Not on file  Occupational History   Occupation: SELF EMPLOYED  Tobacco Use   Smoking status: Former Smoker    Packs/day: 1.50    Years: 26.00    Pack years: 39.00    Types: Cigarettes    Start date: 07/16/1959    Quit date: 04/18/1985    Years since quitting: 34.8   Smokeless tobacco: Former User    Types: Snuff, Chew    Quit date: 04/18/1985  Substance and Sexual Activity   Alcohol use: Yes    Alcohol/week: 0.0 standard drinks    Comment: Occasionaly   Drug use: No   Sexual activity: Not on file  Other Topics Concern   Not on file  Social History Narrative   Not on file   Social Determinants of Health   Financial Resource Strain:    Difficulty of Paying Living Expenses: Not on file  Food Insecurity:    Worried About [place] in the Last Year: Not on file   Ran Out of Food in the Last Year: Not on file  Transportation Needs:    Lack of Transportation (Medical): Not on file   Lack of Transportation (Non-Medical): Not on file  Physical Activity:    Days of Exercise per Week: Not on file   Minutes of Exercise per Session: Not on file  Stress:    Feeling of Stress : Not on file  Social Connections:    Frequency of Communication with Friends and Family: Not on file   Frequency of Social Gatherings with Friends and Family: Not on file   Attends Religious Services: Not on file   Active Member of Clubs or Organizations: Not on file   Attends Archivist Meetings: Not on file   Marital Status: Not on file  Intimate Partner  Violence:    Fear  of Current or Ex-Partner: Not on file   Emotionally Abused: Not on file   Physically Abused: Not on file   Sexually Abused: Not on file     Review of Systems    General:  No chills, fever, night sweats or weight changes.  Cardiovascular:  No chest pain, dyspnea on exertion, edema, orthopnea, palpitations, paroxysmal nocturnal dyspnea. Dermatological: No rash, lesions/masses Respiratory: No cough, dyspnea Urologic: No hematuria, dysuria Abdominal:   No nausea, vomiting, diarrhea, bright red blood per rectum, melena, or hematemesis Neurologic:  No visual changes, wkns, changes in mental status. All other systems reviewed and are otherwise negative except as noted above.  Physical Exam    VS:  BP 134/72    Pulse 68    Ht 5\' 10"  (1.778 m)    Wt 238 lb (108 kg)    SpO2 96%    BMI 34.15 kg/m  , BMI Body mass index is 34.15 kg/m. GEN: Well nourished, well developed, in no acute distress. HEENT: normal. Neck: Supple, no JVD, carotid bruits, or masses. Cardiac: RRR, no murmurs, rubs, or gallops. No clubbing, cyanosis, edema.  Radials/DP/PT 2+ and equal bilaterally.  Respiratory:  Respirations regular and unlabored, clear to auscultation bilaterally. GI: Soft, nontender, nondistended, BS + x 4. MS: no deformity or atrophy. Skin: warm and dry, no rash. Neuro:  Strength and sensation are intact. Psych: Normal affect.  Accessory Clinical Findings    Recent Labs: 08/06/2019: ALT 15; BUN 22; Creatinine, Ser 1.62; Hemoglobin 12.7; Platelets 235; Potassium 3.4; Sodium 139   Recent Lipid Panel No results found for: CHOL, TRIG, HDL, CHOLHDL, VLDL, LDLCALC, LDLDIRECT  ECG personally reviewed by me today-normal sinus rhythm no ST or T wave deviation 69 bpm- No acute changes  Nuclear stress test 02/28/2020  No diagnostic ST segment changes to indicate ischemia.  Small, mild intensity, apical to basal inferior defect that is fixed and most consistent with soft tissue attenuation rather than  scar in light of normal wall motion. No significant ischemic territories noted.  This is a low risk study.  Nuclear stress EF: 63%.  Assessment & Plan   1.  Coronary artery disease-no chest pain today.  Nuclear stress test 02/28/2020 showed no ischemia, low risk, and EF 63%. Continue aspirin, atorvastatin, amlodipine carvedilol, Imdur, nitroglycerin Heart healthy low-sodium diet-salty 6 given Increase physical activity as tolerated  Essential hypertension-BP today 134/72.  Well-controlled at home. Continue carvedilol, amlodipine Heart healthy low-sodium diet-salty 6 given Increase physical activity as tolerated  Hyperlipidemia-on statin.  Reports compliance. Continue atorvastatin Heart healthy low-sodium diet-salty 6 given Increase physical activity as tolerated  Lower extremity edema-euvolemic today.  Has remained stable. Continue torsemide Heart healthy low-sodium diet-salty 6 given Increase physical activity as tolerated    Disposition: Follow-up with Dr. Harl Bowie in 6 months.    Jossie Ng. Regino Fournet NP-C    03/02/2020, 8:33 AM [address] Office [phone] Fax [phone]  Notice: This dictation was prepared with Dragon dictation along with smaller phrase technology. Any transcriptional errors that result from this process are unintentional and may not be corrected upon review.

## 2020-03-02 ENCOUNTER — Ambulatory Visit: Payer: Medicare Other | Admitting: General Practice

## 2020-03-02 ENCOUNTER — Other Ambulatory Visit: Payer: Self-pay

## 2020-03-02 ENCOUNTER — Encounter: Payer: Self-pay | Admitting: General Practice

## 2020-03-02 VITALS — BP 134/72 | HR 68 | Ht 70.0 in | Wt 238.0 lb

## 2020-03-02 DIAGNOSIS — E782 Mixed hyperlipidemia: Secondary | ICD-10-CM

## 2020-03-02 DIAGNOSIS — I10 Essential (primary) hypertension: Secondary | ICD-10-CM | POA: Diagnosis not present

## 2020-03-02 DIAGNOSIS — R600 Localized edema: Secondary | ICD-10-CM

## 2020-03-02 DIAGNOSIS — I2510 Atherosclerotic heart disease of native coronary artery without angina pectoris: Secondary | ICD-10-CM

## 2020-03-02 MED ORDER — NITROGLYCERIN 0.4 MG SL SUBL: 0.4000 mg | SUBLINGUAL_TABLET | SUBLINGUAL | 3 refills | Status: DC | PRN

## 2020-03-02 NOTE — Patient Instructions (Signed)
Medication Instructions:  Your physician recommends that you continue on your current medications as directed. Please refer to the Current Medication list given to you today.  *If you need a refill on your cardiac medications before your next appointment, please call your pharmacy*  I refilled your NTG   Lab Work: None today If you have labs (blood work) drawn today and your tests are completely normal, you will receive your results only by: Marland Kitchen MyChart Message (if you have MyChart) OR . A paper copy in the mail If you have any lab test that is abnormal or we need to change your treatment, we will call you to review the results.   Testing/Procedures: None today   Follow-Up: At [facility], you and your health needs are our priority.  As part of our continuing mission to provide you with exceptional heart care, we have created designated Provider Care Teams.  These Care Teams include your primary Cardiologist (physician) and Advanced Practice Providers (APPs -  Physician Assistants and Nurse Practitioners) who all work together to provide you with the care you need, when you need it.  We recommend signing up for the patient portal called "MyChart".  Sign up information is provided on this After Visit Summary.  MyChart is used to connect with patients for Virtual Visits (Telemedicine).  Patients are able to view lab/test results, encounter notes, upcoming appointments, etc.  Non-urgent messages can be sent to your provider as well.   To learn more about what you can do with MyChart, go to [email or web address].    Your next appointment:   6 month(s)  The format for your next appointment:   In Person  Provider:   Carlyle Dolly, MD   Other Instructions Follow the Salty Six sodium guidelines I provided for you.

## 2020-03-03 ENCOUNTER — Ambulatory Visit (HOSPITAL_COMMUNITY)
Admission: RE | Admit: 2020-03-03 | Discharge: 2020-03-03 | Disposition: A | Discharge status: Home / Self Care | Payer: Medicare Other | Source: Ambulatory Visit | Attending: Orthopedic Surgery | Admitting: Orthopedic Surgery

## 2020-03-03 DIAGNOSIS — S72452N Displaced supracondylar fracture without intracondylar extension of lower end of left femur, subsequent encounter for open fracture type IIIA, IIIB, or IIIC with nonunion: Secondary | ICD-10-CM

## 2020-03-17 ENCOUNTER — Ambulatory Visit: Payer: Medicare Other | Admitting: Cardiology

## 2020-03-25 ENCOUNTER — Other Ambulatory Visit: Payer: Self-pay | Admitting: Cardiology

## 2020-04-25 ENCOUNTER — Other Ambulatory Visit: Payer: Self-pay | Admitting: Cardiology

## 2020-04-27 ENCOUNTER — Other Ambulatory Visit: Payer: Self-pay | Admitting: *Deleted

## 2020-04-27 MED ORDER — ISOSORBIDE MONONITRATE ER 30 MG PO TB24: ORAL_TABLET | ORAL | 3 refills | Status: DC

## 2020-05-06 ENCOUNTER — Ambulatory Visit: Payer: Medicare Other | Admitting: Cardiology

## 2020-05-06 NOTE — Progress Notes (Deleted)
Clinical Summary Mr. Perrier is a 75 y.o.male seen today for follow up of the following medical problems.  1. CAD  - hx of BMS to LAD in 2003  - cath 01/2011 with LM patent, LAD with prox stent mild ISR, patent mid stent, 90% mid LAD lesion. LCX 30% prox, OM2 30-40%, RCA small non-dom with no disease. Received additional stent to LAD at that time, a septal Yecheskel Kurek was jailed. LVEF 65% by LV gram.  - exercise cardiolite from 08/2013 from prior cardiologist. 6 minutes with no ischemic changes, 87%THR, LVEF 58%, moderate size mild intensity partially reversible inferior defect thought to be soft tissue attenuation but cannot rule out RCA ischemia -[name] admit with chest pain 04/2014. Sharp pain mid to left chest, 10/10. No other associated symptoms. Not positional. Better with NG. Symptoms would last just a few minutes at at time. Lexiscan MPI during admission with no ischemia, LVEF 60%. 05/2014 cath at [facility] patent coronaries -01/2017 echo: LVEF 87-56%, grade I diastolic dysfunction   43/3295 nuclear stress: probable normal study, cannot exclude small distal inferior ischemia. Low risk   - no recent chest pains. Resolved with higher imdur dose - compliant with meds   02/2020 nuclear stress: no ischemia  2. LE swelling - ongoing swelling at times, resolves at night. Wears compressoin stockings. - has had prior leg surgeries - swelling overall stable    3. HTN -he is compliant with meds   4. Hyperlipidemia -he is compliant with statin   5. COPD - night time oxygen.  - followed by Dr Clydene Pugh  6. Femur fracture - admitted 03/2019 after fall - s/p surgery   Past Medical History:  Diagnosis Date  . Asthma   . Coronary atherosclerosis of native coronary artery  04/22/2001      Cardiac Catheterization  . Diverticulitis   . Encounter for long-term (current) use of insulin ([place])   . Esophageal reflux   . Gout, unspecified   . Old myocardial  infarction   . Personal history of tobacco use, presenting hazards to health   . Postsurgical percutaneous transluminal coronary angioplasty status   . Pure hypercholesterolemia   . Sleep apnea   . 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 Unspecified essential hypertension      Allergies  Allergen Reactions  . Lisinopril Cough     Current Outpatient Medications  Medication Sig Dispense Refill  . acarbose (PRECOSE) 100 MG tablet Take 100 mg by mouth 3 (three) times daily.    Marland Kitchen allopurinol (ZYLOPRIM) 300 MG tablet Take 300 mg by mouth daily.     Marland Kitchen amLODipine (NORVASC) 10 MG tablet Take 1 tablet (10 mg total) by mouth daily. (Patient taking differently: Take 10 mg by mouth at bedtime. ) 30 tablet 6  . aspirin EC 81 MG tablet Take 81 mg by mouth daily.     Marland Kitchen atorvastatin (LIPITOR) 80 MG tablet TAKE 1 TABLET (80 MG TOTAL) BY MOUTH DAILY. (STOP SIMVASTATIN) (Patient taking differently: Take 80 mg by mouth daily. ) 30 tablet 6  . budesonide-formoterol (SYMBICORT) 160-4.5 MCG/ACT inhaler Inhale 2 puffs into the lungs 2 (two) times daily.    . carvedilol (COREG) 25 MG tablet Take 25 mg by mouth 2 (two) times daily with a meal.      . cholecalciferol (VITAMIN D) 25 MCG tablet Take 2 tablets (2,000 Units total) by mouth daily. 60 tablet  2  . gabapentin (NEURONTIN) 300 MG capsule Take 600 mg by mouth at bedtime.     Marland Kitchen HYDROcodone-acetaminophen (NORCO/VICODIN) 5-325 MG tablet Take 1-2 tablets by mouth 2 (two) times daily as needed.    Marland Kitchen ipratropium (ATROVENT) 0.03 % nasal spray Place 2 sprays into both nostrils See admin instructions. Use 2 sprays in each nostril every morning, may also use 2 sprays in each nostril at night as needed for congestion    . isosorbide mononitrate (IMDUR) 30 MG 24 hr tablet TAKE 1 & 1/2 TABLETS (45MG ) BY MOUTH ONCE DAILY 135 tablet 3  . levocetirizine (XYZAL) 5 MG tablet Take 1 tablet by  mouth daily.    . metFORMIN (GLUCOPHAGE-XR) 500 MG 24 hr tablet Take 500 mg by mouth 2 (two) times daily.    . montelukast (SINGULAIR) 10 MG tablet Take 10 mg by mouth 2 (two) times daily.     . Multiple Vitamin (MULTIVITAMIN WITH MINERALS) TABS tablet Take 1 tablet by mouth daily.     . Naphazoline HCl (CLEAR EYES OP) Place 1 drop into both eyes daily. Clear eyes conplete    . nitroGLYCERIN (NITROSTAT) 0.4 MG SL tablet Place 1 tablet (0.4 mg total) under the tongue every 5 (five) minutes as needed for chest pain. 25 tablet 3  . ondansetron (ZOFRAN ODT) 4 MG disintegrating tablet Take 1 tablet (4 mg total) by mouth every 8 (eight) hours as needed for nausea or vomiting. 20 tablet 0  . OXYGEN Inhale 2 L into the lungs at bedtime as needed (Copd).    Marland Kitchen OZEMPIC, 0.25 OR 0.5 MG/DOSE, 2 MG/1.5ML SOPN Inject 0.5 mg into the skin once a week.    . pantoprazole (PROTONIX) 40 MG tablet Take 40 mg by mouth daily.     . potassium chloride (MICRO-K) 10 MEQ CR capsule Take 10 mEq by mouth daily.    Marland Kitchen PRESCRIPTION MEDICATION Inhale into the lungs at bedtime. CPAP    . torsemide (DEMADEX) 20 MG tablet Take 1 tablet (20 mg total) by mouth 2 (two) times daily. 180 tablet 0  . vitamin C (VITAMIN C) 1000 MG tablet Take 1 tablet (1,000 mg total) by mouth daily. 30 tablet 0  . zolpidem (AMBIEN) 10 MG tablet Take 10 mg by mouth at bedtime.      No current facility-administered medications for this visit.     Past Surgical History:  Procedure Laterality Date  . 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 CARDIAC CATHETERIZATION  2003   70% proximal LAD (plaque rupture), 40 % mid. Left dominent  . CARDIAC CATHETERIZATION  01/26/2011   LAD: Patent proximal stent. 90% calcified eccentic stenosis in med segment, mild LCX disease  . CAROTID STENTS    . CORONARY ANGIOPLASTY WITH STENT PLACEMENT  2003   Prox LAD: 3.0 X12 BMS  . CORONARY ANGIOPLASTY WITH STENT PLACEMENT  01/26/2011   Mid LAD:  3.0 X15 mm Vision BMS. Complicated by a jailed septal Carla Whilden and periprocedural MI  . HEMORRHOID SURGERY    . HERNIA REPAIR    . ORIF FEMUR FRACTURE Left 03/19/2019   Procedure: OPEN REDUCTION INTERNAL FIXATION (ORIF) DISTAL FEMUR FRACTURE;  Surgeon: Altamese Hollymead, MD;  Location: [facility];  Service: Orthopedics;  Laterality: Left;  . ORIF FEMUR FRACTURE Left 08/06/2019   Procedure: REPAIR DISTAL FEMUR NONUNION WITH INFUSE AND ALLOGRAFT;  Surgeon: Altamese North Johns, MD;  Location: [facility];  Service: Orthopedics;  Laterality: Left;  Allergies  Allergen Reactions  . Lisinopril Cough      Family History  Problem Relation Age of Onset  . Coronary artery disease Other        Family History     Social History Mr. Sena reports that he quit smoking about 35 years ago. His smoking use included cigarettes. He started smoking about 60 years ago. He has a 39.00 pack-year smoking history. He quit smokeless tobacco use about 35 years ago.  His smokeless tobacco use included snuff and chew. Mr. Clausing reports current alcohol use.   Review of Systems CONSTITUTIONAL: No weight loss, fever, chills, weakness or fatigue.  HEENT: Eyes: No visual loss, blurred vision, double vision or yellow sclerae.No hearing loss, sneezing, congestion, runny nose or sore throat.  SKIN: No rash or itching.  CARDIOVASCULAR:  RESPIRATORY: No shortness of breath, cough or sputum.  GASTROINTESTINAL: No anorexia, nausea, vomiting or diarrhea. No abdominal pain or blood.  GENITOURINARY: No burning on urination, no polyuria NEUROLOGICAL: No headache, dizziness, syncope, paralysis, ataxia, numbness or tingling in the extremities. No change in bowel or bladder control.  MUSCULOSKELETAL: No muscle, back pain, joint pain or stiffness.  LYMPHATICS: No enlarged nodes. No history of splenectomy.  PSYCHIATRIC: No history of depression or anxiety.  ENDOCRINOLOGIC: No reports of sweating, cold or heat intolerance. No polyuria or  polydipsia.  Marland Kitchen   Physical Examination There were no vitals filed for this visit. There were no vitals filed for this visit.  Gen: resting comfortably, no acute distress HEENT: no scleral icterus, pupils equal round and reactive, no palptable cervical adenopathy,  CV Resp: Clear to auscultation bilaterally GI: abdomen is soft, non-tender, non-distended, normal bowel sounds, no hepatosplenomegaly MSK: extremities are warm, no edema.  Skin: warm, no rash Neuro:  no focal deficits Psych: appropriate affect   Diagnostic Studies  02/2020 nuclear stress  No diagnostic ST segment changes to indicate ischemia.  Small, mild intensity, apical to basal inferior defect that is fixed and most consistent with soft tissue attenuation rather than scar in light of normal wall motion. No significant ischemic territories noted.  This is a low risk study.  Nuclear stress EF: 63%.    Assessment and Plan  1. CAD - symptoms resolved with prior increase in imdur, continue to monitor at this time.   2. LE edema/SOB -priorEcho with normal LVEF, grade I diasotlic dysfunction -overall stable, continue current diuretic.   3. HTN - at goal, continue current meds  4. Hyperlipidemia - request pcp labs, continue statin.       Arnoldo Lenis, M.D.

## 2020-05-20 ENCOUNTER — Telehealth: Payer: Self-pay | Admitting: Cardiology

## 2020-05-20 NOTE — Telephone Encounter (Signed)
   [facility] Pre-operative Risk Assessment    HEARTCARE STAFF: - Please ensure there is not already an duplicate clearance open for this procedure. - Under Visit Info/Reason for Call, type in Other and utilize the format Clearance MM/DD/YY or Clearance TBD. Do not use dashes or single digits. - If request is for dental extraction, please clarify the # of teeth to be extracted.  Request for surgical clearance:  1. What type of surgery is being performed? Revision IT tka w removal of hardware  2. When is this surgery scheduled? 06/18/20  3. What type of clearance is required (medical clearance vs. Pharmacy clearance to hold med vs. Both)? both  4. Are there any medications that need to be held prior to surgery and how long? Advise on aspirin  5. Practice name and name of physician performing surgery? [facility]  6. What is the office phone number? [phone]   7.   What is the office fax number? [phone]   8.   Anesthesia type (None, local, MAC, general) ? Choice?   Jannet Askew 05/20/2020, 8:43 AM  _________________________________________________________________   (provider comments below)

## 2020-05-28 NOTE — Telephone Encounter (Signed)
Recommend proceeding with surgery, ok to hold asprin as needed for procedure   Zandra Abts MD

## 2020-05-28 NOTE — Telephone Encounter (Signed)
   Primary Cardiologist: Carlyle Dolly, MD  Chart revisited as part of pre-operative protocol coverage. I was able to get in touch with the patient's wife. She put the patient on the phone who requested I speak with his wife too since he is hard of hearing. They both affirm he has been doing well without any new cardiac symptoms. Per Dr. Harl Bowie, "Recommend proceeding with surgery, ok to hold asprin as needed for procedure."  The patient was advised that if he develops new symptoms prior to surgery to contact our office to arrange for a follow-up visit, and he verbalized understanding.  I will route this recommendation to the requesting party via Epic fax function and remove from pre-op pool. Please call with questions.  Charlie Pitter, PA-C 05/28/2020, 3:29 PM

## 2020-05-28 NOTE — Telephone Encounter (Signed)
call the [phone]. I just s/w pt's wife and pt is there as well. I explained that we have tried a number of times to reach him. I did confirm both the home # and the 434# I stated that you will try to call back in just a few minutes.

## 2020-05-28 NOTE — Telephone Encounter (Signed)
I just called the office for Dr. Alvan Dame and confirmed ph # [phone] and fax # [phone] are correct.

## 2020-05-28 NOTE — Telephone Encounter (Signed)
We need confirmation of the patient phone numbers not the office #s. Both numbers listed in Epic for the patient do not work. Do not ring and both say cannot connect call.

## 2020-05-28 NOTE — Telephone Encounter (Signed)
Wife's phone number does not work still when Immunologist by [employer]. Tried again *67 and it just rang and rang each time. LMTCB. In the meantime will route to Dr. Harl Bowie to see if he is OK with patient holding ASA if needed for surgery - Revision IT tka w removal of hardware. Dr. Harl Bowie - Please route response to P [facility] PREOP (the pre-op pool). Thank you.

## 2020-05-28 NOTE — Telephone Encounter (Signed)
   Primary Cardiologist: Carlyle Dolly, MD  Chart reviewed as part of pre-operative protocol coverage. Patient was contacted 05/28/2020 in reference to pre-operative risk assessment for pending surgery as outlined below.  [name] was last seen on 03/02/20 by Thomasene Mohair NP. H/o CAD s/p prior PCI (last path reported 2016 with patent anatomy), HTN, HLD, DM, COPD on night time O2, femur fx, LE edema, gout acid reflux, OSA. Last PCI appears to be in 2012 per notes. Last nuc 02/2020 was felt to be low risk, EF normal.  Neither of the phone numbers work in the chart. Will route to callback staff to see if they can investigate from surgeon another contact #.  Charlie Pitter, PA-C 05/28/2020, 8:55 AM

## 2020-06-07 NOTE — H&P (Signed)
TOTAL KNEE ADMISSION H&P  Patient is being admitted for left total knee arthroplasty and removal of hardware  Subjective:  Chief Complaint:   Left knee OA / pain with retained hardware from previous orthopaedic surgery  HPI: [name], 75 y.o. male, has a history of pain and functional disability in the left knee due to trauma and arthritis and has failed non-surgical conservative treatments for greater than 12 weeks to include NSAID's and/or analgesics, use of assistive devices and activity modification.  Onset of symptoms was abrupt, starting in November 2020 after an ORIF years ago with gradually worsening course since that time. The patient noted prior procedures on the knee to include  ORIF x 2 on the left knee(s).  Patient currently rates pain in the left knee(s) at 8 out of 10 with activity. Patient has night pain, worsening of pain with activity and weight bearing, pain that interferes with activities of daily living, pain with passive range of motion, crepitus and joint swelling.  Patient has evidence of periarticular osteophytes, joint space narrowing and previous hardware by imaging studies. There is no active infection.  Risks, benefits and expectations were discussed with the patient.  Risks including but not limited to the risk of anesthesia, blood clots, nerve damage, blood vessel damage, failure of the prosthesis, infection and up to and including death.  Patient understand the risks, benefits and expectations and wishes to proceed with surgery.   D/C Plans:       Home   Post-op Meds:       No Rx given  Tranexamic Acid:      To be given - IV   Decadron:      Is to be given  FYI:      ASA  Norco  DME:   Pt already has equipment  PT:   HHPT then OPPT     Patient Active Problem List   Diagnosis Date Noted  . Diabetes ([place]) 03/20/2019  . Periprosthetic supracondylar fracture of femur, left 03/18/2019  . Pure hypercholesterolemia   . Essential hypertension   .  Coronary atherosclerosis of native coronary artery 04/22/2001   Past Medical History:  Diagnosis Date  . Asthma   . Coronary atherosclerosis of native coronary artery  04/22/2001      Cardiac Catheterization  . Diverticulitis   . Encounter for long-term (current) use of insulin ([place])   . Esophageal reflux   . Gout, unspecified   . Old myocardial infarction   . Personal history of tobacco use, presenting hazards to health   . Postsurgical percutaneous transluminal coronary angioplasty status   . Pure hypercholesterolemia   . Sleep apnea   . 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 Unspecified essential hypertension     Past Surgical History:  Procedure Laterality Date  . 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 CARDIAC CATHETERIZATION  2003   70% proximal LAD (plaque rupture), 40 % mid. Left dominent  . CARDIAC CATHETERIZATION  01/26/2011   LAD: Patent proximal stent. 90% calcified eccentic stenosis in med segment, mild LCX disease  . CAROTID STENTS    . CORONARY ANGIOPLASTY WITH STENT PLACEMENT  2003   Prox LAD: 3.0 X12 BMS  . CORONARY ANGIOPLASTY WITH STENT PLACEMENT  01/26/2011   Mid LAD: 3.0 X15 mm Vision BMS. Complicated by a jailed septal branch and periprocedural MI  .  HEMORRHOID SURGERY    . HERNIA REPAIR    . ORIF FEMUR FRACTURE Left 03/19/2019   Procedure: OPEN REDUCTION INTERNAL FIXATION (ORIF) DISTAL FEMUR FRACTURE;  Surgeon: Altamese [place], MD;  Location: [place];  Service: Orthopedics;  Laterality: Left;  . ORIF FEMUR FRACTURE Left 08/06/2019   Procedure: REPAIR DISTAL FEMUR NONUNION WITH INFUSE AND ALLOGRAFT;  Surgeon: Altamese Barronett, MD;  Location: [place];  Service: Orthopedics;  Laterality: Left;    No current facility-administered medications for this encounter.   Current Outpatient Medications  Medication Sig Dispense Refill Last Dose  . acarbose (PRECOSE)  100 MG tablet Take 100 mg by mouth 3 (three) times daily.     Marland Kitchen allopurinol (ZYLOPRIM) 300 MG tablet Take 300 mg by mouth daily.      Marland Kitchen amLODipine (NORVASC) 10 MG tablet Take 1 tablet (10 mg total) by mouth daily. (Patient taking differently: Take 10 mg by mouth at bedtime.) 30 tablet 6   . aspirin EC 81 MG tablet Take 81 mg by mouth daily.      Marland Kitchen atorvastatin (LIPITOR) 80 MG tablet TAKE 1 TABLET (80 MG TOTAL) BY MOUTH DAILY. (STOP SIMVASTATIN) (Patient taking differently: Take 80 mg by mouth daily at 6 PM.) 30 tablet 6   . azithromycin (ZITHROMAX) 250 MG tablet Take 250 mg by mouth every Monday, Wednesday, and Friday.     . budesonide-formoterol (SYMBICORT) 160-4.5 MCG/ACT inhaler Inhale 2 puffs into the lungs 2 (two) times daily.     . carvedilol (COREG) 25 MG tablet Take 25 mg by mouth 2 (two) times daily with a meal.     . fluticasone (FLONASE) 50 MCG/ACT nasal spray Place 1 spray into both nostrils daily.     Marland Kitchen gabapentin (NEURONTIN) 300 MG capsule Take 600 mg by mouth at bedtime.      Marland Kitchen ipratropium (ATROVENT) 0.03 % nasal spray Place 2 sprays into both nostrils See admin instructions. Use 2 sprays in each nostril every morning, may also use 2 sprays in each nostril at night as needed for congestion     . isosorbide mononitrate (IMDUR) 30 MG 24 hr tablet TAKE 1 & 1/2 TABLETS (45MG ) BY MOUTH ONCE DAILY (Patient taking differently: Take 45 mg by mouth daily.) 135 tablet 3   . levocetirizine (XYZAL) 5 MG tablet Take 5 mg by mouth every evening.     . metFORMIN (GLUCOPHAGE-XR) 500 MG 24 hr tablet Take 500 mg by mouth 2 (two) times daily.     . montelukast (SINGULAIR) 10 MG tablet Take 10 mg by mouth at bedtime.     . Multiple Vitamin (MULTIVITAMIN WITH MINERALS) TABS tablet Take 1 tablet by mouth daily.      . Naphazoline HCl (CLEAR EYES OP) Place 1 drop into both eyes daily. Clear eyes conplete     . nitroGLYCERIN (NITROSTAT) 0.4 MG SL tablet Place 1 tablet (0.4 mg total) under the tongue every 5  (five) minutes as needed for chest pain. (Patient taking differently: Place 0.4 mg under the tongue every 5 (five) minutes x 3 doses as needed for chest pain.) 25 tablet 3   . ondansetron (ZOFRAN ODT) 4 MG disintegrating tablet Take 1 tablet (4 mg total) by mouth every 8 (eight) hours as needed for nausea or vomiting. 20 tablet 0   . OZEMPIC, 0.25 OR 0.5 MG/DOSE, 2 MG/1.5ML SOPN Inject 0.5 mg into the skin every Thursday.     . pantoprazole (PROTONIX) 40 MG tablet Take 40 mg by mouth daily.     Marland Kitchen  torsemide (DEMADEX) 20 MG tablet Take 1 tablet (20 mg total) by mouth 2 (two) times daily. 180 tablet 0   . valsartan (DIOVAN) 80 MG tablet Take 80 mg by mouth daily.     . vitamin C (VITAMIN C) 1000 MG tablet Take 1 tablet (1,000 mg total) by mouth daily. 30 tablet 0   . zolpidem (AMBIEN) 10 MG tablet Take 10 mg by mouth at bedtime as needed for sleep.     . OXYGEN Inhale 2 L into the lungs at bedtime as needed (Copd).     Marland Kitchen PRESCRIPTION MEDICATION Inhale into the lungs at bedtime. CPAP      Allergies  Allergen Reactions  . Lisinopril Cough    Social History   Tobacco Use  . Smoking status: Former Smoker    Packs/day: 1.50    Years: 26.00    Pack years: 39.00    Types: Cigarettes    Start date: 07/16/1959    Quit date: 04/18/1985    Years since quitting: 35.1  . Smokeless tobacco: Former User    Types: Snuff, Sarina Ser    Quit date: 04/18/1985  Substance Use Topics  . Alcohol use: Yes    Alcohol/week: 0.0 standard drinks    Comment: Occasionaly    Family History  Problem Relation Age of Onset  . Coronary artery disease Other        Family History     Review of Systems  Constitutional: Negative.   HENT: Negative.   Eyes: Negative.   Respiratory: Negative.   Cardiovascular: Negative.   Gastrointestinal: Negative.   Genitourinary: Negative.   Musculoskeletal: Positive for joint pain.  Skin: Negative.   Neurological: Negative.   Endo/Heme/Allergies: Negative.   Psychiatric/Behavioral:  The patient has insomnia.      Objective:  Physical Exam Constitutional:      Appearance: He is well-developed.  HENT:     Head: Normocephalic.  Eyes:     Pupils: Pupils are equal, round, and reactive to light.  Neck:     Thyroid: No thyromegaly.     Vascular: No JVD.     Trachea: No tracheal deviation.  Cardiovascular:     Rate and Rhythm: Normal rate and regular rhythm.     Pulses: Intact distal pulses.  Pulmonary:     Effort: Pulmonary effort is normal. No respiratory distress.     Breath sounds: Normal breath sounds. No wheezing.  Abdominal:     Palpations: Abdomen is soft.     Tenderness: There is no abdominal tenderness. There is no guarding.  Musculoskeletal:     Cervical back: Neck supple.     Left knee: Swelling and bony tenderness present. No erythema or ecchymosis. Decreased range of motion. Tenderness present.  Lymphadenopathy:     Cervical: No cervical adenopathy.  Skin:    General: Skin is warm and dry.  Neurological:     Mental Status: He is alert and oriented to person, place, and time.     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  Psychiatric:        Mood and Affect: Mood and affect normal.      Labs:  Estimated body mass index is 34.15 kg/m as calculated from the following:   Height as of 03/02/20: 5\' 10"  (1.778 m).   Weight as of 03/02/20: 108 kg.   Imaging Review Plain radiographs demonstrate severe degenerative joint disease of the left knee. The bone quality appears to be good for age and reported  activity level.      Assessment/Plan:  End stage arthritis, left knee   The patient history, physical examination, clinical judgment of the provider and imaging studies are consistent with end stage degenerative joint disease of the left knee(s) and total knee arthroplasty and removal of hardware is deemed medically necessary. The treatment options including medical management, injection therapy arthroscopy and  arthroplasty were discussed at length. The risks and benefits of total knee arthroplasty were presented and reviewed. The risks due to aseptic loosening, infection, stiffness, patella tracking problems, thromboembolic complications and other imponderables were discussed. The patient acknowledged the explanation, agreed to proceed with the plan and consent was signed. Patient is being admitted for treatment for surgery, pain control, PT, OT, prophylactic antibiotics, VTE prophylaxis, progressive ambulation and ADL's and discharge planning. The patient is planning to be discharged home with home health services     Patient's anticipated LOS is less than 2 midnights, meeting these requirements: - Younger than 25 - Lives within 1 hour of care - Has a competent adult at home to recover with post-op recover - NO history of  - Chronic pain requiring opiods  - Heart failure  - Heart attack  - Stroke  - DVT/VTE  - Cardiac arrhythmia  - Respiratory Failure/COPD  - Renal failure  - Anemia  - Advanced Liver disease

## 2020-06-08 NOTE — Patient Instructions (Addendum)
DUE TO COVID-19 ONLY ONE VISITOR IS ALLOWED TO COME WITH YOU AND STAY IN THE WAITING ROOM ONLY DURING PRE OP AND PROCEDURE DAY OF SURGERY. THE 1 VISITOR  MAY VISIT WITH YOU AFTER SURGERY IN YOUR PRIVATE ROOM DURING VISITING HOURS ONLY!  YOU NEED TO HAVE A COVID 19 TEST ON: 06/15/20 @        , THIS TEST MUST BE DONE BEFORE SURGERY,  COVID TESTING SITE [place] [id number], IT IS ON THE RIGHT GOING OUT [address] APPROXIMATELY  2 MINUTES PAST [employer] ON THE RIGHT. ONCE YOUR COVID TEST IS COMPLETED,  PLEASE BEGIN THE QUARANTINE INSTRUCTIONS AS OUTLINED IN YOUR HANDOUT.                [name]   Your procedure is scheduled on: 06/18/20   Report to [facility] Main  Entrance   Report to short stay at: 5:30 AM     Call this number if you have problems the morning of surgery [phone]    Remember:   NO SOLID FOOD AFTER MIDNIGHT THE NIGHT PRIOR TO SURGERY. NOTHING BY MOUTH EXCEPT CLEAR LIQUIDS UNTIL: 4:15 AM .   CLEAR LIQUID DIET  Foods Allowed                                                                     Foods Excluded  Coffee and tea, regular and decaf                             liquids that you cannot  Plain Jell-O any favor except red or purple                                           see through such as: Fruit ices (not with fruit pulp)                                     milk, soups, orange juice  Iced Popsicles                                    All solid food Carbonated beverages, regular and diet                                    Cranberry, grape and apple juices Sports drinks like Gatorade Lightly seasoned clear broth or consume(fat free) Sugar, honey syrup  Sample Menu Breakfast                                Lunch                                     Supper Cranberry juice  Beef broth                            Chicken broth Jell-O                                     Grape juice                            Apple juice Coffee or tea                        Jell-O                                      Popsicle                                                Coffee or tea                        Coffee or tea  _____________________________________________________________________   BRUSH YOUR TEETH MORNING OF SURGERY AND RINSE YOUR MOUTH OUT, NO CHEWING GUM CANDY OR MINTS.    Take these medicines the morning of surgery with A SIP OF WATER: amlodipine,isosorbide,allopurinol,pantoprazole,azithromycin,carvedilol.Use flonase,eye drops and inhalers as usual.  How to Manage Your Diabetes Before and After Surgery  Why is it important to control my blood sugar before and after surgery? . Improving blood sugar levels before and after surgery helps healing and can limit problems. . A way of improving blood sugar control is eating a healthy diet by: o  Eating less sugar and carbohydrates o  Increasing activity/exercise o  Talking with your doctor about reaching your blood sugar goals . High blood sugars (greater than 180 mg/dL) can raise your risk of infections and slow your recovery, so you will need to focus on controlling your diabetes during the weeks before surgery. . Make sure that the doctor who takes care of your diabetes knows about your planned surgery including the date and location.  How do I manage my blood sugar before surgery? . Check your blood sugar at least 4 times a day, starting 2 days before surgery, to make sure that the level is not too high or low. o Check your blood sugar the morning of your surgery when you wake up and every 2 hours until you get to the Short Stay unit. . If your blood sugar is less than 70 mg/dL, you will need to treat for low blood sugar: o Do not take insulin. o Treat a low blood sugar (less than 70 mg/dL) with  cup of clear juice (cranberry or apple), 4 glucose tablets, OR glucose gel. o Recheck blood sugar in 15 minutes after treatment (to make sure  it is greater than 70 mg/dL). If your blood sugar is not greater than 70 mg/dL on recheck, call [phone] for further instructions. . Report your blood sugar to the short stay nurse when you get to Short Stay.  . If you are admitted to the hospital after surgery: o Your blood sugar will be checked by the staff  and you will probably be given insulin after surgery (instead of oral diabetes medicines) to make sure you have good blood sugar levels. o The goal for blood sugar control after surgery is 80-180 mg/dL.   WHAT DO I DO ABOUT MY DIABETES MEDICATION?  Marland Kitchen Do not take oral diabetes medicines (pills) the morning of surgery.  . THE  DAY BEFORE SURGERY, take Metformin as needed. Take Ozempic.       THE MORNING OF SURGERY, DO NOT TAKE ANY DIABETIC MEDICATIONS DAY OF YOUR SURGERY                               You may not have any metal on your body including hair pins and              piercings  Do not wear jewelry, lotions, powders or perfumes, deodorant             Men may shave face and neck.   Do not bring valuables to the hospital. [place].  Contacts, dentures or bridgework may not be worn into surgery.  Leave suitcase in the car. After surgery it may be brought to your room.     Patients discharged the day of surgery will not be allowed to drive home. IF YOU ARE HAVING SURGERY AND GOING HOME THE SAME DAY, YOU MUST HAVE AN ADULT TO DRIVE YOU HOME AND BE WITH YOU FOR 24 HOURS. YOU MAY GO HOME BY TAXI OR UBER OR ORTHERWISE, BUT AN ADULT MUST ACCOMPANY YOU HOME AND STAY WITH YOU FOR 24 HOURS.  Name and phone number of your driver:  Special Instructions: N/A              Please read over the following fact sheets you were given: ___________________________________________________________________  PLEASE BRING CPAP MASK [place]. DEVICE WILL BE PROVIDED!       [place] - Preparing for Surgery Before surgery, you can play  an important role.  Because skin is not sterile, your skin needs to be as free of germs as possible.  You can reduce the number of germs on your skin by washing with CHG (chlorahexidine gluconate) soap before surgery.  CHG is an antiseptic cleaner which kills germs and bonds with the skin to continue killing germs even after washing. Please DO NOT use if you have an allergy to CHG or antibacterial soaps.  If your skin becomes reddened/irritated stop using the CHG and inform your nurse when you arrive at Short Stay. Do not shave (including legs and underarms) for at least 48 hours prior to the first CHG shower.  You may shave your face/neck. Please follow these instructions carefully:  1.  Shower with CHG Soap the night before surgery and the  morning of Surgery.  2.  If you choose to wash your hair, wash your hair first as usual with your  normal  shampoo.  3.  After you shampoo, rinse your hair and body thoroughly to remove the  shampoo.                           4.  Use CHG as you would any other liquid soap.  You can apply chg directly  to the skin and wash  Gently with a scrungie or clean washcloth.  5.  Apply the CHG Soap to your body ONLY FROM THE NECK DOWN.   Do not use on face/ open                           Wound or open sores. Avoid contact with eyes, ears mouth and genitals (private parts).                       Wash face,  Genitals (private parts) with your normal soap.             6.  Wash thoroughly, paying special attention to the area where your surgery  will be performed.  7.  Thoroughly rinse your body with warm water from the neck down.  8.  DO NOT shower/wash with your normal soap after using and rinsing off  the CHG Soap.                9.  Pat yourself dry with a clean towel.            10.  Wear clean pajamas.            11.  Place clean sheets on your bed the night of your first shower and do not  sleep with pets. Day of Surgery : Do not apply any  lotions/deodorants the morning of surgery.  Please wear clean clothes to the hospital/surgery center.  FAILURE TO FOLLOW THESE INSTRUCTIONS MAY RESULT IN THE CANCELLATION OF YOUR SURGERY PATIENT SIGNATURE_________________________________  NURSE SIGNATURE__________________________________  ________________________________________________________________________  Adam Phenix  An incentive spirometer is a tool that can help keep your lungs clear and active. This tool measures how well you are filling your lungs with each breath. Taking long deep breaths may help reverse or decrease the chance of developing breathing (pulmonary) problems (especially infection) following:  A long period of time when you are unable to move or be active. BEFORE THE PROCEDURE   If the spirometer includes an indicator to show your best effort, your nurse or respiratory therapist will set it to a desired goal.  If possible, sit up straight or lean slightly forward. Try not to slouch.  Hold the incentive spirometer in an upright position. INSTRUCTIONS FOR USE  1. Sit on the edge of your bed if possible, or sit up as far as you can in bed or on a chair. 2. Hold the incentive spirometer in an upright position. 3. Breathe out normally. 4. Place the mouthpiece in your mouth and seal your lips tightly around it. 5. Breathe in slowly and as deeply as possible, raising the piston or the ball toward the top of the column. 6. Hold your breath for 3-5 seconds or for as long as possible. Allow the piston or ball to fall to the bottom of the column. 7. Remove the mouthpiece from your mouth and breathe out normally. 8. Rest for a few seconds and repeat Steps 1 through 7 at least 10 times every 1-2 hours when you are awake. Take your time and take a few normal breaths between deep breaths. 9. The spirometer may include an indicator to show your best effort. Use the indicator as a goal to work toward during each  repetition. 10. After each set of 10 deep breaths, practice coughing to be sure your lungs are clear. If you have an incision (the cut made at the time of surgery),  support your incision when coughing by placing a pillow or rolled up towels firmly against it. Once you are able to get out of bed, walk around indoors and cough well. You may stop using the incentive spirometer when instructed by your caregiver.  RISKS AND COMPLICATIONS  Take your time so you do not get dizzy or light-headed.  If you are in pain, you may need to take or ask for pain medication before doing incentive spirometry. It is harder to take a deep breath if you are having pain. AFTER USE  Rest and breathe slowly and easily.  It can be helpful to keep track of a log of your progress. Your caregiver can provide you with a simple table to help with this. If you are using the spirometer at home, follow these instructions: [place] IF:   You are having difficultly using the spirometer.  You have trouble using the spirometer as often as instructed.  Your pain medication is not giving enough relief while using the spirometer.  You develop fever of 100.5 F (38.1 C) or higher. SEEK IMMEDIATE MEDICAL CARE IF:   You cough up bloody sputum that had not been present before.  You develop fever of 102 F (38.9 C) or greater.  You develop worsening pain at or near the incision site. MAKE SURE YOU:   Understand these instructions.  Will watch your condition.  Will get help right away if you are not doing well or get worse. Document Released: 08/15/2006 Document Revised: 06/27/2011 Document Reviewed: 10/16/2006 [facility] Patient Information 2014 [place], [place].   ________________________________________________________________________

## 2020-06-09 ENCOUNTER — Encounter (HOSPITAL_COMMUNITY): Payer: Self-pay

## 2020-06-09 ENCOUNTER — Encounter (HOSPITAL_COMMUNITY)
Admission: RE | Admit: 2020-06-09 | Discharge: 2020-06-09 | Disposition: A | Discharge status: Home / Self Care | Payer: Medicare Other | Source: Ambulatory Visit | Attending: Orthopedic Surgery | Admitting: Orthopedic Surgery

## 2020-06-09 ENCOUNTER — Other Ambulatory Visit: Payer: Self-pay

## 2020-06-09 DIAGNOSIS — Z01812 Encounter for preprocedural laboratory examination: Secondary | ICD-10-CM | POA: Insufficient documentation

## 2020-06-09 HISTORY — DX: Unspecified osteoarthritis, unspecified site: M19.90

## 2020-06-09 HISTORY — DX: Chronic obstructive pulmonary disease, unspecified: J44.9

## 2020-06-09 LAB — CBC
HCT: 34 % — ABNORMAL LOW (ref 39.0–52.0)
Hemoglobin: 12 g/dL — ABNORMAL LOW (ref 13.0–17.0)
MCH: 33.1 pg (ref 26.0–34.0)
MCHC: 35.3 g/dL (ref 30.0–36.0)
MCV: 93.9 fL (ref 80.0–100.0)
Platelets: 155 10*3/uL (ref 150–400)
RBC: 3.62 MIL/uL — ABNORMAL LOW (ref 4.22–5.81)
RDW: 13.8 % (ref 11.5–15.5)
WBC: 9.4 10*3/uL (ref 4.0–10.5)
nRBC: 0 % (ref 0.0–0.2)

## 2020-06-09 LAB — BASIC METABOLIC PANEL
Anion gap: 9 (ref 5–15)
BUN: 20 mg/dL (ref 8–23)
CO2: 26 mmol/L (ref 22–32)
Calcium: 8.5 mg/dL — ABNORMAL LOW (ref 8.9–10.3)
Chloride: 104 mmol/L (ref 98–111)
Creatinine, Ser: 1.3 mg/dL — ABNORMAL HIGH (ref 0.61–1.24)
GFR, Estimated: 58 mL/min — ABNORMAL LOW (ref >60)
Glucose, Bld: 259 mg/dL — ABNORMAL HIGH (ref 70–99)
Potassium: 3.5 mmol/L (ref 3.5–5.1)
Sodium: 139 mmol/L (ref 135–145)

## 2020-06-09 LAB — HEMOGLOBIN A1C
Hgb A1c MFr Bld: 7.9 % — ABNORMAL HIGH (ref 4.8–5.6)
Mean Plasma Glucose: 180.03 mg/dL

## 2020-06-09 LAB — GLUCOSE, CAPILLARY: Glucose-Capillary: 205 mg/dL — ABNORMAL HIGH (ref 70–99)

## 2020-06-09 NOTE — Progress Notes (Signed)
COVID Vaccine Completed: Yes Date COVID Vaccine completed: 01/09/20 COVID vaccine manufacturer: Pfizer     PCP - Dr. Ralph Leyden Cardiologist - Dr. Carlyle Dolly. Clearance: Dayna Dunn PA-C: 06/18/20  Chest x-ray -  EKG - 03/02/20 Stress Test -  ECHO - 01/25/17 Cardiac Cath -  Pacemaker/ICD device last checked:  Sleep Study - Yes CPAP - Yes  Fasting Blood Sugar - 100's  Checks Blood Sugar ___2__ times a day  Blood Thinner Instructions: Aspirin Instructions: Last Dose:  Anesthesia review: Hx: DIA,HTN,CAD,OSA(CAP)  Patient denies shortness of breath, fever, cough and chest pain at PAT appointment   Patient verbalized understanding of instructions that were given to them at the PAT appointment. Patient was also instructed that they will need to review over the PAT instructions again at home before surgery.

## 2020-06-10 LAB — SURGICAL PCR SCREEN
MRSA, PCR: POSITIVE — AB
Staphylococcus aureus: POSITIVE — AB

## 2020-06-10 NOTE — Progress Notes (Signed)
PCR: POSITIVE : MRSA 

## 2020-06-15 ENCOUNTER — Other Ambulatory Visit (HOSPITAL_COMMUNITY): Payer: Medicare Other

## 2020-06-15 ENCOUNTER — Other Ambulatory Visit: Payer: Self-pay | Admitting: Internal Medicine

## 2020-06-15 DIAGNOSIS — R9389 Abnormal findings on diagnostic imaging of other specified body structures: Secondary | ICD-10-CM

## 2020-06-18 ENCOUNTER — Encounter (HOSPITAL_COMMUNITY): Admission: RE | Payer: Self-pay | Source: Other Acute Inpatient Hospital

## 2020-06-18 ENCOUNTER — Inpatient Hospital Stay (HOSPITAL_COMMUNITY)
Admission: RE | Admit: 2020-06-18 | Payer: Medicare Other | Source: Other Acute Inpatient Hospital | Admitting: Orthopedic Surgery

## 2020-06-18 LAB — TYPE AND SCREEN
ABO/RH(D): O POS
Antibody Screen: NEGATIVE

## 2020-06-18 SURGERY — TOTAL KNEE REVISION
Anesthesia: Spinal | Site: Knee | Laterality: Left

## 2020-06-25 ENCOUNTER — Other Ambulatory Visit: Payer: Self-pay

## 2020-06-25 ENCOUNTER — Ambulatory Visit (HOSPITAL_COMMUNITY)
Admission: RE | Admit: 2020-06-25 | Discharge: 2020-06-25 | Disposition: A | Discharge status: Home / Self Care | Payer: Medicare Other | Source: Ambulatory Visit | Attending: Internal Medicine | Admitting: Internal Medicine

## 2020-06-25 DIAGNOSIS — R9389 Abnormal findings on diagnostic imaging of other specified body structures: Secondary | ICD-10-CM

## 2020-08-05 ENCOUNTER — Other Ambulatory Visit: Payer: Self-pay | Admitting: Urology

## 2020-08-05 ENCOUNTER — Other Ambulatory Visit (HOSPITAL_COMMUNITY): Payer: Self-pay | Admitting: Urology

## 2020-08-05 DIAGNOSIS — C61 Malignant neoplasm of prostate: Secondary | ICD-10-CM

## 2020-08-06 ENCOUNTER — Ambulatory Visit (HOSPITAL_COMMUNITY)
Admission: RE | Admit: 2020-08-06 | Discharge: 2020-08-06 | Disposition: A | Discharge status: Home / Self Care | Payer: Medicare Other | Source: Ambulatory Visit | Attending: Urology | Admitting: Urology

## 2020-08-06 ENCOUNTER — Other Ambulatory Visit: Payer: Self-pay

## 2020-08-06 DIAGNOSIS — C61 Malignant neoplasm of prostate: Secondary | ICD-10-CM

## 2020-08-06 LAB — POCT I-STAT CREATININE: Creatinine, Ser: 1.8 mg/dL — ABNORMAL HIGH (ref 0.61–1.24)

## 2020-08-06 MED ORDER — IOHEXOL 300 MG/ML  SOLN
100.0000 mL | Freq: Once | INTRAMUSCULAR | Status: AC | PRN
  Administered 2020-08-06: 80 mL via INTRAVENOUS

## 2020-08-12 ENCOUNTER — Encounter (HOSPITAL_COMMUNITY)
Admission: RE | Admit: 2020-08-12 | Discharge: 2020-08-12 | Disposition: A | Discharge status: Home / Self Care | Payer: Medicare Other | Source: Ambulatory Visit | Attending: Urology | Admitting: Urology

## 2020-08-12 ENCOUNTER — Encounter (HOSPITAL_COMMUNITY): Payer: Self-pay

## 2020-08-12 DIAGNOSIS — C61 Malignant neoplasm of prostate: Secondary | ICD-10-CM | POA: Diagnosis not present

## 2020-08-12 HISTORY — DX: Malignant (primary) neoplasm, unspecified: C80.1

## 2020-08-12 MED ORDER — TECHNETIUM TC 99M MEDRONATE IV KIT
20.0000 | PACK | Freq: Once | INTRAVENOUS | Status: AC | PRN
  Administered 2020-08-12: 22 via INTRAVENOUS

## 2020-08-20 ENCOUNTER — Encounter (HOSPITAL_COMMUNITY): Payer: Medicare Other

## 2020-08-21 ENCOUNTER — Encounter: Payer: Self-pay | Admitting: Medical Oncology

## 2020-08-21 NOTE — Progress Notes (Signed)
I called pt to introduce myself as the Prostate Nurse Navigator and the Coordinator of the Prostate [place].  1. I confirmed with the patient he is aware of his referral to the clinic 5/13, arriving @ 9 am.  2. I discussed the format of the clinic and the physicians he will be seeing that day.  3. I discussed where the clinic is located and how to contact me.  4. I confirmed his address and informed him I would be mailing a packet of information and forms to be completed. I asked him to bring them with him the day of his appointment.   He voiced understanding of the above. I asked him to call me if he has any questions or concerns regarding his appointments or the forms he needs to complete.

## 2020-08-25 ENCOUNTER — Encounter: Payer: Self-pay | Admitting: Medical Oncology

## 2020-08-27 ENCOUNTER — Encounter: Payer: Self-pay | Admitting: Medical Oncology

## 2020-08-27 NOTE — Progress Notes (Signed)
Left message to confirm appointment for [facility] 5/13, arriving @ 8 am.

## 2020-08-27 NOTE — Progress Notes (Signed)
GU Location of Tumor / Histology: prostatic adenocarcinoma  If Prostate Cancer, Gleason Score is (4 + 5) and PSA is (5.5). Prostate volume: 52.74 grams.   Biopsies of prostate (if applicable) revealed:    Past/Anticipated interventions by urology, if any: prostate biopsy, CT abd/pelvis (negative), bone scan (negative), referral to [facility].  Past/Anticipated interventions by medical oncology, if any: no  Weight changes, if any: denies  Bowel/Bladder complaints, if any: IPSS 4. SHIM 18.  Denies dysuria, hematuria, urinary leakage or incontinence. Denies any bowel complaints.  Nausea/Vomiting, if any: denies  Pain issues, if any:  Reports back and joint pain. Reports arthritis. Denies new pain.  SAFETY ISSUES:  Prior radiation? denies  Pacemaker/ICD? denies  Possible current pregnancy? no, male patient  Is the patient on methotrexate? no  Current Complaints / other details:  75 year old male. Married. Resides in [place].

## 2020-08-28 ENCOUNTER — Encounter: Payer: Self-pay | Admitting: Radiation Oncology

## 2020-08-28 ENCOUNTER — Ambulatory Visit
Admission: RE | Admit: 2020-08-28 | Discharge: 2020-08-28 | Disposition: A | Discharge status: Home / Self Care | Payer: Medicare Other | Source: Ambulatory Visit | Attending: Radiation Oncology | Admitting: Radiation Oncology

## 2020-08-28 ENCOUNTER — Encounter (HOSPITAL_COMMUNITY): Payer: Self-pay

## 2020-08-28 ENCOUNTER — Ambulatory Visit (HOSPITAL_COMMUNITY): Payer: Medicare Other

## 2020-08-28 ENCOUNTER — Other Ambulatory Visit: Payer: Self-pay

## 2020-08-28 ENCOUNTER — Encounter: Payer: Self-pay | Admitting: General Practice

## 2020-08-28 ENCOUNTER — Encounter: Payer: Self-pay | Admitting: Medical Oncology

## 2020-08-28 ENCOUNTER — Other Ambulatory Visit (HOSPITAL_COMMUNITY): Payer: Medicare Other

## 2020-08-28 ENCOUNTER — Inpatient Hospital Stay: Payer: Medicare Other | Attending: Oncology | Admitting: Oncology

## 2020-08-28 VITALS — BP 145/72 | HR 63 | Temp 97.6°F | Resp 20 | Ht 70.0 in | Wt 245.8 lb

## 2020-08-28 DIAGNOSIS — E119 Type 2 diabetes mellitus without complications: Secondary | ICD-10-CM | POA: Insufficient documentation

## 2020-08-28 DIAGNOSIS — K219 Gastro-esophageal reflux disease without esophagitis: Secondary | ICD-10-CM | POA: Insufficient documentation

## 2020-08-28 DIAGNOSIS — C61 Malignant neoplasm of prostate: Secondary | ICD-10-CM | POA: Insufficient documentation

## 2020-08-28 DIAGNOSIS — M109 Gout, unspecified: Secondary | ICD-10-CM | POA: Diagnosis not present

## 2020-08-28 DIAGNOSIS — Z87891 Personal history of nicotine dependence: Secondary | ICD-10-CM | POA: Insufficient documentation

## 2020-08-28 DIAGNOSIS — E039 Hypothyroidism, unspecified: Secondary | ICD-10-CM | POA: Insufficient documentation

## 2020-08-28 DIAGNOSIS — R109 Unspecified abdominal pain: Secondary | ICD-10-CM | POA: Diagnosis not present

## 2020-08-28 DIAGNOSIS — I2510 Atherosclerotic heart disease of native coronary artery without angina pectoris: Secondary | ICD-10-CM | POA: Diagnosis not present

## 2020-08-28 DIAGNOSIS — G4730 Sleep apnea, unspecified: Secondary | ICD-10-CM | POA: Diagnosis not present

## 2020-08-28 DIAGNOSIS — E785 Hyperlipidemia, unspecified: Secondary | ICD-10-CM | POA: Diagnosis not present

## 2020-08-28 DIAGNOSIS — J45909 Unspecified asthma, uncomplicated: Secondary | ICD-10-CM | POA: Diagnosis not present

## 2020-08-28 DIAGNOSIS — I10 Essential (primary) hypertension: Secondary | ICD-10-CM | POA: Insufficient documentation

## 2020-08-28 DIAGNOSIS — I252 Old myocardial infarction: Secondary | ICD-10-CM | POA: Diagnosis not present

## 2020-08-28 DIAGNOSIS — R351 Nocturia: Secondary | ICD-10-CM | POA: Insufficient documentation

## 2020-08-28 HISTORY — DX: Unspecified malignant neoplasm of skin, unspecified: C44.90

## 2020-08-28 HISTORY — DX: Malignant neoplasm of prostate: C61

## 2020-08-28 NOTE — Progress Notes (Signed)
Reason for the request:    Prostate cancer  HPI: I was asked by Dr. Diona Fanti to evaluate [name] for the diagnosis of prostate cancer.  He is a 75 year old man with elevated PSA dating back to 2021.  At that time on December 9 his PSA was 8.03 and based on these findings he was referred to Dr. Diona Fanti by his primary care provider.  At that time his PSA was 5.5.  He underwent a prostate Biopsy on July 24, 2020 which showed a Gleason score of 4+5 = 9 in both lobes of the prostate and 11 out of 12 cores.  Based on these findings he was referred to the prostate cancer multidisciplinary clinic.   Imaging studies including CT scan and bone scan did not show any evidence of metastatic disease.   Upon evaluation today he has reported lower abdominal pain and pelvic discomfort although no major urinary difficulties.  He does report some nocturia and no dysuria.  He denies any back pain or shoulder pain.  He does report some occasional pelvic pain and knee pain.  He still ambulating although limited in his mobility at this time.  His performance status quality of life remains reasonable.  He does not report any headaches, blurry vision, syncope or seizures. Does not report any fevers, chills or sweats.  Does not report any cough, wheezing or hemoptysis.  Does not report any chest pain, palpitation, orthopnea or leg edema.  Does not report any nausea, vomiting or abdominal pain.  Does not report any constipation or diarrhea.  Does not report any skeletal complaints.    Does not report frequency, urgency or hematuria.  Does not report any skin rashes or lesions. Does not report any heat or cold intolerance.  Does not report any lymphadenopathy or petechiae.  Does not report any anxiety or depression.  Remaining review of systems is negative.    Past Medical History:  Diagnosis Date  . Arthritis   . Asthma   . Cancer [facility])    Prostate  . COPD (chronic obstructive pulmonary disease) ([place])   . Coronary  atherosclerosis of native coronary artery  04/22/2001      Cardiac Catheterization  . Diverticulitis   . Encounter for long-term (current) use of insulin ([place])   . Esophageal reflux   . Gout, unspecified   . Old myocardial infarction    2  . Personal history of tobacco use, presenting hazards to health   . Postsurgical percutaneous transluminal coronary angioplasty status   . Prostate cancer ([place])   . Pure hypercholesterolemia   . Skin cancer   . Sleep apnea    CPAP  . 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 Unspecified essential hypertension   :  Past Surgical History:  Procedure Laterality Date  . 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 CARDIAC CATHETERIZATION  2003   70% proximal LAD (plaque rupture), 40 % mid. Left dominent  . CARDIAC CATHETERIZATION  01/26/2011   LAD: Patent proximal stent. 90% calcified eccentic stenosis in med segment, mild LCX disease  . CAROTID STENTS    . CORONARY ANGIOPLASTY WITH STENT PLACEMENT  2003   Prox LAD: 3.0 X12 BMS  . CORONARY ANGIOPLASTY WITH STENT PLACEMENT  01/26/2011   Mid LAD: 3.0 X15 mm Vision BMS. Complicated by a jailed septal branch and periprocedural MI  . HEMORRHOID SURGERY    .  HERNIA REPAIR    . HUMERUS FRACTURE SURGERY Left   . ORIF FEMUR FRACTURE Left 03/19/2019   Procedure: OPEN REDUCTION INTERNAL FIXATION (ORIF) DISTAL FEMUR FRACTURE;  Surgeon: Altamese Barnegat Light, MD;  Location: [place];  Service: Orthopedics;  Laterality: Left;  . ORIF FEMUR FRACTURE Left 08/06/2019   Procedure: REPAIR DISTAL FEMUR NONUNION WITH INFUSE AND ALLOGRAFT;  Surgeon: Altamese Megargel, MD;  Location: [place];  Service: Orthopedics;  Laterality: Left;  :   Current Outpatient Medications:  .  acarbose (PRECOSE) 100 MG tablet, Take 100 mg by mouth 3 (three) times daily., Disp: , Rfl:  .  allopurinol (ZYLOPRIM) 300 MG tablet, Take 300 mg by mouth daily.  , Disp: , Rfl:  .  amLODipine (NORVASC) 10 MG tablet, Take 1 tablet (10 mg total) by mouth daily. (Patient taking differently: Take 10 mg by mouth at bedtime.), Disp: 30 tablet, Rfl: 6 .  aspirin EC 81 MG tablet, Take 81 mg by mouth daily. , Disp: , Rfl:  .  atorvastatin (LIPITOR) 80 MG tablet, TAKE 1 TABLET (80 MG TOTAL) BY MOUTH DAILY. (STOP SIMVASTATIN) (Patient taking differently: Take 80 mg by mouth daily at 6 PM.), Disp: 30 tablet, Rfl: 6 .  budesonide-formoterol (SYMBICORT) 160-4.5 MCG/ACT inhaler, Inhale 2 puffs into the lungs 2 (two) times daily., Disp: , Rfl:  .  carvedilol (COREG) 25 MG tablet, Take 25 mg by mouth 2 (two) times daily with a meal., Disp: , Rfl:  .  fluticasone (FLONASE) 50 MCG/ACT nasal spray, Place 1 spray into both nostrils daily., Disp: , Rfl:  .  gabapentin (NEURONTIN) 300 MG capsule, Take 600 mg by mouth at bedtime. , Disp: , Rfl:  .  ipratropium (ATROVENT) 0.03 % nasal spray, Place 2 sprays into both nostrils See admin instructions. Use 2 sprays in each nostril every morning, may also use 2 sprays in each nostril at night as needed for congestion, Disp: , Rfl:  .  isosorbide mononitrate (IMDUR) 30 MG 24 hr tablet, TAKE 1 & 1/2 TABLETS (45MG ) BY MOUTH ONCE DAILY (Patient taking differently: Take 45 mg by mouth daily.), Disp: 135 tablet, Rfl: 3 .  levocetirizine (XYZAL) 5 MG tablet, Take 5 mg by mouth every evening., Disp: , Rfl:  .  metFORMIN (GLUCOPHAGE-XR) 500 MG 24 hr tablet, Take 500 mg by mouth 2 (two) times daily., Disp: , Rfl:  .  montelukast (SINGULAIR) 10 MG tablet, Take 10 mg by mouth at bedtime., Disp: , Rfl:  .  Multiple Vitamin (MULTIVITAMIN WITH MINERALS) TABS tablet, Take 1 tablet by mouth daily. , Disp: , Rfl:  .  Naphazoline HCl (CLEAR EYES OP), Place 1 drop into both eyes daily. Clear eyes conplete, Disp: , Rfl:  .  nitroGLYCERIN (NITROSTAT) 0.4 MG SL tablet, Place 1 tablet (0.4 mg total) under the tongue every 5 (five) minutes as needed for chest  pain. (Patient not taking: Reported on 08/28/2020), Disp: 25 tablet, Rfl: 3 .  ondansetron (ZOFRAN ODT) 4 MG disintegrating tablet, Take 1 tablet (4 mg total) by mouth every 8 (eight) hours as needed for nausea or vomiting., Disp: 20 tablet, Rfl: 0 .  OXYGEN, Inhale 2 L into the lungs at bedtime as needed (Copd)., Disp: , Rfl:  .  OZEMPIC, 0.25 OR 0.5 MG/DOSE, 2 MG/1.5ML SOPN, Inject 0.5 mg into the skin every Thursday., Disp: , Rfl:  .  pantoprazole (PROTONIX) 40 MG tablet, Take 40 mg by mouth daily., Disp: , Rfl:  .  PRESCRIPTION MEDICATION, Inhale into the  lungs at bedtime. CPAP, Disp: , Rfl:  .  torsemide (DEMADEX) 20 MG tablet, Take 1 tablet (20 mg total) by mouth 2 (two) times daily., Disp: 180 tablet, Rfl: 0 .  valsartan (DIOVAN) 80 MG tablet, Take 80 mg by mouth daily., Disp: , Rfl:  .  vitamin C (VITAMIN C) 1000 MG tablet, Take 1 tablet (1,000 mg total) by mouth daily., Disp: 30 tablet, Rfl: 0 .  zolpidem (AMBIEN) 10 MG tablet, Take 10 mg by mouth at bedtime as needed for sleep., Disp: , Rfl: :  Allergies  Allergen Reactions  . Lisinopril Cough  :  Family History  Problem Relation Age of Onset  . Coronary artery disease Other        Family History  . Breast cancer Neg Hx   . Colon cancer Neg Hx   . Prostate cancer Neg Hx   . Pancreatic cancer Neg Hx   :  Social History   Socioeconomic History  . Marital status: Married    Spouse name: PATRICIA  ANN  . Number of children: 2  . Years of education: Not on file  . Highest education level: Not on file  Occupational History  . Occupation: SELF EMPLOYED    Comment: Building control surveyor  Tobacco Use  . Smoking status: Former Smoker    Packs/day: 1.50    Years: 26.00    Pack years: 39.00    Types: Cigarettes    Start date: 07/16/1959    Quit date: 04/18/1985    Years since quitting: 35.3  . Smokeless tobacco: Former Systems developer    Types: Snuff, Sarina Ser    Quit date: 04/18/1985  Vaping Use  . Vaping Use: Never used  Substance and Sexual  Activity  . Alcohol use: Yes    Alcohol/week: 0.0 standard drinks    Comment: Occasionally  . Drug use: No  . Sexual activity: Yes  Other Topics Concern  . Not on file  Social History Narrative   1 OF 2 CHILDREN DECEASED   Social Determinants of Health   Financial Resource Strain: Not on file  Food Insecurity: Not on file  Transportation Needs: Not on file  Physical Activity: Not on file  Stress: Not on file  Social Connections: Not on file  Intimate Partner Violence: Not on file  :  Pertinent items are noted in HPI.  Exam: ECOG 1 General appearance: alert and cooperative appeared without distress. Head: atraumatic without any abnormalities. Eyes: conjunctivae/corneas clear. PERRL.  Sclera anicteric. Throat: lips, mucosa, and tongue normal; without oral thrush or ulcers. Resp: clear to auscultation bilaterally without rhonchi, wheezes or dullness to percussion. Cardio: regular rate and rhythm, S1, S2 normal, no murmur, click, rub or gallop GI: soft, non-tender; bowel sounds normal; no masses,  no organomegaly Skin: Skin color, texture, turgor normal. No rashes or lesions Lymph nodes: Cervical, supraclavicular, and axillary nodes normal. Neurologic: Grossly normal without any motor, sensory or deep tendon reflexes. Musculoskeletal: No joint deformity or effusion.    NM Bone Scan Whole Body  Result Date: 08/13/2020 CLINICAL DATA:  Left knee pain following femur fracture with ORIF in November of 2020. EXAM: NUCLEAR MEDICINE WHOLE BODY BONE SCAN TECHNIQUE: Whole body anterior and posterior images were obtained approximately 3 hours after intravenous injection of radiopharmaceutical. RADIOPHARMACEUTICALS:  Twenty-two mCi Technetium-20m MDP IV COMPARISON:  CT 03/03/2020 FINDINGS: Ordinary low level degenerative type activity is seen in both shoulders, both ankles and the right knee. Left knee shows pattern consistent with the known knee arthroplasty  with photopenia in the regions  of the implant. There is increased activity in the medial metaphyseal region, where the fracture was partially ununited at the time of the CT scan November 2021. This suggests that there is ongoing bone forming activity in this region. IMPRESSION: Increased activity in the medial left femoral metaphysis, at the site partially ununited fracture on the CT scan of 03/03/2020. This indicates that there is ongoing bone forming activity in this region. Electronically Signed   By: Nelson Chimes M.D.   On: 08/13/2020 13:53   CT ABDOMEN PELVIS W CONTRAST  Result Date: 08/06/2020 CLINICAL DATA:  New diagnosis of prostate cancer based on recent biopsy. EXAM: CT ABDOMEN AND PELVIS WITH CONTRAST TECHNIQUE: Multidetector CT imaging of the abdomen and pelvis was performed using the standard protocol following bolus administration of intravenous contrast. CONTRAST:  78mL OMNIPAQUE IOHEXOL 300 MG/ML  SOLN COMPARISON:  None aside from chest CT obtained on June 25, 2020 FINDINGS: Lower chest: No effusion. No consolidation. Scattered small lymph nodes in the chest, see dedicated chest CT for further detail Hepatobiliary: Normal appearance of liver and gallbladder. No pericholecystic stranding. No biliary duct dilation. Portal vein is patent. Pancreas: Normal, without mass, inflammation or ductal dilatation. Spleen: Spleen normal size and contour without focal lesion. Adrenals/Urinary Tract: Adrenal glands are normal. Symmetric renal enhancement. No hydronephrosis. Collapsed urinary bladder. Stomach/Bowel: Colonic diverticulosis. No acute bowel process. Normal appendix. Vascular/Lymphatic: Aortic atherosclerosis without aneurysmal dilation. Patent abdominal vasculature. There is no gastrohepatic or hepatoduodenal ligament lymphadenopathy. No retroperitoneal or mesenteric lymphadenopathy. No pelvic sidewall lymphadenopathy. Reproductive: Prostate with heterogeneity in this patient with known prostate cancer. Other: No ascites.  No  free air. Musculoskeletal: No acute bone finding. No destructive bone process. IMPRESSION: 1. Prostate with heterogeneity in this patient with known prostate cancer. 2. No evidence of metastatic disease in the abdomen or pelvis. 3. Colonic diverticulosis. 4. Three-vessel coronary artery disease. 5. Aortic atherosclerosis. Aortic Atherosclerosis (ICD10-I70.0). Electronically Signed   By: Zetta Bills M.D.   On: 08/06/2020 17:58    Assessment and Plan:   75 year old man with:  1.  Prostate cancer diagnosed in April 2022.  He has a Gleason score 4+5 = 9 and 11 out of 12 cores bilaterally.  His PSA as high as 8.03 in December and currently at 5.5.  Metastatic work-up has been negative but he does have symptoms of abdominal and pelvic discomfort.  His disease status was discussed today with his family and his case was discussed in the prostate cancer multidisciplinary clinic including review of his pathology results as well as imaging studies.  Treatment options were reviewed as well.  The first step of follow-up assessing his disease status and dictating treatment options would be to complete his staging work-up with PSMA scan.  Given his symptomatic disease it is reasonable to evaluate further to rule out any distant metastasis.  Upon completing his imaging studies I recommend proceeding with androgen deprivation therapy in addition to radiation to the prostate and any other metastatic lesions if they are limited.  If he has widespread metastatic disease, that I would recommend proceeding with androgen deprivation therapy without radiation.  Adding oral targeted therapy in the form of abiraterone, enzalutamide would be indicated at that time.  Taxotere chemotherapy could be also an option if he has visceral metastasis which is less likely.  2.  Abdominal discomfort: Could be related to his prostate cancer and proceeding with treatment with androgen deprivation should help with these symptoms after  obtaining PET imaging.  All his questions and family's questions were answered today.   45  minutes were dedicated to this visit. The time was spent on reviewing laboratory data, imaging studies, discussing treatment options, and answering questions regarding future plan.       A copy of this consult has been forwarded to the requesting physician.

## 2020-08-28 NOTE — Progress Notes (Signed)
Radiation Oncology         [phone] ________________________________  Multidisciplinary Prostate Cancer Clinic  Initial Radiation Oncology Consultation  Name: [name] MRN: [id number]  Date: 08/28/2020  DOB: 06-21-1945  [id number], Tilden Fossa, MD  Franchot Gallo, MD   REFERRING PHYSICIAN: Franchot Gallo, MD  DIAGNOSIS: 75 y.o. 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 Malignant neoplasm of prostate ([place])  [name] is a 75 y.o. gentleman.  He has a history of elevated, fluctuating PSA since at least 2018 when his PSA was 4.67, followed by his primary care physician, Dr. Shon [name].  The PSA increased to 4.77 in 12/05/2017 but then decreased down to 3.81 in January 2021.  The PSA was increased up to 8.03 in December 2021 and remained elevated at 5.5.  Accordingly, he was referred for evaluation in urology by Dr. Diona [name] on 05/25/20,  digital rectal examination was performed at that time revealing a 15 mm midline base nodule.  The patient proceeded to transrectal ultrasound with 12 biopsies of the prostate on 07/24/20.  The prostate volume measured 52.74 cc.  Out of 12 core biopsies, 11 were positive.  The maximum Gleason score was 4+5, and this was seen in 10 cores-- with the exception of the left apex (with perineural invasion) and right base, which showed Gleason 4+4, and right mid lateral, which was benign.       He underwent CT A/P on 08/06/20 and bone scan on 08/12/20, both of which were negative for metastatic disease.  There was some increased activity on the bone scan in the left femur at the site of a previous fracture on CT from 03/07/2020.  He is planning to have surgery for revision of the hardware in the left knee in June 2022 due to incomplete healing of the fracture.  The patient reviewed the biopsy results with his urologist and he has  kindly been referred today to the multidisciplinary prostate cancer clinic for presentation of pathology and radiology studies in our conference for discussion of potential radiation treatment options and clinical evaluation.    PREVIOUS RADIATION THERAPY: No  PAST MEDICAL HISTORY:  has a past medical history of Arthritis, Asthma, Cancer ([place]), COPD (chronic obstructive pulmonary disease) ([place]), Coronary atherosclerosis of native coronary artery ( 04/22/2001), Diverticulitis, Encounter for long-term (current) use of insulin ([place]), Esophageal reflux, Gout, unspecified, Old myocardial infarction, Personal history of tobacco use, presenting hazards to health, Postsurgical percutaneous transluminal coronary angioplasty status, Prostate cancer ([place]), Pure hypercholesterolemia, Skin cancer, Sleep apnea, Type II or unspecified type diabetes mellitus without mention of complication, not stated as uncontrolled, and Unspecified essential hypertension.    PAST SURGICAL HISTORY: Past Surgical History:  Procedure Laterality Date  . 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 CARDIAC CATHETERIZATION  2003   70% proximal LAD (plaque rupture), 40 % mid. Left dominent  . CARDIAC CATHETERIZATION  01/26/2011   LAD: Patent proximal stent. 90% calcified eccentic stenosis in med segment, mild LCX disease  . CAROTID STENTS    . CORONARY ANGIOPLASTY WITH STENT PLACEMENT  2003   Prox LAD: 3.0 X12 BMS  . CORONARY ANGIOPLASTY WITH STENT PLACEMENT  01/26/2011   Mid LAD: 3.0 X15 mm Vision BMS. Complicated by a jailed septal branch and periprocedural MI  . HEMORRHOID SURGERY    .  HERNIA REPAIR    . HUMERUS FRACTURE SURGERY Left   . ORIF FEMUR FRACTURE Left 03/19/2019   Procedure: OPEN REDUCTION INTERNAL FIXATION (ORIF) DISTAL FEMUR FRACTURE;  Surgeon: Richard Ballwin, MD;  Location: [facility];  Service: Orthopedics;  Laterality: Left;  . ORIF FEMUR FRACTURE Left 08/06/2019   Procedure: REPAIR DISTAL FEMUR  NONUNION WITH INFUSE AND ALLOGRAFT;  Surgeon: Richard [place], MD;  Location: [facility];  Service: Orthopedics;  Laterality: Left;    FAMILY HISTORY: family history includes Coronary artery disease in an other family member.  SOCIAL HISTORY:  reports that he quit smoking about 35 years ago. His smoking use included cigarettes. He started smoking about 61 years ago. He has a 39.00 pack-year smoking history. He quit smokeless tobacco use about 35 years ago.  His smokeless tobacco use included snuff and chew. He reports current alcohol use. He reports that he does not use drugs.  ALLERGIES: Lisinopril  MEDICATIONS:  Current Outpatient Medications  Medication Sig Dispense Refill  . acarbose (PRECOSE) 100 MG tablet Take 100 mg by mouth 3 (three) times daily.    Marland Kitchen allopurinol (ZYLOPRIM) 300 MG tablet Take 300 mg by mouth daily.     Marland Kitchen amLODipine (NORVASC) 10 MG tablet Take 1 tablet (10 mg total) by mouth daily. (Patient taking differently: Take 10 mg by mouth at bedtime.) 30 tablet 6  . aspirin EC 81 MG tablet Take 81 mg by mouth daily.     Marland Kitchen atorvastatin (LIPITOR) 80 MG tablet TAKE 1 TABLET (80 MG TOTAL) BY MOUTH DAILY. (STOP SIMVASTATIN) (Patient taking differently: Take 80 mg by mouth daily at 6 PM.) 30 tablet 6  . budesonide-formoterol (SYMBICORT) 160-4.5 MCG/ACT inhaler Inhale 2 puffs into the lungs 2 (two) times daily.    . carvedilol (COREG) 25 MG tablet Take 25 mg by mouth 2 (two) times daily with a meal.    . fluticasone (FLONASE) 50 MCG/ACT nasal spray Place 1 spray into both nostrils daily.    Marland Kitchen gabapentin (NEURONTIN) 300 MG capsule Take 600 mg by mouth at bedtime.     Marland Kitchen ipratropium (ATROVENT) 0.03 % nasal spray Place 2 sprays into both nostrils See admin instructions. Use 2 sprays in each nostril every morning, may also use 2 sprays in each nostril at night as needed for congestion    . isosorbide mononitrate (IMDUR) 30 MG 24 hr tablet TAKE 1 & 1/2 TABLETS (45MG ) BY MOUTH ONCE DAILY (Patient  taking differently: Take 45 mg by mouth daily.) 135 tablet 3  . levocetirizine (XYZAL) 5 MG tablet Take 5 mg by mouth every evening.    . metFORMIN (GLUCOPHAGE-XR) 500 MG 24 hr tablet Take 500 mg by mouth 2 (two) times daily.    . montelukast (SINGULAIR) 10 MG tablet Take 10 mg by mouth at bedtime.    . Multiple Vitamin (MULTIVITAMIN WITH MINERALS) TABS tablet Take 1 tablet by mouth daily.     . Naphazoline HCl (CLEAR EYES OP) Place 1 drop into both eyes daily. Clear eyes conplete    . ondansetron (ZOFRAN ODT) 4 MG disintegrating tablet Take 1 tablet (4 mg total) by mouth every 8 (eight) hours as needed for nausea or vomiting. 20 tablet 0  . OXYGEN Inhale 2 L into the lungs at bedtime as needed (Copd).    Marland Kitchen OZEMPIC, 0.25 OR 0.5 MG/DOSE, 2 MG/1.5ML SOPN Inject 0.5 mg into the skin every Thursday.    . pantoprazole (PROTONIX) 40 MG tablet Take 40 mg by mouth daily.    Marland Kitchen  PRESCRIPTION MEDICATION Inhale into the lungs at bedtime. CPAP    . torsemide (DEMADEX) 20 MG tablet Take 1 tablet (20 mg total) by mouth 2 (two) times daily. 180 tablet 0  . valsartan (DIOVAN) 80 MG tablet Take 80 mg by mouth daily.    . vitamin C (VITAMIN C) 1000 MG tablet Take 1 tablet (1,000 mg total) by mouth daily. 30 tablet 0  . zolpidem (AMBIEN) 10 MG tablet Take 10 mg by mouth at bedtime as needed for sleep.    . nitroGLYCERIN (NITROSTAT) 0.4 MG SL tablet Place 1 tablet (0.4 mg total) under the tongue every 5 (five) minutes as needed for chest pain. (Patient not taking: Reported on 08/28/2020) 25 tablet 3   No current facility-administered medications for this encounter.    REVIEW OF SYSTEMS:  On review of systems, the patient reports that he is doing well overall. He denies any chest pain, shortness of breath, cough, fevers, chills, night sweats, unintended weight changes. He denies any bowel disturbances, and denies abdominal pain, nausea or vomiting. He denies any new musculoskeletal or joint aches or pains. His IPSS was  4, indicating mild urinary symptoms. His SHIM was 18, indicating he has mild erectile dysfunction. A complete review of systems is obtained and is otherwise negative.   PHYSICAL EXAM:  Wt Readings from Last 3 Encounters:  08/28/20 245 lb 12.8 oz (111.5 kg)  06/09/20 247 lb (112 kg)  03/02/20 238 lb (108 kg)   Temp Readings from Last 3 Encounters:  08/28/20 97.6 F (36.4 C)  06/09/20 98 F (36.7 C) (Oral)  08/06/19 (!) 97.5 F (36.4 C)   BP Readings from Last 3 Encounters:  08/28/20 (!) 145/72  06/09/20 (!) 125/59  03/02/20 134/72   Pulse Readings from Last 3 Encounters:  08/28/20 63  06/09/20 67  03/02/20 68   Pain Assessment Pain Score: 0-No pain/10  In general this is a well appearing Caucasian male in no acute distress. He's alert and oriented x4 and appropriate throughout the examination. Cardiopulmonary assessment is negative for acute distress and he exhibits normal effort.    KPS = 100  100 - Normal; no complaints; no evidence of disease. 90   - Able to carry on normal activity; minor signs or symptoms of disease. 80   - Normal activity with effort; some signs or symptoms of disease. 5   - Cares for self; unable to carry on normal activity or to do active work. 60   - Requires occasional assistance, but is able to care for most of his personal needs. 50   - Requires considerable assistance and frequent medical care. 13   - Disabled; requires special care and assistance. 96   - Severely disabled; hospital admission is indicated although death not imminent. 59   - Very sick; hospital admission necessary; active supportive treatment necessary. 10   - Moribund; fatal processes progressing rapidly. 0     - Dead  Karnofsky DA, Abel[place], Craver LS and [facility] [phone]) The use of the nitrogen mustards in the palliative treatment of carcinoma: with particular reference to bronchogenic carcinoma Cancer 1 [id number]   LABORATORY DATA:  Lab Results  Component Value  Date   WBC 9.4 06/09/2020   HGB 12.0 (L) 06/09/2020   HCT 34.0 (L) 06/09/2020   MCV 93.9 06/09/2020   PLT 155 06/09/2020   Lab Results  Component Value Date   NA 139 06/09/2020   K 3.5 06/09/2020   CL 104 06/09/2020  CO2 26 06/09/2020   Lab Results  Component Value Date   ALT 15 08/06/2019   AST 21 08/06/2019   ALKPHOS 90 08/06/2019   BILITOT 0.9 08/06/2019     RADIOGRAPHY: NM Bone Scan Whole Body  Result Date: 08/13/2020 CLINICAL DATA:  Left knee pain following femur fracture with ORIF in November of 2020. EXAM: NUCLEAR MEDICINE WHOLE BODY BONE SCAN TECHNIQUE: Whole body anterior and posterior images were obtained approximately 3 hours after intravenous injection of radiopharmaceutical. RADIOPHARMACEUTICALS:  Twenty-two mCi Technetium-37m MDP IV COMPARISON:  CT 03/03/2020 FINDINGS: Ordinary low level degenerative type activity is seen in both shoulders, both ankles and the right knee. Left knee shows pattern consistent with the known knee arthroplasty with photopenia in the regions of the implant. There is increased activity in the medial metaphyseal region, where the fracture was partially ununited at the time of the CT scan November 2021. This suggests that there is ongoing bone forming activity in this region. IMPRESSION: Increased activity in the medial left femoral metaphysis, at the site partially ununited fracture on the CT scan of 03/03/2020. This indicates that there is ongoing bone forming activity in this region. Electronically Signed   By: Nelson Chimes M.D.   On: 08/13/2020 13:53   CT ABDOMEN PELVIS W CONTRAST  Result Date: 08/06/2020 CLINICAL DATA:  New diagnosis of prostate cancer based on recent biopsy. EXAM: CT ABDOMEN AND PELVIS WITH CONTRAST TECHNIQUE: Multidetector CT imaging of the abdomen and pelvis was performed using the standard protocol following bolus administration of intravenous contrast. CONTRAST:  27mL OMNIPAQUE IOHEXOL 300 MG/ML  SOLN COMPARISON:  None  aside from chest CT obtained on June 25, 2020 FINDINGS: Lower chest: No effusion. No consolidation. Scattered small lymph nodes in the chest, see dedicated chest CT for further detail Hepatobiliary: Normal appearance of liver and gallbladder. No pericholecystic stranding. No biliary duct dilation. Portal vein is patent. Pancreas: Normal, without mass, inflammation or ductal dilatation. Spleen: Spleen normal size and contour without focal lesion. Adrenals/Urinary Tract: Adrenal glands are normal. Symmetric renal enhancement. No hydronephrosis. Collapsed urinary bladder. Stomach/Bowel: Colonic diverticulosis. No acute bowel process. Normal appendix. Vascular/Lymphatic: Aortic atherosclerosis without aneurysmal dilation. Patent abdominal vasculature. There is no gastrohepatic or hepatoduodenal ligament lymphadenopathy. No retroperitoneal or mesenteric lymphadenopathy. No pelvic sidewall lymphadenopathy. Reproductive: Prostate with heterogeneity in this patient with known prostate cancer. Other: No ascites.  No free air. Musculoskeletal: No acute bone finding. No destructive bone process. IMPRESSION: 1. Prostate with heterogeneity in this patient with known prostate cancer. 2. No evidence of metastatic disease in the abdomen or pelvis. 3. Colonic diverticulosis. 4. Three-vessel coronary artery disease. 5. Aortic atherosclerosis. Aortic Atherosclerosis (ICD10-I70.0). Electronically Signed   By: Zetta Bills M.D.   On: 08/06/2020 17:58      IMPRESSION/PLAN: 75 y.o. gentleman with Stage T2c adenocarcinoma of the prostate with a Gleason score of 4+5 and a PSA of 5.5.    We discussed the patient's workup and outlined the nature of prostate cancer in this setting. The patient's T stage, Gleason's score, and PSA put him into the high risk group. Accordingly, he is eligible for a variety of potential treatment options including prostatectomy or LT-ADT in combination with either 8 weeks of external radiation or 5 weeks  of external beam radiation preceded by a brachytherapy boost procedure. Our recommendation would be to obtain a PSMA scan in order to completely rule out metastatic disease. We discussed the available radiation techniques, and focused on the details and logistics of delivery.  We discussed and outlined the risks, benefits, short and long-term effects associated with radiotherapy and compared and contrasted these with prostatectomy. We discussed the role of SpaceOAR gel in reducing the rectal toxicity associated with radiotherapy. We also detailed the role of ADT in the treatment of high risk prostate cancer and outlined the associated side effects that could be expected with this therapy. He appears to have a good understanding of his disease and our treatment recommendations which are of curative intent.  He was encouraged to ask questions that were answered to his stated satisfaction.  At the end of the conversation the patient is interested in moving forward with PSMA scan. Pending confirmation of localized disease or limited, oligometastasis, he would like to proceed with brachytherapy boost and use of SpaceOAR gel followed by a 5 week course of daily radiotherapy to the prostate and any metastatic lesions, concurrent with LT-ADT. He has not received his first Lupron injection. We will share our discussion with Dr. Diona [name] and move forward with coordinating a follow-up visit to start ADT, first available. He notes he is scheduled for revision of left knee replacement in June, so we will postpone the start of radiation following recovery from surgery. The patient will be connected with Romie Jumper in our office who will be working closely with him to coordinate OR scheduling and pre and post procedure appointments.  We will contact the pharmaceutical rep to ensure that [place] is available at the time of procedure. He would also prefer to receive the 5 weeks of daily external radiation treatment closer to  home in [place] so we will make the referral to [facility] for further discussion and coordination. He appears to have a good understanding of his disease and our treatment recommendations which are of curative intent and he is in agreement with the stated plan.  We personally spent 60 minutes in this encounter including chart review, reviewing radiological studies, meeting face-to-face with the patient, entering orders and completing documentation.    Nicholos Johns, PA-C    Tyler Pita, MD  [facility] Direct Dial: [phone]  Fax: [phone] [email or web address]  Skype  [email or web address]   This document serves as a record of services personally performed by Tyler Pita, MD and Freeman Caldron, PA-C. It was created on their behalf by Wilburn Mylar, a trained medical scribe. The creation of this record is based on the scribe's personal observations and the provider's statements to them. This document has been checked and approved by the attending provider.

## 2020-08-28 NOTE — Progress Notes (Signed)
                               Care Plan Summary  Name: [name] DOB: Jun 18, 1945   Your Medical Team:   Urologist -  Dr. Raynelle Bring, [facility]  Radiation Oncologist - Dr. Tyler Pita, [facility]   Medical Oncologist - Dr. Zola Button, [place]  Recommendations: 1) PSMA PET- to further evaluate your cancer 2) Androgen Deprivation-hormone injection with radiation  * These recommendations are based on information available as of today's consult.      Recommendations may change depending on the results of further tests or exams.  Next Steps: 1) Dr. Alan Ripper office will schedule the PET scan and hormone injection   When appointments need to be scheduled, you will be contacted by [facility] and/or [facility].  Questions?  Please do not hesitate to call Cira Rue, RN, BSN, OCN at [phone] any questions or concerns.  Shirlean Mylar is your Oncology Nurse Navigator and is available to assist you while you're receiving your medical care at [facility].

## 2020-08-28 NOTE — Progress Notes (Signed)
[place] Psychosocial Distress Screening Spiritual Care  Met with Jerren and his wife and son in [facility] to introduce [facility] team/resources, reviewing distress screen per protocol.  The patient scored a 7 on the Psychosocial Distress Thermometer which indicates severe distress. Also assessed for distress and other psychosocial needs.   ONCBCN DISTRESS SCREENING 08/28/2020  Screening Type Initial Screening  Distress experienced in past week (1-10) 7  Practical problem type Work/school  Family Problem type Partner;Children  Emotional problem type Adjusting to illness  Physical Problem type Pain;Getting around;Bathing/dressing;Swollen arms/legs  Referral to support programs Yes   Mr Scalzo reports that his knee and lower belly pain are his biggest stressors, which limit work and other ADLs. 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   Follow up needed: No. Per family, no other needs at this time, but they know to reach out as needed/desired and have full packet of [place].   [place], [place], [facility] Pager [phone] Voicemail [phone]

## 2020-08-28 NOTE — Consult Note (Signed)
[facility]     08/28/2020   --------------------------------------------------------------------------------   Nolon Nations  MRN: [id number]  DOB: 1945-12-11, 75 year old Male  SSN:    PRIMARY CARE:  Pradeep Pradhan  REFERRING:  Ralph Leyden  PROVIDER:  Franchot Gallo, M.D.  TREATING:  Raynelle Bring, M.D.  LOCATION:  [facility] [phone]     --------------------------------------------------------------------------------   CC/HPI: CC: Prostate Cancer   Physician requesting consult: Dr. Franchot Gallo  PCP: Dr. Doristine Mango  Location of consult: [facility]   Mr. Cremer is a 75 year old gentleman with a past medical history significant for coronary artery disease s/p MI in the past, diabetes, asthma, GERD, hyperlipidemia, gout, hypertension, and arthritis. He has a history of an elevated PSA dating back to 2018. His PSA most recently was noted to be 5.5 in January of 2022 and this prompted a referral to Dr. Diona Fanti. He was found to have a 1.5 cm nodule in the midline of the base of the prostate. He underwent a TRUS biopsy of the prostate on 07/24/20 that confirmed Gleason 4+5=9 adenocarcinoma of the prostate with 11 out of 12 biopsy cores positive for malignancy.   Family history: None.   Imaging studies:  CT abdomen/pelvis (08/06/20): Negative for malignancy.  Bone scan (08/13/20): Negative for malignancy.   PMH: He has a history of CAD s/p MI, DM, gout, hypertension, hyperlipidemia, asthma, GERD, and arthritis.  PSH: Hernia repair.   TNM stage: cT2a N0 M0 (1.5 cm nodule at base in midline)  PSA: 5.5  Gleason score: 4+5=9 (GG 5)  Biopsy (07/24/20): 11/12 cores positive  Left: L lateral apex (50%, 4+5=9), L apex (50%, 4+4=8, PNI), L lateral mid (70%, 4+5=9), L mid (80%, 4+5=9), L lateral base (90%, 4+5=9), L base (90%, 4+5=9)  Right: R apex (5%, 4+5=9), R lateral apex  (20%, 4+5=9), R mid (40%, 4+5=9), R base (95%, 4+5=9), R lateral base (50%, 4+5=9)  Prostate volume: 52.7 cc   Nomogram  OC disease: 6%  EPE: 93%  SVI: 66%  LNI: 55%  PFS (5 year, 10 year): 31%, 19%   Urinary function: IPSS is 4.  Erectile function: SHIM score is 18.     ALLERGIES: Lisinopril    MEDICATIONS: Allopurinol 300 mg tablet  Levofloxacin 750 mg tablet 1 tablet PO Morning of procedure  Metformin Hcl 500 mg tablet  Acarbose 100 mg tablet  Amlodipine Besylate 10 mg tablet  Aspirin Ec 81 mg tablet, delayed release  Atorvastatin Calcium 80 mg tablet  Carvedilol 25 mg tablet  Centrum Silver  Gabapentin 300 mg capsule  Isosorbide Dinitrate 30 mg tablet  Levocetirizine Dihydrochloride 5 mg tablet  Montelukast Sodium 10 mg tablet  Nitroglycerin 0.4 mg tablet, sublingual  Ozempic 0.25 mg or 0.5 mg dose (2 mg/1.5 ml) pen injector  Pantoprazole Sodium 40 mg tablet, delayed release  Symbicort 160 mcg-4.5 mcg/actuation hfa aerosol with adapter  Torsemide 20 mg tablet  Vitamin C 1,000 mg tablet  Vitamin D3  Zolpidem Tartrate 10 mg tablet     GU PSH: Prostate Needle Biopsy - 07/24/2020     NON-GU PSH: Hemorrhoidectomy Hernia Repair Knee replacement, Left Surgical Pathology, Gross And Microscopic Examination For Prostate Needle - 07/24/2020     GU PMH: Elevated PSA, Pt PSA trending towards elevation. DRE shows 44mm midline nodule. He requires further evaluation. - 05/25/2020 Prostate nodule w/o LUTS - 05/25/2020    NON-GU PMH: Arthritis Coronary Artery Disease  Diabetes Type 2 Gout Hypertension Hypothyroidism Myocardial Infarction Sleep Apnea    FAMILY HISTORY: No Family History    SOCIAL HISTORY: Marital Status: Married Preferred Language: English; Ethnicity: Not Hispanic Or Latino; Race: White Current Smoking Status: Patient does not smoke anymore.   Tobacco Use Assessment Completed: Used Tobacco in last 30 days? Drinks 1 caffeinated drink per day.     REVIEW OF SYSTEMS:    GU Review Male:   Patient denies frequent urination, hard to postpone urination, burning/ pain with urination, get up at night to urinate, leakage of urine, stream starts and stops, trouble starting your streams, and have to strain to urinate .  Gastrointestinal (Lower):   Patient denies diarrhea and constipation.  Gastrointestinal (Upper):   Patient denies nausea and vomiting.  Constitutional:   Patient denies weight loss, fever, night sweats, and fatigue.  Skin:   Patient denies skin rash/ lesion and itching.  Eyes:   Patient denies blurred vision and double vision.  Ears/ Nose/ Throat:   Patient denies sore throat and sinus problems.  Hematologic/Lymphatic:   Patient denies swollen glands and easy bruising.  Cardiovascular:   Patient denies leg swelling and chest pains.  Respiratory:   Patient denies cough and shortness of breath.  Endocrine:   Patient denies excessive thirst.  Musculoskeletal:   Patient denies back pain and joint pain.  Neurological:   Patient denies headaches and dizziness.  Psychologic:   Patient denies depression and anxiety.   VITAL SIGNS: None   MULTI-SYSTEM PHYSICAL EXAMINATION:    Constitutional: Well-nourished. No physical deformities. Normally developed. Good grooming.     Complexity of Data:  Lab Test Review:   PSA  Records Review:   Pathology Reports, Previous Patient Records  X-Ray Review: C.T. Abdomen/Pelvis: Reviewed Films.  Bone Scan: Reviewed Films.     05/09/20 03/26/20 04/25/19 11/17/17 02/06/17 11/08/16 10/05/15  PSA  Total PSA 5.5 ng/ml 8.03 ng/ml 3.80 ng/ml 4.77 ng/ml 3.53 ng/ml 4.67 ng/ml 2.66 ng/ml  % Free PSA 15.6 %          PROCEDURES: None   ASSESSMENT:      ICD-10 Details  1 GU:   Prostate Cancer - C61    PLAN:           Document Letter(s):  Created for Patient: Clinical Summary         Notes:   1. High risk prostate cancer: I had a detailed discussion with Mr. Reish, his wife, and his son  today. His staging studies have been reviewed in the GU tumor Board Conference this morning and he has seen both Dr. Alen Blew and Dr. Tammi Klippel already. It was agreed that he likely would benefit from PSMA PET scan imaging for more accurate staging in to direct most appropriate treatment considering his high volume and high-grade disease. I will communicate this to Dr. Alen Blew. Tentatively, he would like to consider proceeding with a long-term androgen deprivation therapy and radiation. He may wish to consider combination therapy with external beam radiation therapy and a radiation seed implant boost. He may want to consider the external beam component closer to home in [place], [place] at [facility] and Dr. Tammi Klippel will help to coordinate this. He understands the results of his PSMA PET scan could potentially alter final recommendations for treatment.   In addition, he does need to undergo revision of his left total knee. I told him that would be quite appropriate for him to begin androgen deprivation therapy as soon as possible with  plans to then proceed with his left knee revision at any time. His radiation could then proceed any time after he has fully recovered from his knee surgery. I will notify Dr. Diona Fanti of the proposed plan to see if he can start his androgen deprivation therapy and in the meantime get his PSA may PET scan ordered.   Cc: Dr. Franchot Gallo  Dr. Cherre Huger  Dr. Paralee Cancel  Dr. Tyler Pita  Dr. Zola Button     E & M CODES: We spent 42 minutes dedicated to evaluation and management time, including face to face interaction, discussions on coordination of care, documentation, result review, and discussion with others as applicable.

## 2020-09-04 ENCOUNTER — Encounter: Payer: Self-pay | Admitting: Medical Oncology

## 2020-09-04 ENCOUNTER — Ambulatory Visit: Payer: Medicare Other | Admitting: Cardiology

## 2020-09-04 NOTE — Progress Notes (Deleted)
Oncology Nurse Navigator Documentation  Oncology Nurse Navigator Flowsheets 08/28/2020 08/27/2020 08/25/2020  Abnormal Finding Date - - -  Confirmed Diagnosis Date - - -  Diagnosis Status Additional Work Up - -  Planned Course of Treatment Hormonal Therapy;Radiation - -  Navigator Follow Up Date: 09/04/2020 - -  Navigator Follow Up Reason: MDC F/U - -  Navigator Location [facility]  Referral Date to RadOnc/MedOnc - - -  Navigator Encounter Type Clinic/MDC Telephone Telephone  Telephone - Appt Confirmation/Clarification;Outgoing Call Outgoing Call;Diagnostic Results  [facility] Date 08/28/2020 - -  Multidisiplinary Clinic Type Prostate - -  Barriers/Navigation Needs Education - Coordination of Care  Education Understanding Cancer/ Treatment Options;Preparing for Upcoming Surgery/ Treatment - -  Interventions Education - Coordination of Care  Acuity Level 2-Minimal Needs (1-2 Barriers Identified) - -  Education Method Teach-back;Verbal;Written;Video - -  Support Groups/Services Friends and Family - -  Time Spent with Patient > 120 15 15

## 2020-09-04 NOTE — Progress Notes (Signed)
Left message requesting a return call as follow up to [facility] 5/13.

## 2020-09-10 ENCOUNTER — Encounter: Payer: Self-pay | Admitting: Medical Oncology

## 2020-09-10 ENCOUNTER — Other Ambulatory Visit (HOSPITAL_COMMUNITY): Payer: Self-pay | Admitting: Urology

## 2020-09-10 DIAGNOSIS — C61 Malignant neoplasm of prostate: Secondary | ICD-10-CM

## 2020-09-28 ENCOUNTER — Other Ambulatory Visit: Payer: Self-pay

## 2020-09-28 ENCOUNTER — Ambulatory Visit (HOSPITAL_COMMUNITY)
Admission: RE | Admit: 2020-09-28 | Discharge: 2020-09-28 | Disposition: A | Discharge status: Home / Self Care | Payer: Medicare Other | Source: Ambulatory Visit | Attending: Urology | Admitting: Urology

## 2020-09-28 DIAGNOSIS — C61 Malignant neoplasm of prostate: Secondary | ICD-10-CM | POA: Diagnosis not present

## 2020-09-28 MED ORDER — PIFLIFOLASTAT F 18 (PYLARIFY) INJECTION
9.0000 | Freq: Once | INTRAVENOUS | Status: AC
  Administered 2020-09-28: 9.2 via INTRAVENOUS

## 2020-10-07 ENCOUNTER — Encounter: Payer: Self-pay | Admitting: Medical Oncology

## 2020-10-07 ENCOUNTER — Telehealth: Payer: Self-pay | Admitting: *Deleted

## 2020-10-07 NOTE — Progress Notes (Signed)
Spoke with patient to follow up on Lupron injection appointment @ [facility]. He states he went yesterday to get injection. When he arrived the orders were not placed and Dr.Dahlstedt in on vacation. His nurse is going to reschedule the appointment and call him. I asked him to call me if he has issues with getting the appointment. He voiced understanding. Ashlyn, [place] notified.

## 2020-10-07 NOTE — Telephone Encounter (Signed)
Called patient to ask questions, lvm for a return call

## 2020-10-14 ENCOUNTER — Telehealth: Payer: Self-pay | Admitting: *Deleted

## 2020-10-14 NOTE — Telephone Encounter (Signed)
CALLED PATIENT TO INFORM OF PRE-SEED APPTS. AND IMPLANT DATE, LVM FOR A RETURN CALL 

## 2020-10-16 ENCOUNTER — Encounter: Payer: Self-pay | Admitting: Cardiology

## 2020-10-16 ENCOUNTER — Telehealth: Payer: Self-pay | Admitting: *Deleted

## 2020-10-16 ENCOUNTER — Ambulatory Visit: Payer: Medicare Other | Admitting: Cardiology

## 2020-10-16 ENCOUNTER — Other Ambulatory Visit: Payer: Self-pay

## 2020-10-16 VITALS — BP 140/72 | HR 78 | Ht 70.0 in | Wt 255.8 lb

## 2020-10-16 DIAGNOSIS — U071 COVID-19: Secondary | ICD-10-CM

## 2020-10-16 DIAGNOSIS — E782 Mixed hyperlipidemia: Secondary | ICD-10-CM | POA: Diagnosis not present

## 2020-10-16 DIAGNOSIS — I2510 Atherosclerotic heart disease of native coronary artery without angina pectoris: Secondary | ICD-10-CM | POA: Diagnosis not present

## 2020-10-16 DIAGNOSIS — R600 Localized edema: Secondary | ICD-10-CM | POA: Diagnosis not present

## 2020-10-16 DIAGNOSIS — I10 Essential (primary) hypertension: Secondary | ICD-10-CM | POA: Diagnosis not present

## 2020-10-16 HISTORY — DX: COVID-19: U07.1

## 2020-10-16 NOTE — Progress Notes (Signed)
Clinical Summary Mr. Bosket is a 75 y.o.maleseen today for follow up of the following medical problems.    1. CAD   - hx of BMS to LAD in 2003   - cath 01/2011 with LM patent, LAD with prox stent mild ISR, patent mid stent, 90% mid LAD lesion. LCX 30% prox, OM2 30-40%, RCA small non-dom with no disease. Received additional stent to LAD at that time, a septal Kerianne Gurr was jailed. LVEF 65% by LV gram.   - exercise cardiolite from 08/2013 from prior cardiologist. 6 minutes with no ischemic changes, 87%THR, LVEF 58%, moderate size mild intensity partially reversible inferior defect thought to be soft tissue attenuation but cannot rule out RCA ischemia -[name] admit with chest pain 04/2014. Sharp pain mid to left chest, 10/10. No other associated symptoms. Not positional. Better with NG. Symptoms would last just a few minutes at at time. Lexiscan MPI during admission with no ischemia, LVEF 60%. 05/2014 cath at [place] patent coronaries  -01/2017 echo: LVEF 18-56%, grade I diastolic dysfunction     31/4970 nuclear stress: probable normal study, cannot exclude small distal inferior ischemia. Low risk     02/2020 nuclear stress: no significant ischemia  - no recent chest pain. No SOB/DOE - compliant wtith meds     2. LE swelling - ongoing swelling at times, resolves at night. Wears compressoin stockings.  - has had prior leg surgeries - swelling overall stable  - chronic swelling overall unchanged.        3. HTN - just took meds today - home sbp's 120s     4. Hyperlipidemia - he is compliant with statin - labs followed by pcp - LDL from last year was 69     5. COPD - night time oxygen. - followed by Dr Clydene Pugh    6. Femur fracture - admitted 03/2019 after fall - s/p surgery   7. Prostate cancer   SH: his wife Timon Geissinger is also a patient of mine Past Medical History:  Diagnosis Date   Arthritis    Asthma    Cancer ([place])    Prostate   COPD (chronic obstructive  pulmonary disease) ([place])    Coronary atherosclerosis of native coronary artery  04/22/2001      Cardiac Catheterization   Diverticulitis    Encounter for long-term (current) use of insulin ([facility])    Esophageal reflux    Gout, unspecified    Old myocardial infarction    2   Personal history of tobacco use, presenting hazards to health    Postsurgical percutaneous transluminal coronary angioplasty status    Prostate cancer ([place])    Pure hypercholesterolemia    Skin cancer    Sleep apnea    CPAP   Type II or unspecified type diabetes mellitus without mention of complication, not stated as uncontrolled    TYPE 11   Unspecified essential hypertension      Allergies  Allergen Reactions   Lisinopril Cough     Current Outpatient Medications  Medication Sig Dispense Refill   acarbose (PRECOSE) 100 MG tablet Take 100 mg by mouth 3 (three) times daily.     allopurinol (ZYLOPRIM) 300 MG tablet Take 300 mg by mouth daily.      amLODipine (NORVASC) 10 MG tablet Take 1 tablet (10 mg total) by mouth daily. (Patient taking differently: Take 10 mg by mouth at bedtime.) 30 tablet 6   aspirin EC 81 MG tablet Take 81 mg by mouth daily.  atorvastatin (LIPITOR) 80 MG tablet TAKE 1 TABLET (80 MG TOTAL) BY MOUTH DAILY. (STOP SIMVASTATIN) (Patient taking differently: Take 80 mg by mouth daily at 6 PM.) 30 tablet 6   budesonide-formoterol (SYMBICORT) 160-4.5 MCG/ACT inhaler Inhale 2 puffs into the lungs 2 (two) times daily.     carvedilol (COREG) 25 MG tablet Take 25 mg by mouth 2 (two) times daily with a meal.     fluticasone (FLONASE) 50 MCG/ACT nasal spray Place 1 spray into both nostrils daily.     gabapentin (NEURONTIN) 300 MG capsule Take 600 mg by mouth at bedtime.      ipratropium (ATROVENT) 0.03 % nasal spray Place 2 sprays into both nostrils See admin instructions. Use 2 sprays in each nostril every morning, may also use 2 sprays in each nostril at night as needed for congestion      isosorbide mononitrate (IMDUR) 30 MG 24 hr tablet TAKE 1 & 1/2 TABLETS (45MG ) BY MOUTH ONCE DAILY (Patient taking differently: Take 45 mg by mouth daily.) 135 tablet 3   levocetirizine (XYZAL) 5 MG tablet Take 5 mg by mouth every evening.     metFORMIN (GLUCOPHAGE-XR) 500 MG 24 hr tablet Take 500 mg by mouth 2 (two) times daily.     montelukast (SINGULAIR) 10 MG tablet Take 10 mg by mouth at bedtime.     Multiple Vitamin (MULTIVITAMIN WITH MINERALS) TABS tablet Take 1 tablet by mouth daily.      Naphazoline HCl (CLEAR EYES OP) Place 1 drop into both eyes daily. Clear eyes conplete     nitroGLYCERIN (NITROSTAT) 0.4 MG SL tablet Place 1 tablet (0.4 mg total) under the tongue every 5 (five) minutes as needed for chest pain. (Patient not taking: Reported on 08/28/2020) 25 tablet 3   ondansetron (ZOFRAN ODT) 4 MG disintegrating tablet Take 1 tablet (4 mg total) by mouth every 8 (eight) hours as needed for nausea or vomiting. 20 tablet 0   OXYGEN Inhale 2 L into the lungs at bedtime as needed (Copd).     OZEMPIC, 0.25 OR 0.5 MG/DOSE, 2 MG/1.5ML SOPN Inject 0.5 mg into the skin every Thursday.     pantoprazole (PROTONIX) 40 MG tablet Take 40 mg by mouth daily.     PRESCRIPTION MEDICATION Inhale into the lungs at bedtime. CPAP     torsemide (DEMADEX) 20 MG tablet Take 1 tablet (20 mg total) by mouth 2 (two) times daily. 180 tablet 0   valsartan (DIOVAN) 80 MG tablet Take 80 mg by mouth daily.     vitamin C (VITAMIN C) 1000 MG tablet Take 1 tablet (1,000 mg total) by mouth daily. 30 tablet 0   zolpidem (AMBIEN) 10 MG tablet Take 10 mg by mouth at bedtime as needed for sleep.     No current facility-administered medications for this visit.     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 Left dominent   CARDIAC CATHETERIZATION  01/26/2011   LAD: Patent  proximal stent. 90% calcified eccentic stenosis in med segment, mild LCX disease   CAROTID STENTS     CORONARY ANGIOPLASTY WITH STENT PLACEMENT  2003   Prox LAD: 3.0 X12 BMS   CORONARY ANGIOPLASTY WITH STENT PLACEMENT  01/26/2011   Mid LAD: 3.0 X15 mm Vision BMS. Complicated by a jailed septal Marv Alfrey and periprocedural MI  HEMORRHOID SURGERY     HERNIA REPAIR     HUMERUS FRACTURE SURGERY Left    ORIF FEMUR FRACTURE Left 03/19/2019   Procedure: OPEN REDUCTION INTERNAL FIXATION (ORIF) DISTAL FEMUR FRACTURE;  Surgeon: Altamese Flatonia, MD;  Location: [facility];  Service: Orthopedics;  Laterality: Left;   ORIF FEMUR FRACTURE Left 08/06/2019   Procedure: REPAIR DISTAL FEMUR NONUNION WITH INFUSE AND ALLOGRAFT;  Surgeon: Altamese [place], MD;  Location: [facility];  Service: Orthopedics;  Laterality: Left;     Allergies  Allergen Reactions   Lisinopril Cough      Family History  Problem Relation Age of Onset   Coronary artery disease Other        Family History   Breast cancer Neg Hx    Colon cancer Neg Hx    Prostate cancer Neg Hx    Pancreatic cancer Neg Hx      Social History Mr. Fitzgerald reports that he quit smoking about 35 years ago. His smoking use included cigarettes. He started smoking about 61 years ago. He has a 39.00 pack-year smoking history. He quit smokeless tobacco use about 35 years ago.  His smokeless tobacco use included snuff and chew. Mr. Bessinger reports current alcohol use.   Review of Systems CONSTITUTIONAL: No weight loss, fever, chills, weakness or fatigue.  HEENT: Eyes: No visual loss, blurred vision, double vision or yellow sclerae.No hearing loss, sneezing, congestion, runny nose or sore throat.  SKIN: No rash or itching.  CARDIOVASCULAR: per hpi RESPIRATORY: No shortness of breath, cough or sputum.  GASTROINTESTINAL: No anorexia, nausea, vomiting or diarrhea. No abdominal pain or blood.  GENITOURINARY: No burning on urination, no polyuria NEUROLOGICAL: No  headache, dizziness, syncope, paralysis, ataxia, numbness or tingling in the extremities. No change in bowel or bladder control.  MUSCULOSKELETAL: No muscle, back pain, joint pain or stiffness.  LYMPHATICS: No enlarged nodes. No history of splenectomy.  PSYCHIATRIC: No history of depression or anxiety.  ENDOCRINOLOGIC: No reports of sweating, cold or heat intolerance. No polyuria or polydipsia.  Marland Kitchen   Physical Examination Today's Vitals   10/16/20 1056  BP: 140/72  Pulse: 78  SpO2: 97%  Weight: 255 lb 12.8 oz (116 kg)  Height: 5\' 10"  (1.778 m)   Body mass index is 36.7 kg/m.  Gen: resting comfortably, no acute distress HEENT: no scleral icterus, pupils equal round and reactive, no palptable cervical adenopathy,  CV: RRR, no m/r/g, no jvd Resp: Clear to auscultation bilaterally GI: abdomen is soft, non-tender, non-distended, normal bowel sounds, no hepatosplenomegaly MSK: extremities are warm, no edema.  Skin: warm, no rash Neuro:  no focal deficits Psych: appropriate affect     Assessment and Plan  1. CAD - no recent symptoms - recent stress test without ischemia - continue current meds   2. LE edema - prior Echo with normal LVEF, grade I diasotlic dysfunction - stable, continue diuretic and compression   3. HTN - manual recheck 130/70, continue current meds   4. Hyperlipidemia - LDL at goal last year, request most recent labs - continue statin      Arnoldo Lenis, M.D.

## 2020-10-16 NOTE — Telephone Encounter (Signed)
RETURNED PATIENT'S WIFE'S PHONE CALL(PATRICIA Colleran), LVM FOR A RETURN CALL

## 2020-10-16 NOTE — Patient Instructions (Signed)
Medication Instructions:  Your physician recommends that you continue on your current medications as directed. Please refer to the Current Medication list given to you today.  *If you need a refill on your cardiac medications before your next appointment, please call your pharmacy*   Lab Work: We have requested your lab work from your primary care provider If you have labs (blood work) drawn today and your tests are completely normal, you will receive your results only by: [place] (if you have MyChart) OR A paper copy in the mail If you have any lab test that is abnormal or we need to change your treatment, we will call you to review the results.   Testing/Procedures: None   Follow-Up: At [facility], you and your health needs are our priority.  As part of our continuing mission to provide you with exceptional heart care, we have created designated Provider Care Teams.  These Care Teams include your primary Cardiologist (physician) and Advanced Practice Providers (APPs -  Physician Assistants and Nurse Practitioners) who all work together to provide you with the care you need, when you need it.  We recommend signing up for the patient portal called "MyChart".  Sign up information is provided on this After Visit Summary.  MyChart is used to connect with patients for Virtual Visits (Telemedicine).  Patients are able to view lab/test results, encounter notes, upcoming appointments, etc.  Non-urgent messages can be sent to your provider as well.   To learn more about what you can do with MyChart, go to [email or web address].    Your next appointment:   6 month(s)  The format for your next appointment:   In Person  Provider:   Carlyle Dolly, MD   Other Instructions

## 2020-10-27 ENCOUNTER — Other Ambulatory Visit: Payer: Self-pay | Admitting: Urology

## 2020-11-19 ENCOUNTER — Telehealth: Payer: Self-pay | Admitting: Cardiology

## 2020-11-19 NOTE — Telephone Encounter (Signed)
Dr. Harl Bowie  Can you please give ASA holding recs prior to this patient undergoing TKA revision 12/15/20? You recently saw him 10/16/20 and he was doing well with no anginal symptoms. He has a hx of multiple PCI/stent interventions dating back to 2003. Looks like he had a low risk stress test last year.   Please send your recommendations to the pre-op pool  Thank you

## 2020-11-19 NOTE — Telephone Encounter (Signed)
   [facility] Pre-operative Risk Assessment    Patient Name: [name]  DOB: 1945/05/12 MRN: [id number]  HEARTCARE STAFF:  - IMPORTANT!!!!!! Under Visit Info/Reason for Call, type in Other and utilize the format Clearance MM/DD/YY or Clearance TBD. Do not use dashes or single digits. - Please review there is not already an duplicate clearance open for this procedure. - If request is for dental extraction, please clarify the # of teeth to be extracted. - If the patient is currently at the dentist's office, call Pre-Op Callback Staff (MA/nurse) to input urgent request.  - If the patient is not currently in the dentist office, please route to the Pre-Op pool.  Request for surgical clearance:  What type of surgery is being performed? Revision of left TKA with removal of hardware and repair of iliotibial    When is this surgery scheduled? 12/15/20  What type of clearance is required (medical clearance vs. Pharmacy clearance to hold med vs. Both)? both  Are there any medications that need to be held prior to surgery and how long? Please advise   Practice name and name of physician performing surgery? Dr Elliot Cousin   What is the office phone number? [phone]    7.   What is the office fax number? [phone]  8.   Anesthesia type (None, local, MAC, general) ? choice   Jannet Askew 11/19/2020, 3:26 PM  _________________________________________________________________   (provider comments below)

## 2020-11-26 NOTE — Telephone Encounter (Signed)
If neccesary can hold aspirin prior to procedure, typically held 7 days prior and resumed day after  Zandra Abts MD

## 2020-11-26 NOTE — Telephone Encounter (Signed)
Reached out to patient to assess stability since last OV. Got VM - LMTCB. Alacia Rehmann PA-C

## 2020-11-26 NOTE — Telephone Encounter (Addendum)
   Patient Name: [name]  DOB: [birth year] MRN: [id number]  Primary Cardiologist: Carlyle Dolly, MD  Chart revisited as part of pre-operative protocol coverage. Returned pt's call. RCRI 6.6% due to CAD, IDDM (Lantus), indicating moderate CV risk with surgery. I reached out to patient for update on how he is doing. The patient affirms he has been doing well without any new cardiac symptoms, able to complete over 4 METS by DASI without concerning cardiac symptoms. Therefore, based on ACC/AHA guidelines, the patient would be at acceptable risk for the planned procedure without further cardiovascular testing. The patient was advised that if he develops new symptoms prior to surgery to contact our office to arrange for a follow-up visit, and he verbalized understanding.  Typically we would advise patients with CAD to remain on ASA if able. Per Dr. Harl Bowie, if neccesary can hold aspirin prior to procedure, typically held 7 days prior and resumed day after.  Will route this bundled recommendation to requesting provider via Epic fax function. Please call with questions.  Charlie Pitter, PA-C 11/26/2020, 5:08 PM

## 2020-12-01 NOTE — Patient Instructions (Addendum)
DUE TO COVID-19 ONLY ONE VISITOR IS ALLOWED TO COME WITH YOU AND STAY IN THE WAITING ROOM ONLY DURING PRE OP AND PROCEDURE.   **NO VISITORS ARE ALLOWED IN THE SHORT STAY AREA OR RECOVERY ROOM!!**  IF YOU WILL BE ADMITTED INTO THE HOSPITAL YOU ARE ALLOWED ONLY TWO SUPPORT PEOPLE DURING VISITATION HOURS ONLY (10AM -8PM)   The support person(s) may change daily. The support person(s) must pass our screening, gel in and out, and wear a mask at all times, including in the patient's room. Patients must also wear a mask when staff or their support person are in the room.  No visitors under the age of 48. Any visitor under the age of 82 must be accompanied by an adult.    COVID SWAB TESTING MUST BE COMPLETED ON:  Friday, 12-11-20 between the hours of 8 and 3  **MUST PRESENT COMPLETED FORM AT TESTING SITE**    [place] (backside of the building)  You are not required to quarantine, however you are required to wear a well-fitted mask when you are out and around people not in your household.  Hand Hygiene often Do NOT share personal items Notify your provider if you are in close contact with someone who has COVID or you develop fever 100.4 or greater, new onset of sneezing, cough, sore throat, shortness of breath or body aches.         Your procedure is scheduled on: Tuesday, 12-15-20   Report to [facility] Main  Entrance    Report to admitting at 1:15 PM   Call this number if you have problems the morning of surgery [phone]   Do not eat food :After Midnight.   May have liquids until 12:45 PM  day of surgery  CLEAR LIQUID DIET  Foods Allowed                                                                     Foods Excluded  Water, Black Coffee and tea, regular and decaf              liquids that you cannot  Plain Jell-O in any flavor  (No red)                                    see through such as: Fruit ices (not with fruit pulp)                                       milk, soups, orange juice              Iced Popsicles (No red)                                      All solid food                                   Apple juices Sports  drinks like Gatorade (No red) Lightly seasoned clear broth or consume(fat free) Sugar, honey syrup     Complete one G2 drink the morning of surgery at 12:45 PM  the day of surgery.        The day of surgery:  Drink ONE (1) G2 the morning of surgery. Drink in one sitting. Do not sip.  This drink was given to you during your hospital  pre-op appointment visit. Nothing else to drink after completing the G2.          If you have questions, please contact your surgeon's office.     Oral Hygiene is also important to reduce your risk of infection.                                    Remember - BRUSH YOUR TEETH THE MORNING OF SURGERY WITH YOUR REGULAR TOOTHPASTE   Do NOT smoke after Midnight   Take these medicines the morning of surgery with A SIP OF WATER: Allopurinol, Atorvastatin, Carvedilol, Isosorbide, Pantoprazole  How to Manage Your Diabetes Before and After Surgery  Why is it important to control my blood sugar before and after surgery? Improving blood sugar levels before and after surgery helps healing and can limit problems. A way of improving blood sugar control is eating a healthy diet by:  Eating less sugar and carbohydrates  Increasing activity/exercise  Talking with your doctor about reaching your blood sugar goals High blood sugars (greater than 180 mg/dL) can raise your risk of infections and slow your recovery, so you will need to focus on controlling your diabetes during the weeks before surgery. Make sure that the doctor who takes care of your diabetes knows about your planned surgery including the date and location.  How do I manage my blood sugar before surgery? Check your blood sugar at least 4 times a day, starting 2 days before surgery, to make sure that the level is not  too high or low. Check your blood sugar the morning of your surgery when you wake up and every 2 hours until you get to the Short Stay unit. If your blood sugar is less than 70 mg/dL, you will need to treat for low blood sugar: Do not take insulin. Treat a low blood sugar (less than 70 mg/dL) with  cup of clear juice (cranberry or apple), 4 glucose tablets, OR glucose gel. Recheck blood sugar in 15 minutes after treatment (to make sure it is greater than 70 mg/dL). If your blood sugar is not greater than 70 mg/dL on recheck, call [phone] for further instructions. Report your blood sugar to the short stay nurse when you get to Short Stay.  If you are admitted to the hospital after surgery: Your blood sugar will be checked by the staff and you will probably be given insulin after surgery (instead of oral diabetes medicines) to make sure you have good blood sugar levels. The goal for blood sugar control after surgery is 80-180 mg/dL.   WHAT DO I DO ABOUT MY DIABETES MEDICATION?  Do not take oral diabetes medicines (pills) the morning of surgery.         THE DAY BEFORE SURGERY:  Take Acarbose, Glipizide and Metformin as prescribed.  THE NIGHT BEFORE SURGERY:  Take 17 units of Insulin Glargine at bedtime  .       THE MORNING OF SURGERY:  Do not take Acarbose, Glipizide,  Metformin, Ozempic, Insulin Glargine   Reviewed and Endorsed by [facility] Patient Education Committee, August 2015                               You may not have any metal on your body including jewelry, and body piercing             Do not wear  lotions, powders, cologne, or deodorant              Men may shave face and neck.   Do not bring valuables to the hospital. [place].   Contacts, dentures or bridgework may not be worn into surgery.   Bring small overnight bag day of surgery.   Special Instructions: Bring a copy of your healthcare power of attorney and living  will documents         the day of surgery if you haven't scanned them in before.  Please read over the following fact sheets you were given: IF YOU HAVE QUESTIONS ABOUT YOUR PRE OP INSTRUCTIONS PLEASE CALL [phone]   [place] - Preparing for Surgery Before surgery, you can play an important role.  Because skin is not sterile, your skin needs to be as free of germs as possible.  You can reduce the number of germs on your skin by washing with CHG (chlorahexidine gluconate) soap before surgery.  CHG is an antiseptic cleaner which kills germs and bonds with the skin to continue killing germs even after washing. Please DO NOT use if you have an allergy to CHG or antibacterial soaps.  If your skin becomes reddened/irritated stop using the CHG and inform your nurse when you arrive at Short Stay. Do not shave (including legs and underarms) for at least 48 hours prior to the first CHG shower.  You may shave your face/neck.  Please follow these instructions carefully:  1.  Shower with CHG Soap the night before surgery and the  morning of surgery.  2.  If you choose to wash your hair, wash your hair first as usual with your normal  shampoo.  3.  After you shampoo, rinse your hair and body thoroughly to remove the shampoo.                             4.  Use CHG as you would any other liquid soap.  You can apply chg directly to the skin and wash.  Gently with a scrungie or clean washcloth.  5.  Apply the CHG Soap to your body ONLY FROM THE NECK DOWN.   Do   not use on face/ open                           Wound or open sores. Avoid contact with eyes, ears mouth and   genitals (private parts).                       Wash face,  Genitals (private parts) with your normal soap.             6.  Wash thoroughly, paying special attention to the area where your    surgery  will be performed.  7.  Thoroughly rinse your body with warm water from the neck down.  8.  DO NOT  shower/wash with your normal soap after  using and rinsing off the CHG Soap.                9.  Pat yourself dry with a clean towel.            10.  Wear clean pajamas.            11.  Place clean sheets on your bed the night of your first shower and do not  sleep with pets. Day of Surgery : Do not apply any lotions/deodorants the morning of surgery.  Please wear clean clothes to the hospital/surgery center.  FAILURE TO FOLLOW THESE INSTRUCTIONS MAY RESULT IN THE CANCELLATION OF YOUR SURGERY  PATIENT SIGNATURE_________________________________  NURSE SIGNATURE__________________________________  ________________________________________________________________________   [name]  An incentive spirometer is a tool that can help keep your lungs clear and active. This tool measures how well you are filling your lungs with each breath. Taking long deep breaths may help reverse or decrease the chance of developing breathing (pulmonary) problems (especially infection) following: A long period of time when you are unable to move or be active. BEFORE THE PROCEDURE  If the spirometer includes an indicator to show your best effort, your nurse or respiratory therapist will set it to a desired goal. If possible, sit up straight or lean slightly forward. Try not to slouch. Hold the incentive spirometer in an upright position. INSTRUCTIONS FOR USE  Sit on the edge of your bed if possible, or sit up as far as you can in bed or on a chair. Hold the incentive spirometer in an upright position. Breathe out normally. Place the mouthpiece in your mouth and seal your lips tightly around it. Breathe in slowly and as deeply as possible, raising the piston or the ball toward the top of the column. Hold your breath for 3-5 seconds or for as long as possible. Allow the piston or ball to fall to the bottom of the column. Remove the mouthpiece from your mouth and breathe out normally. Rest for a few seconds and repeat Steps 1 through 7 at least  10 times every 1-2 hours when you are awake. Take your time and take a few normal breaths between deep breaths. The spirometer may include an indicator to show your best effort. Use the indicator as a goal to work toward during each repetition. After each set of 10 deep breaths, practice coughing to be sure your lungs are clear. If you have an incision (the cut made at the time of surgery), support your incision when coughing by placing a pillow or rolled up towels firmly against it. Once you are able to get out of bed, walk around indoors and cough well. You may stop using the incentive spirometer when instructed by your caregiver.  RISKS AND COMPLICATIONS Take your time so you do not get dizzy or light-headed. If you are in pain, you may need to take or ask for pain medication before doing incentive spirometry. It is harder to take a deep breath if you are having pain. AFTER USE Rest and breathe slowly and easily. It can be helpful to keep track of a log of your progress. Your caregiver can provide you with a simple table to help with this. If you are using the spirometer at home, follow these instructions: [place] IF:  You are having difficultly using the spirometer. You have trouble using the spirometer as often as instructed. Your pain medication is not giving  enough relief while using the spirometer. You develop fever of 100.5 F (38.1 C) or higher. SEEK IMMEDIATE MEDICAL CARE IF:  You cough up bloody sputum that had not been present before. You develop fever of 102 F (38.9 C) or greater. You develop worsening pain at or near the incision site. MAKE SURE YOU:  Understand these instructions. Will watch your condition. Will get help right away if you are not doing well or get worse. Document Released: 08/15/2006 Document Revised: 06/27/2011 Document Reviewed: 10/16/2006 ExitCare Patient Information 2014 ExitCare,  [place].   ________________________________________________________________________  WHAT IS A BLOOD TRANSFUSION? Blood Transfusion Information  A transfusion is the replacement of blood or some of its parts. Blood is made up of multiple cells which provide different functions. Red blood cells carry oxygen and are used for blood loss replacement. White blood cells fight against infection. Platelets control bleeding. Plasma helps clot blood. Other blood products are available for specialized needs, such as hemophilia or other clotting disorders. BEFORE THE TRANSFUSION  Who gives blood for transfusions?  Healthy volunteers who are fully evaluated to make sure their blood is safe. This is blood bank blood. Transfusion therapy is the safest it has ever been in the practice of medicine. Before blood is taken from a donor, a complete history is taken to make sure that person has no history of diseases nor engages in risky social behavior (examples are intravenous drug use or sexual activity with multiple partners). The donor's travel history is screened to minimize risk of transmitting infections, such as malaria. The donated blood is tested for signs of infectious diseases, such as HIV and hepatitis. The blood is then tested to be sure it is compatible with you in order to minimize the chance of a transfusion reaction. If you or a relative donates blood, this is often done in anticipation of surgery and is not appropriate for emergency situations. It takes many days to process the donated blood. RISKS AND COMPLICATIONS Although transfusion therapy is very safe and saves many lives, the main dangers of transfusion include:  Getting an infectious disease. Developing a transfusion reaction. This is an allergic reaction to something in the blood you were given. Every precaution is taken to prevent this. The decision to have a blood transfusion has been considered carefully by your caregiver before blood is  given. Blood is not given unless the benefits outweigh the risks. AFTER THE TRANSFUSION Right after receiving a blood transfusion, you will usually feel much better and more energetic. This is especially true if your red blood cells have gotten low (anemic). The transfusion raises the level of the red blood cells which carry oxygen, and this usually causes an energy increase. The nurse administering the transfusion will monitor you carefully for complications. HOME CARE INSTRUCTIONS  No special instructions are needed after a transfusion. You may find your energy is better. Speak with your caregiver about any limitations on activity for underlying diseases you may have. SEEK MEDICAL CARE IF:  Your condition is not improving after your transfusion. You develop redness or irritation at the intravenous (IV) site. SEEK IMMEDIATE MEDICAL CARE IF:  Any of the following symptoms occur over the next 12 hours: Shaking chills. You have a temperature by mouth above 102 F (38.9 C), not controlled by medicine. Chest, back, or muscle pain. People around you feel you are not acting correctly or are confused. Shortness of breath or difficulty breathing. Dizziness and fainting. You get a rash or develop hives. You have  a decrease in urine output. Your urine turns a dark color or changes to pink, red, or brown. Any of the following symptoms occur over the next 10 days: You have a temperature by mouth above 102 F (38.9 C), not controlled by medicine. Shortness of breath. Weakness after normal activity. The white part of the eye turns yellow (jaundice). You have a decrease in the amount of urine or are urinating less often. Your urine turns a dark color or changes to pink, red, or brown. Document Released: 04/01/2000 Document Revised: 06/27/2011 Document Reviewed: 11/19/2007 [facility] Patient Information 2014 [place], [place].  _______________________________________________________________________

## 2020-12-02 ENCOUNTER — Other Ambulatory Visit (HOSPITAL_COMMUNITY): Payer: Self-pay

## 2020-12-02 ENCOUNTER — Telehealth: Payer: Self-pay | Admitting: *Deleted

## 2020-12-02 NOTE — Telephone Encounter (Signed)
CALLED PATIENT TO REMIND OF PRE-SEED APPTS. FOR 12-03-20, LVM FOR A RETURN CALL

## 2020-12-03 ENCOUNTER — Ambulatory Visit
Admission: RE | Admit: 2020-12-03 | Discharge: 2020-12-03 | Disposition: A | Discharge status: Home / Self Care | Payer: Medicare Other | Source: Ambulatory Visit | Attending: Radiation Oncology | Admitting: Radiation Oncology

## 2020-12-03 ENCOUNTER — Ambulatory Visit (HOSPITAL_COMMUNITY)
Admission: RE | Admit: 2020-12-03 | Discharge: 2020-12-03 | Disposition: A | Discharge status: Home / Self Care | Payer: Medicare Other | Source: Ambulatory Visit | Attending: Urology | Admitting: Urology

## 2020-12-03 ENCOUNTER — Encounter (HOSPITAL_COMMUNITY)
Admission: RE | Admit: 2020-12-03 | Discharge: 2020-12-03 | Disposition: A | Discharge status: Home / Self Care | Payer: Medicare Other | Source: Ambulatory Visit | Attending: Urology | Admitting: Urology

## 2020-12-03 ENCOUNTER — Encounter (HOSPITAL_COMMUNITY)
Admission: RE | Admit: 2020-12-03 | Discharge: 2020-12-03 | Disposition: A | Discharge status: Home / Self Care | Payer: Medicare Other | Source: Ambulatory Visit | Attending: Orthopedic Surgery | Admitting: Orthopedic Surgery

## 2020-12-03 ENCOUNTER — Ambulatory Visit
Admission: RE | Admit: 2020-12-03 | Discharge: 2020-12-03 | Disposition: A | Discharge status: Home / Self Care | Payer: Medicare Other | Source: Ambulatory Visit | Attending: Urology | Admitting: Urology

## 2020-12-03 ENCOUNTER — Encounter: Payer: Self-pay | Admitting: Urology

## 2020-12-03 ENCOUNTER — Other Ambulatory Visit: Payer: Self-pay

## 2020-12-03 ENCOUNTER — Encounter (HOSPITAL_COMMUNITY): Payer: Self-pay

## 2020-12-03 DIAGNOSIS — Z01812 Encounter for preprocedural laboratory examination: Secondary | ICD-10-CM | POA: Insufficient documentation

## 2020-12-03 DIAGNOSIS — M9712XA Periprosthetic fracture around internal prosthetic left knee joint, initial encounter: Secondary | ICD-10-CM | POA: Diagnosis not present

## 2020-12-03 DIAGNOSIS — G4733 Obstructive sleep apnea (adult) (pediatric): Secondary | ICD-10-CM | POA: Diagnosis not present

## 2020-12-03 DIAGNOSIS — I10 Essential (primary) hypertension: Secondary | ICD-10-CM | POA: Diagnosis not present

## 2020-12-03 DIAGNOSIS — E119 Type 2 diabetes mellitus without complications: Secondary | ICD-10-CM | POA: Diagnosis not present

## 2020-12-03 DIAGNOSIS — I2510 Atherosclerotic heart disease of native coronary artery without angina pectoris: Secondary | ICD-10-CM | POA: Diagnosis not present

## 2020-12-03 DIAGNOSIS — C61 Malignant neoplasm of prostate: Secondary | ICD-10-CM | POA: Insufficient documentation

## 2020-12-03 DIAGNOSIS — Z01818 Encounter for other preprocedural examination: Secondary | ICD-10-CM | POA: Insufficient documentation

## 2020-12-03 DIAGNOSIS — Z7984 Long term (current) use of oral hypoglycemic drugs: Secondary | ICD-10-CM | POA: Insufficient documentation

## 2020-12-03 DIAGNOSIS — J449 Chronic obstructive pulmonary disease, unspecified: Secondary | ICD-10-CM | POA: Insufficient documentation

## 2020-12-03 DIAGNOSIS — Z510 Encounter for antineoplastic radiation therapy: Secondary | ICD-10-CM | POA: Insufficient documentation

## 2020-12-03 DIAGNOSIS — Z79899 Other long term (current) drug therapy: Secondary | ICD-10-CM | POA: Diagnosis not present

## 2020-12-03 HISTORY — DX: Personal history of urinary calculi: Z87.442

## 2020-12-03 LAB — CBC
HCT: 36.3 % — ABNORMAL LOW (ref 39.0–52.0)
Hemoglobin: 12.3 g/dL — ABNORMAL LOW (ref 13.0–17.0)
MCH: 32.8 pg (ref 26.0–34.0)
MCHC: 33.9 g/dL (ref 30.0–36.0)
MCV: 96.8 fL (ref 80.0–100.0)
Platelets: 160 10*3/uL (ref 150–400)
RBC: 3.75 MIL/uL — ABNORMAL LOW (ref 4.22–5.81)
RDW: 13.7 % (ref 11.5–15.5)
WBC: 9 10*3/uL (ref 4.0–10.5)
nRBC: 0 % (ref 0.0–0.2)

## 2020-12-03 LAB — SURGICAL PCR SCREEN
MRSA, PCR: POSITIVE — AB
Staphylococcus aureus: POSITIVE — AB

## 2020-12-03 LAB — COMPREHENSIVE METABOLIC PANEL
ALT: 19 U/L (ref 0–44)
AST: 22 U/L (ref 15–41)
Albumin: 3.9 g/dL (ref 3.5–5.0)
Alkaline Phosphatase: 88 U/L (ref 38–126)
Anion gap: 7 (ref 5–15)
BUN: 17 mg/dL (ref 8–23)
CO2: 26 mmol/L (ref 22–32)
Calcium: 9.1 mg/dL (ref 8.9–10.3)
Chloride: 106 mmol/L (ref 98–111)
Creatinine, Ser: 1.31 mg/dL — ABNORMAL HIGH (ref 0.61–1.24)
GFR, Estimated: 57 mL/min — ABNORMAL LOW (ref >60)
Glucose, Bld: 115 mg/dL — ABNORMAL HIGH (ref 70–99)
Potassium: 3.9 mmol/L (ref 3.5–5.1)
Sodium: 139 mmol/L (ref 135–145)
Total Bilirubin: 0.7 mg/dL (ref 0.3–1.2)
Total Protein: 7.4 g/dL (ref 6.5–8.1)

## 2020-12-03 LAB — HEMOGLOBIN A1C
Hgb A1c MFr Bld: 7.2 % — ABNORMAL HIGH (ref 4.8–5.6)
Mean Plasma Glucose: 159.94 mg/dL

## 2020-12-03 LAB — GLUCOSE, CAPILLARY: Glucose-Capillary: 139 mg/dL — ABNORMAL HIGH (ref 70–99)

## 2020-12-03 NOTE — Progress Notes (Addendum)
PC[facility] - Dr. Marvetta [facility]bons [facility]  Cardiologist - Dr. Carlyle Dolly [facility] 12-15-20 epic  PPM/ICD -  Device Orders -  Rep Notified -   Chest x-ray -  EKG - 02-21-20 epic Stress Test -  ECHO -  Cardiac Cath -   Sleep Study -  CPAP -   Fasting Blood Sugar - 120-140's Checks Blood Sugar __1___ times a day  Blood Thinner Instructions: Aspirin Instructions:81 mg asa  stop 7 days before  ERAS Protcol - PRE-SURGERY Ensure or G2-   [place] vaccinated 2 shots Pfizer COVID TEST- 12-11-20  Activity--able to walk around home without SOB limited due to knee Anesthesia review: DM HTN CAD stent x 3  Patient denies shortness of breath, fever, cough and chest pain at PAT appointment   All instructions explained to the patient, with a verbal understanding of the material. Patient agrees to go over the instructions while at home for a better understanding. Patient also instructed to self quarantine after being tested for COVID-19. The opportunity to ask questions was provided.

## 2020-12-03 NOTE — Progress Notes (Signed)
  Radiation Oncology         [phone] ________________________________  Name: [name] MRN: [id number]  Date: 12/03/2020  DOB: 11-02-1945  SIMULATION AND TREATMENT PLANNING NOTE PUBIC ARCH STUDY  [id number], [place] Fossa, MD  Franchot Gallo, MD  DIAGNOSIS: 75 y.o. 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 and a PSA of 5.5  Oncology History  Malignant neoplasm of prostate ([place])  07/24/2020 Cancer Staging   Staging form: Prostate, AJCC 8th Edition - Clinical stage from 07/24/2020: Stage IIIC (cT2c, cN0, cM0, PSA: 5.5, Grade Group: 5) - Signed by Freeman Caldron, PA-C on 08/31/2020 Histopathologic type: Adenocarcinoma, NOS Stage prefix: Initial diagnosis Prostate specific antigen (PSA) range: Less than 10 Gleason primary pattern: 4 Gleason secondary pattern: 5 Gleason score: 9 Histologic grading system: 5 grade system Number of biopsy cores examined: 12 Number of biopsy cores positive: 7 Location of positive needle core biopsies: Both sides   08/28/2020 Initial Diagnosis   Prostate cancer ([place])       ICD-10-CM   1. Malignant neoplasm of prostate ([place])  C61       COMPLEX SIMULATION:  The patient presented today for evaluation for possible prostate seed implant. He was brought to the radiation planning suite and placed supine on the CT couch. A 3-dimensional image study set was obtained in upload to the planning computer. There, on each axial slice, I contoured the prostate gland. Then, using three-dimensional radiation planning tools I reconstructed the prostate in view of the structures from the transperineal needle pathway to assess for possible pubic arch interference. In doing so, I did not appreciate any pubic arch interference. Also, the patient's prostate volume was estimated based on the drawn structure. The volume was 37.8 cc.  Given the pubic arch appearance and prostate volume, patient remains a good candidate to proceed  with prostate seed implant. Today, he freely provided informed written consent to proceed.    PLAN: The patient will undergo prostate seed implant boost.   ________________________________  Sheral Apley. Tammi Klippel, M.D.

## 2020-12-04 NOTE — Progress Notes (Signed)
Anesthesia Chart Review:   Case: [id number] Date/Time: 12/15/20 1530   Procedure: LEFT TOTAL KNEE REVISION WITH REMOVAL OF HARDWARE AND REPAIR OF ILIOTIBIAL BAND OF MID THIGH (Left: Knee)   Anesthesia type: Choice   Pre-op diagnosis: Left distal periprosthetic fracture with malunion   Location: [facility] ROOM 09 / [facility]   Surgeons: Paralee Cancel, MD       DISCUSSION: Pt is 75 years old with hx CAD (BMS to LAD 2003; stent to LAD x2 2012), HTN, DM, asthma, COPD, OSA  Reviewed CXR with Dr. Daiva Huge. At presurgical testing 12/03/20, pt denies sx of SOB, cough, or fever  VS: BP 150/75, HR 70, RR 18, Temp 36.7C, SpO2 98%  PROVIDERS: - PCP is Tobe Sos, MD - cardiologist is Carlyle Dolly, MD. Last office visit 10/16/20. Cleared for surgery 11/26/20 by Melina Copa, PA.    LABS: all from 12/03/20. Abnormal results only listed below. Acceptable for surgery - HbA1c 7.2 - CMP: Cr 1.31, glucose 115 - CBC: H/H 12.3/36.3  IMAGES:  CXR 12/03/20:  - Mild cardiomegaly, without acute disease. - Mild interstitial lung disease, grossly similar to the 10/18/2018 chest CT. Favor nonspecific interstitial pneumonia.  CT 06/28/20:  1. Mild pulmonary fibrosis remains without interval change. No honeycombing. 2. Minimal paraseptal emphysema. 3. Stable, benign small pulmonary nodules. 4. Atherosclerotic changes in the thoracic aorta. Three-vessel coronary artery disease.   EKG 03/02/20: NSR   CV: Nuclear stress test 02/28/20:  No diagnostic ST segment changes to indicate ischemia. Small, mild intensity, apical to basal inferior defect that is fixed and most consistent with soft tissue attenuation rather than scar in light of normal wall motion. No significant ischemic territories noted. This is a low risk study. Nuclear stress EF: 63%.  Echo 01/25/17:  - Left ventricle: The cavity size was mildly dilated. Wall thickness was normal. Systolic function was normal. The estimated ejection fraction was in  the range of 55% to 60%. Wall motion was normal; there were no regional wall motion abnormalities. Doppler parameters are consistent with abnormal left ventricular relaxation (grade 1 diastolic dysfunction). Doppler parameters are consistent with high ventricular filling pressure.  - Mitral valve: There was mild regurgitation.  Cardiac cath 05/29/14 (at [facility]; found results in 05/29/14 admission note in care everywhere):  Diagnostic Findings Coronary arteries Dominance: left Left main: insignificant LAD: insignificant LCx: insignificant RCA: not injected Left Ventricle EF: 55%. Wall Motion:Normal Impressions 1. Right radial 2. LAD wih mild - moderate atherosclerosis - 40% in-stent 3. LCX dominant with 20-30% 4. RCA not injected   Past Medical History:  Diagnosis Date   Arthritis    Asthma    Cancer ([place])    Prostate   COPD (chronic obstructive pulmonary disease) ([place])    Coronary atherosclerosis of native coronary artery 04/22/2001      Cardiac Catheterization   Diverticulitis    Encounter for long-term (current) use of insulin (HCC)    Esophageal reflux    Gout, unspecified    History of kidney stones    Old myocardial infarction    2   Personal history of tobacco use, presenting hazards to health    Postsurgical percutaneous transluminal coronary angioplasty status    Prostate cancer ([place])    Pure hypercholesterolemia    Skin cancer    Sleep apnea    CPAP  also o2 at hs at times when not wearing cpap   Type II or unspecified type diabetes mellitus without mention of complication, not stated as uncontrolled  TYPE 11   Unspecified essential hypertension     Past Surgical History:  Procedure Laterality Date   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 Left dominent   CARDIAC CATHETERIZATION  01/26/2011   LAD: Patent proximal stent. 90% calcified eccentic stenosis in  med segment, mild LCX disease   CAROTID STENTS     CORONARY ANGIOPLASTY WITH STENT PLACEMENT  2003   Prox LAD: 3.0 X12 BMS   CORONARY ANGIOPLASTY WITH STENT PLACEMENT  01/26/2011   Mid LAD: 3.0 X15 mm Vision BMS. Complicated by a jailed septal branch and periprocedural MI   EYE SURGERY     laser for bleed   HEMORRHOID SURGERY     HERNIA REPAIR     HUMERUS FRACTURE SURGERY Left    NASAL SINUS SURGERY     ORIF FEMUR FRACTURE Left 03/19/2019   Procedure: OPEN REDUCTION INTERNAL FIXATION (ORIF) DISTAL FEMUR FRACTURE;  Surgeon: Altamese Ouachita, MD;  Location: [place];  Service: Orthopedics;  Laterality: Left;   ORIF FEMUR FRACTURE Left 08/06/2019   Procedure: REPAIR DISTAL FEMUR NONUNION WITH INFUSE AND ALLOGRAFT;  Surgeon: Altamese Jansen, MD;  Location: [place];  Service: Orthopedics;  Laterality: Left;    MEDICATIONS:  acarbose (PRECOSE) 100 MG tablet   allopurinol (ZYLOPRIM) 300 MG tablet   amLODipine (NORVASC) 10 MG tablet   aspirin EC 81 MG tablet   atorvastatin (LIPITOR) 80 MG tablet   budesonide-formoterol (SYMBICORT) 160-4.5 MCG/ACT inhaler   carvedilol (COREG) 25 MG tablet   fluticasone (FLONASE) 50 MCG/ACT nasal spray   gabapentin (NEURONTIN) 300 MG capsule   glipiZIDE (GLUCOTROL XL) 5 MG 24 hr tablet   insulin glargine (LANTUS) 100 UNIT/ML injection   isosorbide mononitrate (IMDUR) 30 MG 24 hr tablet   levocetirizine (XYZAL) 5 MG tablet   metFORMIN (GLUCOPHAGE-XR) 500 MG 24 hr tablet   montelukast (SINGULAIR) 10 MG tablet   Multiple Vitamin (MULTIVITAMIN WITH MINERALS) TABS tablet   nitroGLYCERIN (NITROSTAT) 0.4 MG SL tablet   ondansetron (ZOFRAN ODT) 4 MG disintegrating tablet   OXYGEN   OZEMPIC, 0.25 OR 0.5 MG/DOSE, 2 MG/1.5ML SOPN   pantoprazole (PROTONIX) 40 MG tablet   PRESCRIPTION MEDICATION   torsemide (DEMADEX) 20 MG tablet   valsartan (DIOVAN) 80 MG tablet   vitamin C (VITAMIN C) 1000 MG tablet   zolpidem (AMBIEN) 10 MG tablet   No current facility-administered  medications for this encounter.    If no changes, I anticipate pt can proceed with surgery as scheduled.   Willeen Cass, PhD, FNP-BC [facility]/Anesthesiology Phone: [phone] 12/04/2020 3:03 PM

## 2020-12-04 NOTE — Anesthesia Preprocedure Evaluation (Addendum)
Anesthesia Evaluation  Patient identified by MRN, date of birth, ID band Patient awake    Reviewed: Allergy & Precautions, NPO status , Patient's Chart, lab work & pertinent test results, reviewed documented beta blocker date and time   Airway Mallampati: II  TM Distance: >3 FB Neck ROM: Full    Dental  (+) Dental Advisory Given, Edentulous Lower, Edentulous Upper   Pulmonary asthma , sleep apnea and Continuous Positive Airway Pressure Ventilation , COPD,  COPD inhaler and oxygen dependent, former smoker,    Pulmonary exam normal breath sounds clear to auscultation       Cardiovascular hypertension, Pt. on medications and Pt. on home beta blockers + CAD, + Past MI and + Cardiac Stents  Normal cardiovascular exam Rhythm:Regular Rate:Normal     Neuro/Psych negative neurological ROS  negative psych ROS   GI/Hepatic Neg liver ROS, GERD  Medicated,  Endo/Other  diabetes, Type 2, Oral Hypoglycemic Agents, Insulin DependentObesity   Renal/GU negative Renal ROS     Musculoskeletal  (+) Arthritis , Osteoarthritis,    Abdominal   Peds  Hematology  (+) Blood dyscrasia, anemia , Plt 160k   Anesthesia Other Findings Day of surgery medications reviewed with the patient.  Reproductive/Obstetrics                           Anesthesia Physical Anesthesia Plan  ASA: 3  Anesthesia Plan: Spinal   Post-op Pain Management:  Regional for Post-op pain   Induction: Intravenous  PONV Risk Score and Plan: 2 and Dexamethasone, Ondansetron and Treatment may vary due to age or medical condition  Airway Management Planned: Nasal Cannula and Natural Airway  Additional Equipment:   Intra-op Plan:   Post-operative Plan:   Informed Consent: I have reviewed the patients History and Physical, chart, labs and discussed the procedure including the risks, benefits and alternatives for the proposed anesthesia with the  patient or authorized representative who has indicated his/her understanding and acceptance.     Dental advisory given  Plan Discussed with: CRNA, Anesthesiologist and Surgeon  Anesthesia Plan Comments: (See APP note by Durel Salts, FNP )       Anesthesia Quick Evaluation

## 2020-12-11 ENCOUNTER — Other Ambulatory Visit: Payer: Self-pay | Admitting: Orthopedic Surgery

## 2020-12-11 LAB — SARS CORONAVIRUS 2 (TAT 6-24 HRS): SARS Coronavirus 2: NEGATIVE

## 2020-12-13 NOTE — H&P (Signed)
TOTAL KNEE REVISION ADMISSION H&P  Patient is being admitted for left revision total knee arthroplasty.  Subjective:  Chief Complaint:left knee pain.  HPI: [name], 75 y.o. male,  who presents with symptoms in the left knee including pain which has impacted their quality of life and ability to do activities of daily living. He has a history of a left total knee replacement that was performed in 2015 at a [facility] facility in [place]. In December 2020 after a fall he sustained a distal periprosthetic femur fracture treated with open reduction internal fixation by Dr. Marcelino Scot. Dr. Marcelino Scot is also performed a bone grafting procedure to try to help with bone healing. He was referred from Dr. Marcelino Scot to Dr. Alvan Dame for revision surgery. The patient denies an active infection. There is no current active infection.  Patient Active Problem List   Diagnosis Date Noted   Malignant neoplasm of prostate ([place]) 08/28/2020   Diabetes ([place]) 03/20/2019   Periprosthetic supracondylar fracture of femur, left 03/18/2019   Pure hypercholesterolemia    Essential hypertension    Coronary atherosclerosis of native coronary artery 04/22/2001   Past Medical History:  Diagnosis Date   Arthritis    Asthma    Cancer ([place])    Prostate   COPD (chronic obstructive pulmonary disease) ([place])    Coronary atherosclerosis of native coronary artery 04/22/2001      Cardiac Catheterization   Diverticulitis    Encounter for long-term (current) use of insulin (HCC)    Esophageal reflux    Gout, unspecified    History of kidney stones    Old myocardial infarction    2   Personal history of tobacco use, presenting hazards to health    Postsurgical percutaneous transluminal coronary angioplasty status    Prostate cancer ([place])    Pure hypercholesterolemia    Skin cancer    Sleep apnea    CPAP  also o2 at hs at times when not wearing cpap   Type II or unspecified type diabetes mellitus without mention of complication, not  stated as uncontrolled    TYPE 11   Unspecified essential hypertension     Past Surgical History:  Procedure Laterality Date   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 Left dominent   CARDIAC CATHETERIZATION  01/26/2011   LAD: Patent proximal stent. 90% calcified eccentic stenosis in med segment, mild LCX disease   CAROTID STENTS     CORONARY ANGIOPLASTY WITH STENT PLACEMENT  2003   Prox LAD: 3.0 X12 BMS   CORONARY ANGIOPLASTY WITH STENT PLACEMENT  01/26/2011   Mid LAD: 3.0 X15 mm Vision BMS. Complicated by a jailed septal branch and periprocedural MI   EYE SURGERY     laser for bleed   HEMORRHOID SURGERY     HERNIA REPAIR     HUMERUS FRACTURE SURGERY Left    NASAL SINUS SURGERY     ORIF FEMUR FRACTURE Left 03/19/2019   Procedure: OPEN REDUCTION INTERNAL FIXATION (ORIF) DISTAL FEMUR FRACTURE;  Surgeon: Altamese Stafford, MD;  Location: [place];  Service: Orthopedics;  Laterality: Left;   ORIF FEMUR FRACTURE Left 08/06/2019   Procedure: REPAIR DISTAL FEMUR NONUNION WITH INFUSE AND ALLOGRAFT;  Surgeon: Altamese San Simeon, MD;  Location: [place];  Service: Orthopedics;  Laterality: Left;    No current facility-administered medications for this encounter.   Current Outpatient Medications  Medication  Sig Dispense Refill Last Dose   acarbose (PRECOSE) 100 MG tablet Take 100 mg by mouth 3 (three) times daily.      allopurinol (ZYLOPRIM) 300 MG tablet Take 300 mg by mouth daily.       amLODipine (NORVASC) 10 MG tablet Take 1 tablet (10 mg total) by mouth daily. (Patient taking differently: Take 10 mg by mouth at bedtime.) 30 tablet 6    aspirin EC 81 MG tablet Take 81 mg by mouth daily.       atorvastatin (LIPITOR) 80 MG tablet TAKE 1 TABLET (80 MG TOTAL) BY MOUTH DAILY. (STOP SIMVASTATIN) 30 tablet 6    budesonide-formoterol (SYMBICORT) 160-4.5 MCG/ACT inhaler Inhale 2 puffs into the lungs daily.       carvedilol (COREG) 25 MG tablet Take 25 mg by mouth 2 (two) times daily with a meal.      fluticasone (FLONASE) 50 MCG/ACT nasal spray Place 1 spray into both nostrils in the morning and at bedtime.      gabapentin (NEURONTIN) 300 MG capsule Take 600 mg by mouth at bedtime.       glipiZIDE (GLUCOTROL XL) 5 MG 24 hr tablet Take 5 mg by mouth every morning.      insulin glargine (LANTUS) 100 UNIT/ML injection Inject 35 Units into the skin at bedtime.      isosorbide mononitrate (IMDUR) 30 MG 24 hr tablet TAKE 1 & 1/2 TABLETS ('45MG'$ ) BY MOUTH ONCE DAILY (Patient taking differently: Take 45 mg by mouth daily.) 135 tablet 3    levocetirizine (XYZAL) 5 MG tablet Take 5 mg by mouth every evening.      metFORMIN (GLUCOPHAGE-XR) 500 MG 24 hr tablet Take 500 mg by mouth 2 (two) times daily.      montelukast (SINGULAIR) 10 MG tablet Take 10 mg by mouth at bedtime.      Multiple Vitamin (MULTIVITAMIN WITH MINERALS) TABS tablet Take 1 tablet by mouth daily.       nitroGLYCERIN (NITROSTAT) 0.4 MG SL tablet Place 1 tablet (0.4 mg total) under the tongue every 5 (five) minutes as needed for chest pain. 25 tablet 3    OXYGEN Inhale 2 L into the lungs 3 (three) times daily as needed (shortness of breath).      OZEMPIC, 0.25 OR 0.5 MG/DOSE, 2 MG/1.5ML SOPN Inject 0.5 mg into the skin every Thursday.      pantoprazole (PROTONIX) 40 MG tablet Take 40 mg by mouth daily.      torsemide (DEMADEX) 20 MG tablet Take 1 tablet (20 mg total) by mouth 2 (two) times daily. 180 tablet 0    valsartan (DIOVAN) 80 MG tablet Take 80 mg by mouth daily.      vitamin C (VITAMIN C) 1000 MG tablet Take 1 tablet (1,000 mg total) by mouth daily. 30 tablet 0    zolpidem (AMBIEN) 10 MG tablet Take 10 mg by mouth at bedtime as needed for sleep.      ondansetron (ZOFRAN ODT) 4 MG disintegrating tablet Take 1 tablet (4 mg total) by mouth every 8 (eight) hours as needed for nausea or vomiting. (Patient not taking: No sig reported) 20 tablet 0  Not Taking   PRESCRIPTION MEDICATION Inhale into the lungs at bedtime. CPAP      Allergies  Allergen Reactions   Lisinopril Cough    Social History   Tobacco Use   Smoking status: Former    Packs/day: 1.50    Years: 26.00    Pack years: 39.00  Types: Cigarettes    Start date: 07/16/1959    Quit date: 04/18/1985    Years since quitting: 35.6   Smokeless tobacco: Former    Types: Snuff, Sarina Ser    Quit date: 04/18/1985  Substance Use Topics   Alcohol use: Yes    Alcohol/week: 0.0 standard drinks    Comment: Occasionally    Family History  Problem Relation Age of Onset   Coronary artery disease Other        Family History   Breast cancer Neg Hx    Colon cancer Neg Hx    Prostate cancer Neg Hx    Pancreatic cancer Neg Hx       Review of Systems  Constitutional:  Negative for chills and fever.  Respiratory:  Negative for cough and shortness of breath.   Cardiovascular:  Negative for chest pain.  Gastrointestinal:  Negative for nausea and vomiting.  Musculoskeletal:  Positive for arthralgias.    Objective:  Physical Exam Well nourished and well developed. General: Alert and oriented x3, cooperative and pleasant, no acute distress. Head: normocephalic, atraumatic, neck supple. Eyes: EOMI.  Musculoskeletal: Left knee exam: Healed surgical incisions over the anterior aspect of his knee as well as laterally from his hardware placement. No evidence of any infection concerns. No effusion, warmth or erythema Tenderness to palpation about his knee No extensor lag but 5 degree flexion contracture noted, no palpable defects He flexes with pain at about 80 to 90 degrees. Knee ligaments are stable medially and laterally Mild lower extremity edema but no erythema  Calves soft and nontender. Motor function intact in LE. Strength 5/5 LE bilaterally. Neuro: Distal pulses 2+. Sensation to light touch intact in LE.  Vital signs in last 24 hours:    Labs:  Estimated body mass  index is 35.73 kg/m as calculated from the following:   Height as of 12/03/20: '5\' 10"'$  (1.778 m).   Weight as of 12/03/20: 112.9 kg.  Imaging Review Plain radiographs demonstrate what appear to be stable well fixed femoral and tibial components from his knee replacement with previously placed hardware and malunion of the distal femur with some medial shift compared to the shaft. There does appear to be fracture callus present    Assessment/Plan:   left knee(s) with failed previous arthroplasty.   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 Revision total knee arthroplasty is deemed medically necessary. The treatment options including medical management, injection therapy, arthroscopy and revision arthroplasty were discussed at length. The risks and benefits of revision total knee arthroplasty were presented and reviewed. The risks due to aseptic loosening, infection, stiffness, patella tracking problems, thromboembolic complications and other imponderables were discussed. The patient acknowledged the explanation, agreed to proceed with the plan and consent was signed. Patient is being admitted for inpatient treatment for surgery, pain control, PT, OT, prophylactic antibiotics, VTE prophylaxis, progressive ambulation and ADL's and discharge planning.The patient is planning to be discharged  home.  Therapy Plans: home with HHPT vs SNF Disposition: Home with wife Planned DVT Prophylaxis: Xarelto x2 weeks, then ASA x4 weeks DME needed: none PCP: Dr. Shon Millet, saw & has verbal clearance Cardiologist: Dr. Harl Bowie, clearance received Oncologist: Dr. Beatrix Fetters, Prostate CA TXA: IV Allergies: lisinopril - itching Anesthesia Concerns: nausea BMI: 35.4 Last HgbA1c: 7%  Other: - Has active prostate CA - getting radioactive seeds put in on  12/28/20 -  Oxycodone, tylenol, robaxin   Griffith Citron, PA-C Orthopedic Surgery [facility] [place] [phone]

## 2020-12-14 MED ORDER — VANCOMYCIN HCL 1500 MG/300ML IV SOLN
1500.0000 mg | Freq: Once | INTRAVENOUS | Status: AC
  Administered 2020-12-15: 1500 mg via INTRAVENOUS
  Filled 2020-12-14: qty 300

## 2020-12-14 NOTE — Progress Notes (Signed)
Pt aware to arrive at [facility] admitting dept at 3 pm on Tuesday 12/15/2020. No food afther midnight; clear liquids from midnight till 2:30 pm consuming entire presurgery drink by 2:30 pm then nothing by mouth.

## 2020-12-15 ENCOUNTER — Inpatient Hospital Stay (HOSPITAL_COMMUNITY): Payer: Medicare Other | Admitting: Anesthesiology

## 2020-12-15 ENCOUNTER — Encounter (HOSPITAL_COMMUNITY)
Admission: RE | Disposition: A | Discharge status: Skilled Nursing Facility | Payer: Self-pay | Source: Home / Self Care | Attending: Orthopedic Surgery

## 2020-12-15 ENCOUNTER — Inpatient Hospital Stay (HOSPITAL_COMMUNITY)
Admission: RE | Admit: 2020-12-15 | Discharge: 2020-12-18 | DRG: 467 | Disposition: A | Discharge status: Skilled Nursing Facility | Payer: Medicare Other | Attending: Orthopedic Surgery | Admitting: Orthopedic Surgery

## 2020-12-15 ENCOUNTER — Encounter (HOSPITAL_COMMUNITY): Payer: Self-pay | Admitting: Orthopedic Surgery

## 2020-12-15 ENCOUNTER — Inpatient Hospital Stay (HOSPITAL_COMMUNITY): Payer: Medicare Other | Admitting: Emergency Medicine

## 2020-12-15 DIAGNOSIS — G4730 Sleep apnea, unspecified: Secondary | ICD-10-CM | POA: Diagnosis present

## 2020-12-15 DIAGNOSIS — Z888 Allergy status to other drugs, medicaments and biological substances status: Secondary | ICD-10-CM

## 2020-12-15 DIAGNOSIS — Y792 Prosthetic and other implants, materials and accessory orthopedic devices associated with adverse incidents: Secondary | ICD-10-CM | POA: Diagnosis present

## 2020-12-15 DIAGNOSIS — Z7982 Long term (current) use of aspirin: Secondary | ICD-10-CM | POA: Diagnosis not present

## 2020-12-15 DIAGNOSIS — M109 Gout, unspecified: Secondary | ICD-10-CM | POA: Diagnosis present

## 2020-12-15 DIAGNOSIS — M1990 Unspecified osteoarthritis, unspecified site: Secondary | ICD-10-CM | POA: Diagnosis present

## 2020-12-15 DIAGNOSIS — D62 Acute posthemorrhagic anemia: Secondary | ICD-10-CM | POA: Diagnosis not present

## 2020-12-15 DIAGNOSIS — Z794 Long term (current) use of insulin: Secondary | ICD-10-CM | POA: Diagnosis not present

## 2020-12-15 DIAGNOSIS — Z79899 Other long term (current) drug therapy: Secondary | ICD-10-CM

## 2020-12-15 DIAGNOSIS — Z20822 Contact with and (suspected) exposure to covid-19: Secondary | ICD-10-CM | POA: Diagnosis present

## 2020-12-15 DIAGNOSIS — I10 Essential (primary) hypertension: Secondary | ICD-10-CM | POA: Diagnosis present

## 2020-12-15 DIAGNOSIS — W19XXXD Unspecified fall, subsequent encounter: Secondary | ICD-10-CM | POA: Diagnosis present

## 2020-12-15 DIAGNOSIS — Z955 Presence of coronary angioplasty implant and graft: Secondary | ICD-10-CM | POA: Diagnosis not present

## 2020-12-15 DIAGNOSIS — J449 Chronic obstructive pulmonary disease, unspecified: Secondary | ICD-10-CM | POA: Diagnosis present

## 2020-12-15 DIAGNOSIS — I252 Old myocardial infarction: Secondary | ICD-10-CM

## 2020-12-15 DIAGNOSIS — Z7951 Long term (current) use of inhaled steroids: Secondary | ICD-10-CM | POA: Diagnosis not present

## 2020-12-15 DIAGNOSIS — Z9981 Dependence on supplemental oxygen: Secondary | ICD-10-CM | POA: Diagnosis not present

## 2020-12-15 DIAGNOSIS — E7800 Pure hypercholesterolemia, unspecified: Secondary | ICD-10-CM | POA: Diagnosis present

## 2020-12-15 DIAGNOSIS — T84093A Other mechanical complication of internal left knee prosthesis, initial encounter: Secondary | ICD-10-CM | POA: Diagnosis present

## 2020-12-15 DIAGNOSIS — K219 Gastro-esophageal reflux disease without esophagitis: Secondary | ICD-10-CM | POA: Diagnosis present

## 2020-12-15 DIAGNOSIS — Z85828 Personal history of other malignant neoplasm of skin: Secondary | ICD-10-CM

## 2020-12-15 DIAGNOSIS — S72402K Unspecified fracture of lower end of left femur, subsequent encounter for closed fracture with nonunion: Secondary | ICD-10-CM | POA: Diagnosis not present

## 2020-12-15 DIAGNOSIS — Z96652 Presence of left artificial knee joint: Secondary | ICD-10-CM

## 2020-12-15 DIAGNOSIS — M25562 Pain in left knee: Secondary | ICD-10-CM | POA: Diagnosis present

## 2020-12-15 DIAGNOSIS — E119 Type 2 diabetes mellitus without complications: Secondary | ICD-10-CM | POA: Diagnosis present

## 2020-12-15 DIAGNOSIS — C61 Malignant neoplasm of prostate: Secondary | ICD-10-CM | POA: Diagnosis present

## 2020-12-15 DIAGNOSIS — Z23 Encounter for immunization: Secondary | ICD-10-CM

## 2020-12-15 DIAGNOSIS — I2510 Atherosclerotic heart disease of native coronary artery without angina pectoris: Secondary | ICD-10-CM | POA: Diagnosis present

## 2020-12-15 DIAGNOSIS — Z7984 Long term (current) use of oral hypoglycemic drugs: Secondary | ICD-10-CM | POA: Diagnosis not present

## 2020-12-15 DIAGNOSIS — Z87891 Personal history of nicotine dependence: Secondary | ICD-10-CM | POA: Diagnosis not present

## 2020-12-15 HISTORY — PX: TOTAL KNEE REVISION: SHX996

## 2020-12-15 LAB — GLUCOSE, CAPILLARY
Glucose-Capillary: 115 mg/dL — ABNORMAL HIGH (ref 70–99)
Glucose-Capillary: 181 mg/dL — ABNORMAL HIGH (ref 70–99)

## 2020-12-15 LAB — PREPARE RBC (CROSSMATCH)

## 2020-12-15 SURGERY — TOTAL KNEE REVISION
Anesthesia: Spinal | Site: Knee | Laterality: Left

## 2020-12-15 MED ORDER — ONDANSETRON HCL 4 MG/2ML IJ SOLN: 4.0000 mg | Freq: Four times a day (QID) | INTRAMUSCULAR | Status: DC | PRN

## 2020-12-15 MED ORDER — DEXAMETHASONE SODIUM PHOSPHATE 10 MG/ML IJ SOLN
10.0000 mg | Freq: Once | INTRAMUSCULAR | Status: AC
  Administered 2020-12-16: 10 mg via INTRAVENOUS
  Filled 2020-12-15: qty 1

## 2020-12-15 MED ORDER — FLUTICASONE PROPIONATE 50 MCG/ACT NA SUSP
1.0000 | Freq: Every day | NASAL | Status: DC
  Administered 2020-12-17 – 2020-12-18 (×2): 1 via NASAL
  Filled 2020-12-15: qty 16

## 2020-12-15 MED ORDER — ISOSORBIDE MONONITRATE ER 30 MG PO TB24
45.0000 mg | ORAL_TABLET | Freq: Every day | ORAL | Status: DC
  Administered 2020-12-16 – 2020-12-18 (×3): 45 mg via ORAL
  Filled 2020-12-15 (×3): qty 2

## 2020-12-15 MED ORDER — PROPOFOL 500 MG/50ML IV EMUL
INTRAVENOUS | Status: AC
  Filled 2020-12-15: qty 200

## 2020-12-15 MED ORDER — FERROUS SULFATE 325 (65 FE) MG PO TABS
325.0000 mg | ORAL_TABLET | Freq: Three times a day (TID) | ORAL | Status: DC
  Administered 2020-12-16 – 2020-12-18 (×9): 325 mg via ORAL
  Filled 2020-12-15 (×9): qty 1

## 2020-12-15 MED ORDER — KETOROLAC TROMETHAMINE 30 MG/ML IJ SOLN
INTRAMUSCULAR | Status: AC
  Filled 2020-12-15: qty 1

## 2020-12-15 MED ORDER — CEFAZOLIN SODIUM-DEXTROSE 2-4 GM/100ML-% IV SOLN
2.0000 g | Freq: Four times a day (QID) | INTRAVENOUS | Status: AC
  Administered 2020-12-16 (×2): 2 g via INTRAVENOUS
  Filled 2020-12-15 (×2): qty 100

## 2020-12-15 MED ORDER — TORSEMIDE 20 MG PO TABS
20.0000 mg | ORAL_TABLET | Freq: Two times a day (BID) | ORAL | Status: DC
  Administered 2020-12-16 – 2020-12-18 (×6): 20 mg via ORAL
  Filled 2020-12-15 (×7): qty 1

## 2020-12-15 MED ORDER — ACARBOSE 25 MG PO TABS
100.0000 mg | ORAL_TABLET | Freq: Three times a day (TID) | ORAL | Status: DC
  Administered 2020-12-16 – 2020-12-18 (×9): 100 mg via ORAL
  Filled 2020-12-15 (×10): qty 4

## 2020-12-15 MED ORDER — PHENOL 1.4 % MT LIQD: 1.0000 | OROMUCOSAL | Status: DC | PRN

## 2020-12-15 MED ORDER — FENTANYL CITRATE PF 50 MCG/ML IJ SOSY
25.0000 ug | PREFILLED_SYRINGE | INTRAMUSCULAR | Status: DC | PRN
  Administered 2020-12-15 (×2): 50 ug via INTRAVENOUS

## 2020-12-15 MED ORDER — BUPIVACAINE-EPINEPHRINE (PF) 0.25% -1:200000 IJ SOLN
INTRAMUSCULAR | Status: AC
  Filled 2020-12-15: qty 30

## 2020-12-15 MED ORDER — PANTOPRAZOLE SODIUM 40 MG PO TBEC
40.0000 mg | DELAYED_RELEASE_TABLET | Freq: Every day | ORAL | Status: DC
  Administered 2020-12-16 – 2020-12-18 (×3): 40 mg via ORAL
  Filled 2020-12-15 (×3): qty 1

## 2020-12-15 MED ORDER — LEVOCETIRIZINE DIHYDROCHLORIDE 5 MG PO TABS: 5.0000 mg | ORAL_TABLET | Freq: Every evening | ORAL | Status: DC

## 2020-12-15 MED ORDER — FENTANYL CITRATE PF 50 MCG/ML IJ SOSY
PREFILLED_SYRINGE | INTRAMUSCULAR | Status: AC
  Filled 2020-12-15: qty 2

## 2020-12-15 MED ORDER — BISACODYL 10 MG RE SUPP: 10.0000 mg | Freq: Every day | RECTAL | Status: DC | PRN

## 2020-12-15 MED ORDER — ONDANSETRON HCL 4 MG PO TABS: 4.0000 mg | ORAL_TABLET | Freq: Four times a day (QID) | ORAL | Status: DC | PRN

## 2020-12-15 MED ORDER — METOCLOPRAMIDE HCL 5 MG/ML IJ SOLN: 5.0000 mg | Freq: Three times a day (TID) | INTRAMUSCULAR | Status: DC | PRN

## 2020-12-15 MED ORDER — ONDANSETRON HCL 4 MG/2ML IJ SOLN: 4.0000 mg | Freq: Once | INTRAMUSCULAR | Status: DC | PRN

## 2020-12-15 MED ORDER — ALBUMIN HUMAN 5 % IV SOLN: INTRAVENOUS | Status: DC | PRN

## 2020-12-15 MED ORDER — MOMETASONE FURO-FORMOTEROL FUM 200-5 MCG/ACT IN AERO
2.0000 | INHALATION_SPRAY | Freq: Two times a day (BID) | RESPIRATORY_TRACT | Status: DC
  Administered 2020-12-16 – 2020-12-18 (×6): 2 via RESPIRATORY_TRACT
  Filled 2020-12-15: qty 8.8

## 2020-12-15 MED ORDER — FENTANYL CITRATE (PF) 100 MCG/2ML IJ SOLN
INTRAMUSCULAR | Status: DC | PRN
  Administered 2020-12-15: 100 ug via INTRAVENOUS

## 2020-12-15 MED ORDER — DOCUSATE SODIUM 100 MG PO CAPS
100.0000 mg | ORAL_CAPSULE | Freq: Two times a day (BID) | ORAL | Status: DC
  Administered 2020-12-16 – 2020-12-18 (×4): 100 mg via ORAL
  Filled 2020-12-15 (×4): qty 1

## 2020-12-15 MED ORDER — ZOLPIDEM TARTRATE 5 MG PO TABS
5.0000 mg | ORAL_TABLET | Freq: Every evening | ORAL | Status: DC | PRN
  Administered 2020-12-16 – 2020-12-17 (×2): 5 mg via ORAL
  Filled 2020-12-15 (×2): qty 1

## 2020-12-15 MED ORDER — CARVEDILOL 25 MG PO TABS
25.0000 mg | ORAL_TABLET | Freq: Two times a day (BID) | ORAL | Status: DC
  Administered 2020-12-16 – 2020-12-18 (×6): 25 mg via ORAL
  Filled 2020-12-15 (×8): qty 1

## 2020-12-15 MED ORDER — ACETAMINOPHEN 325 MG PO TABS
325.0000 mg | ORAL_TABLET | Freq: Four times a day (QID) | ORAL | Status: DC | PRN
  Administered 2020-12-16: 650 mg via ORAL
  Filled 2020-12-15: qty 2

## 2020-12-15 MED ORDER — VANCOMYCIN HCL 1000 MG IV SOLR
INTRAVENOUS | Status: AC
  Filled 2020-12-15: qty 40

## 2020-12-15 MED ORDER — HYDROMORPHONE HCL 1 MG/ML IJ SOLN
0.5000 mg | INTRAMUSCULAR | Status: DC | PRN
  Administered 2020-12-15: 1 mg via INTRAVENOUS
  Filled 2020-12-15: qty 1

## 2020-12-15 MED ORDER — ATORVASTATIN CALCIUM 40 MG PO TABS
80.0000 mg | ORAL_TABLET | Freq: Every day | ORAL | Status: DC
  Administered 2020-12-16 – 2020-12-18 (×3): 80 mg via ORAL
  Filled 2020-12-15 (×3): qty 2

## 2020-12-15 MED ORDER — MIDAZOLAM HCL 2 MG/2ML IJ SOLN: 1.0000 mg | INTRAMUSCULAR | Status: DC

## 2020-12-15 MED ORDER — POVIDONE-IODINE 10 % EX SWAB: 2.0000 "application " | Freq: Once | CUTANEOUS | Status: DC

## 2020-12-15 MED ORDER — FENTANYL CITRATE PF 50 MCG/ML IJ SOSY
50.0000 ug | PREFILLED_SYRINGE | INTRAMUSCULAR | Status: DC
Start: 2020-12-15 — End: 2020-12-15
  Administered 2020-12-15: 50 ug via INTRAVENOUS

## 2020-12-15 MED ORDER — ACETAMINOPHEN 500 MG PO TABS
1000.0000 mg | ORAL_TABLET | Freq: Once | ORAL | Status: AC
  Administered 2020-12-15: 1000 mg via ORAL
  Filled 2020-12-15: qty 2

## 2020-12-15 MED ORDER — IRBESARTAN 75 MG PO TABS
75.0000 mg | ORAL_TABLET | Freq: Every day | ORAL | Status: DC
  Administered 2020-12-16 – 2020-12-18 (×3): 75 mg via ORAL
  Filled 2020-12-15 (×3): qty 1

## 2020-12-15 MED ORDER — SODIUM CHLORIDE 0.9 % IV SOLN: INTRAVENOUS | Status: DC

## 2020-12-15 MED ORDER — PHENYLEPHRINE HCL (PRESSORS) 10 MG/ML IV SOLN
INTRAVENOUS | Status: DC | PRN
  Administered 2020-12-15: 120 ug via INTRAVENOUS

## 2020-12-15 MED ORDER — CEFAZOLIN SODIUM-DEXTROSE 2-4 GM/100ML-% IV SOLN
2.0000 g | INTRAVENOUS | Status: AC
  Administered 2020-12-15: 2 g via INTRAVENOUS
  Filled 2020-12-15: qty 100

## 2020-12-15 MED ORDER — METOCLOPRAMIDE HCL 5 MG PO TABS: 5.0000 mg | ORAL_TABLET | Freq: Three times a day (TID) | ORAL | Status: DC | PRN

## 2020-12-15 MED ORDER — TRANEXAMIC ACID-NACL 1000-0.7 MG/100ML-% IV SOLN
1000.0000 mg | Freq: Once | INTRAVENOUS | Status: AC
  Administered 2020-12-16: 1000 mg via INTRAVENOUS
  Filled 2020-12-15: qty 100

## 2020-12-15 MED ORDER — DIPHENHYDRAMINE HCL 12.5 MG/5ML PO ELIX
12.5000 mg | ORAL_SOLUTION | ORAL | Status: DC | PRN
  Administered 2020-12-16 – 2020-12-18 (×3): 25 mg via ORAL
  Filled 2020-12-15 (×3): qty 10

## 2020-12-15 MED ORDER — RIVAROXABAN 10 MG PO TABS
10.0000 mg | ORAL_TABLET | Freq: Every day | ORAL | Status: DC
  Administered 2020-12-16 – 2020-12-18 (×3): 10 mg via ORAL
  Filled 2020-12-15 (×3): qty 1

## 2020-12-15 MED ORDER — PROPOFOL 1000 MG/100ML IV EMUL
INTRAVENOUS | Status: AC
  Filled 2020-12-15: qty 300

## 2020-12-15 MED ORDER — GLIPIZIDE ER 5 MG PO TB24
5.0000 mg | ORAL_TABLET | Freq: Every day | ORAL | Status: DC
  Administered 2020-12-17 – 2020-12-18 (×2): 5 mg via ORAL
  Filled 2020-12-15: qty 1

## 2020-12-15 MED ORDER — OXYCODONE HCL 5 MG PO TABS
5.0000 mg | ORAL_TABLET | ORAL | Status: DC | PRN
  Administered 2020-12-17 – 2020-12-18 (×4): 10 mg via ORAL
  Filled 2020-12-15 (×4): qty 2

## 2020-12-15 MED ORDER — SODIUM CHLORIDE 0.9 % IR SOLN
Status: DC | PRN
  Administered 2020-12-15: 3000 mL

## 2020-12-15 MED ORDER — SODIUM CHLORIDE 0.9% IV SOLUTION: Freq: Once | INTRAVENOUS | Status: DC

## 2020-12-15 MED ORDER — LORATADINE 10 MG PO TABS
10.0000 mg | ORAL_TABLET | Freq: Every day | ORAL | Status: DC
  Administered 2020-12-16 – 2020-12-18 (×3): 10 mg via ORAL
  Filled 2020-12-15 (×3): qty 1

## 2020-12-15 MED ORDER — ARTIFICIAL TEARS OPHTHALMIC OINT
TOPICAL_OINTMENT | OPHTHALMIC | Status: AC
  Filled 2020-12-15: qty 3.5

## 2020-12-15 MED ORDER — STERILE WATER FOR IRRIGATION IR SOLN
Status: DC | PRN
  Administered 2020-12-15: 2000 mL

## 2020-12-15 MED ORDER — 0.9 % SODIUM CHLORIDE (POUR BTL) OPTIME
TOPICAL | Status: DC | PRN
  Administered 2020-12-15: 1000 mL

## 2020-12-15 MED ORDER — SODIUM CHLORIDE 0.9 % IV SOLN
INTRAVENOUS | Status: DC | PRN
Start: 2020-12-15 — End: 2020-12-15

## 2020-12-15 MED ORDER — LACTATED RINGERS IV SOLN: INTRAVENOUS | Status: DC

## 2020-12-15 MED ORDER — GABAPENTIN 300 MG PO CAPS
600.0000 mg | ORAL_CAPSULE | Freq: Every day | ORAL | Status: DC
  Administered 2020-12-15 – 2020-12-17 (×3): 600 mg via ORAL
  Filled 2020-12-15 (×3): qty 2

## 2020-12-15 MED ORDER — AMLODIPINE BESYLATE 10 MG PO TABS
10.0000 mg | ORAL_TABLET | Freq: Every day | ORAL | Status: DC
  Administered 2020-12-16 – 2020-12-17 (×2): 10 mg via ORAL
  Filled 2020-12-15 (×3): qty 1

## 2020-12-15 MED ORDER — NITROGLYCERIN 0.4 MG SL SUBL: 0.4000 mg | SUBLINGUAL_TABLET | SUBLINGUAL | Status: DC | PRN

## 2020-12-15 MED ORDER — OXYCODONE HCL 5 MG PO TABS
10.0000 mg | ORAL_TABLET | ORAL | Status: DC | PRN
  Administered 2020-12-16 – 2020-12-17 (×7): 15 mg via ORAL
  Filled 2020-12-15 (×7): qty 3

## 2020-12-15 MED ORDER — METHOCARBAMOL 500 MG PO TABS
500.0000 mg | ORAL_TABLET | Freq: Four times a day (QID) | ORAL | Status: DC | PRN
  Administered 2020-12-16 – 2020-12-18 (×6): 500 mg via ORAL
  Filled 2020-12-15 (×6): qty 1

## 2020-12-15 MED ORDER — TOBRAMYCIN SULFATE 1.2 G IJ SOLR
INTRAMUSCULAR | Status: AC
  Filled 2020-12-15: qty 2.4

## 2020-12-15 MED ORDER — BUPIVACAINE IN DEXTROSE 0.75-8.25 % IT SOLN
INTRATHECAL | Status: DC | PRN
  Administered 2020-12-15: 1.8 mL via INTRATHECAL

## 2020-12-15 MED ORDER — POLYETHYLENE GLYCOL 3350 17 G PO PACK: 17.0000 g | PACK | Freq: Every day | ORAL | Status: DC | PRN

## 2020-12-15 MED ORDER — PHENYLEPHRINE 40 MCG/ML (10ML) SYRINGE FOR IV PUSH (FOR BLOOD PRESSURE SUPPORT)
PREFILLED_SYRINGE | INTRAVENOUS | Status: AC
  Filled 2020-12-15: qty 30

## 2020-12-15 MED ORDER — CLONIDINE HCL (ANALGESIA) 100 MCG/ML EP SOLN
EPIDURAL | Status: DC | PRN
  Administered 2020-12-15: 50 ug

## 2020-12-15 MED ORDER — DEXAMETHASONE SODIUM PHOSPHATE 10 MG/ML IJ SOLN
8.0000 mg | Freq: Once | INTRAMUSCULAR | Status: AC
  Administered 2020-12-15: 5 mg via INTRAVENOUS

## 2020-12-15 MED ORDER — ONDANSETRON HCL 4 MG/2ML IJ SOLN
INTRAMUSCULAR | Status: AC
  Filled 2020-12-15: qty 10

## 2020-12-15 MED ORDER — METFORMIN HCL ER 500 MG PO TB24
500.0000 mg | ORAL_TABLET | Freq: Two times a day (BID) | ORAL | Status: DC
  Administered 2020-12-16 – 2020-12-18 (×6): 500 mg via ORAL
  Filled 2020-12-15 (×6): qty 1

## 2020-12-15 MED ORDER — DEXAMETHASONE SODIUM PHOSPHATE 10 MG/ML IJ SOLN
INTRAMUSCULAR | Status: AC
  Filled 2020-12-15: qty 2

## 2020-12-15 MED ORDER — EPHEDRINE 5 MG/ML INJ
INTRAVENOUS | Status: AC
  Filled 2020-12-15: qty 5

## 2020-12-15 MED ORDER — FENTANYL CITRATE PF 50 MCG/ML IJ SOSY
PREFILLED_SYRINGE | INTRAMUSCULAR | Status: AC
  Filled 2020-12-15: qty 1

## 2020-12-15 MED ORDER — ORAL CARE MOUTH RINSE: 15.0000 mL | Freq: Once | OROMUCOSAL | Status: AC

## 2020-12-15 MED ORDER — METHOCARBAMOL 500 MG IVPB - SIMPLE MED
500.0000 mg | Freq: Four times a day (QID) | INTRAVENOUS | Status: DC | PRN
  Filled 2020-12-15: qty 50

## 2020-12-15 MED ORDER — ONDANSETRON HCL 4 MG/2ML IJ SOLN
INTRAMUSCULAR | Status: DC | PRN
  Administered 2020-12-15: 4 mg via INTRAVENOUS

## 2020-12-15 MED ORDER — PHENYLEPHRINE HCL-NACL 20-0.9 MG/250ML-% IV SOLN
INTRAVENOUS | Status: DC | PRN
  Administered 2020-12-15: 30 ug/min via INTRAVENOUS

## 2020-12-15 MED ORDER — CHLORHEXIDINE GLUCONATE 0.12 % MT SOLN
15.0000 mL | Freq: Once | OROMUCOSAL | Status: AC
  Administered 2020-12-15: 15 mL via OROMUCOSAL

## 2020-12-15 MED ORDER — ALLOPURINOL 300 MG PO TABS
300.0000 mg | ORAL_TABLET | Freq: Every day | ORAL | Status: DC
  Administered 2020-12-16 – 2020-12-18 (×3): 300 mg via ORAL
  Filled 2020-12-15 (×3): qty 1

## 2020-12-15 MED ORDER — MENTHOL 3 MG MT LOZG: 1.0000 | LOZENGE | OROMUCOSAL | Status: DC | PRN

## 2020-12-15 MED ORDER — FENTANYL CITRATE (PF) 100 MCG/2ML IJ SOLN
INTRAMUSCULAR | Status: AC
  Filled 2020-12-15: qty 2

## 2020-12-15 MED ORDER — MIDAZOLAM HCL 2 MG/2ML IJ SOLN
INTRAMUSCULAR | Status: AC
  Filled 2020-12-15: qty 2

## 2020-12-15 MED ORDER — ALBUMIN HUMAN 5 % IV SOLN
INTRAVENOUS | Status: AC
  Filled 2020-12-15: qty 500

## 2020-12-15 MED ORDER — TRANEXAMIC ACID-NACL 1000-0.7 MG/100ML-% IV SOLN
1000.0000 mg | INTRAVENOUS | Status: AC
  Administered 2020-12-15: 1000 mg via INTRAVENOUS
  Filled 2020-12-15: qty 100

## 2020-12-15 MED ORDER — ROPIVACAINE HCL 5 MG/ML IJ SOLN
INTRAMUSCULAR | Status: DC | PRN
  Administered 2020-12-15: 30 mL via PERINEURAL

## 2020-12-15 MED ORDER — MONTELUKAST SODIUM 10 MG PO TABS
10.0000 mg | ORAL_TABLET | Freq: Every day | ORAL | Status: DC
  Administered 2020-12-15 – 2020-12-17 (×3): 10 mg via ORAL
  Filled 2020-12-15 (×3): qty 1

## 2020-12-15 MED ORDER — METHOCARBAMOL 500 MG IVPB - SIMPLE MED
INTRAVENOUS | Status: AC
  Administered 2020-12-15: 500 mg via INTRAVENOUS
  Filled 2020-12-15: qty 50

## 2020-12-15 MED ORDER — PROPOFOL 500 MG/50ML IV EMUL
INTRAVENOUS | Status: DC | PRN
  Administered 2020-12-15: 75 ug/kg/min via INTRAVENOUS

## 2020-12-15 MED ORDER — ALBUMIN HUMAN 5 % IV SOLN
INTRAVENOUS | Status: AC
  Filled 2020-12-15: qty 250

## 2020-12-15 SURGICAL SUPPLY — 79 items
ADH SKN CLS APL DERMABOND .7 (GAUZE/BANDAGES/DRESSINGS)
ATUNE TIB SLV M/L 53 (Orthopedic Implant) ×3 IMPLANT
AUG FEM SZ6 4 REV POST STRL LF (Miscellaneous) ×2 IMPLANT
AUGMENT POST FEM SZ6 4 KNEE (Miscellaneous) ×4 IMPLANT
BAG COUNTER SPONGE SURGICOUNT (BAG) ×2 IMPLANT
BAG DECANTER FOR FLEXI CONT (MISCELLANEOUS) IMPLANT
BAG SPEC THK2 15X12 ZIP CLS (MISCELLANEOUS)
BAG SPNG CNTER NS LX DISP (BAG) ×2
BAG SURGICOUNT SPONGE COUNTING (BAG) ×2
BAG ZIPLOCK 12X15 (MISCELLANEOUS) IMPLANT
BASE TIB KNEE REV RP ATUNE SZ6 (Knees) ×2 IMPLANT
BLADE SAW SGTL 11.0X1.19X90.0M (BLADE) IMPLANT
BLADE SAW SGTL 13.0X1.19X90.0M (BLADE) ×3 IMPLANT
BLADE SAW SGTL 81X20 HD (BLADE) ×3 IMPLANT
BLADE SURG SZ10 CARB STEEL (BLADE) ×6 IMPLANT
BNDG CMPR MED 10X6 ELC LF (GAUZE/BANDAGES/DRESSINGS) ×1
BNDG ELASTIC 6X10 VLCR STRL LF (GAUZE/BANDAGES/DRESSINGS) ×2 IMPLANT
BNDG ELASTIC 6X5.8 VLCR STR LF (GAUZE/BANDAGES/DRESSINGS) ×3 IMPLANT
BRUSH FEMORAL CANAL (MISCELLANEOUS) IMPLANT
BSPLAT TIB 6 CMNT REV ROT PLAT (Knees) ×1 IMPLANT
COMP FEM ATTUNE CRS SZ6 LT (Femur) ×3 IMPLANT
COMPONENT FEM ATN CRS SZ6 LT (Femur) IMPLANT
COVER SURGICAL LIGHT HANDLE (MISCELLANEOUS) ×3 IMPLANT
CUFF TOURN SGL QUICK 34 (TOURNIQUET CUFF) ×3
CUFF TRNQT CYL 34X4.125X (TOURNIQUET CUFF) ×1 IMPLANT
DECANTER SPIKE VIAL GLASS SM (MISCELLANEOUS) IMPLANT
DERMABOND ADVANCED (GAUZE/BANDAGES/DRESSINGS)
DERMABOND ADVANCED .7 DNX12 (GAUZE/BANDAGES/DRESSINGS) ×1 IMPLANT
DRAPE U-SHAPE 47X51 STRL (DRAPES) ×3 IMPLANT
DRESSING AQUACEL AG SP 3.5X10 (GAUZE/BANDAGES/DRESSINGS) IMPLANT
DRSG AQUACEL AG ADV 3.5X10 (GAUZE/BANDAGES/DRESSINGS) ×1 IMPLANT
DRSG AQUACEL AG ADV 3.5X14 (GAUZE/BANDAGES/DRESSINGS) ×2 IMPLANT
DRSG AQUACEL AG SP 3.5X10 (GAUZE/BANDAGES/DRESSINGS)
DRSG MEPILEX BORDER 4X12 (GAUZE/BANDAGES/DRESSINGS) ×2 IMPLANT
DRSG MEPILEX BORDER 4X8 (GAUZE/BANDAGES/DRESSINGS) ×2 IMPLANT
DRSG PAD ABDOMINAL 8X10 ST (GAUZE/BANDAGES/DRESSINGS) ×2 IMPLANT
DURAPREP 26ML APPLICATOR (WOUND CARE) ×6 IMPLANT
ELECT REM PT RETURN 15FT ADLT (MISCELLANEOUS) ×3 IMPLANT
GAUZE SPONGE 2X2 8PLY STRL LF (GAUZE/BANDAGES/DRESSINGS) IMPLANT
GAUZE XEROFORM 5X9 LF (GAUZE/BANDAGES/DRESSINGS) ×2 IMPLANT
GLOVE SURG UNDER POLY LF SZ7.5 (GLOVE) ×6 IMPLANT
GOWN STRL REUS W/TWL LRG LVL3 (GOWN DISPOSABLE) ×3 IMPLANT
HANDPIECE INTERPULSE COAX TIP (DISPOSABLE) ×3
HOLDER FOLEY CATH W/STRAP (MISCELLANEOUS) IMPLANT
INSERT TIB CRS ATTUNE SZ6 10 (Insert) ×2 IMPLANT
IRRIGATION SURGIPHOR STRL (IV SOLUTION) IMPLANT
KIT TURNOVER KIT A (KITS) ×3 IMPLANT
MANIFOLD NEPTUNE II (INSTRUMENTS) ×3 IMPLANT
NDL SAFETY ECLIPSE 18X1.5 (NEEDLE) IMPLANT
NEEDLE HYPO 18GX1.5 SHARP (NEEDLE)
NS IRRIG 1000ML POUR BTL (IV SOLUTION) ×3 IMPLANT
PACK TOTAL KNEE CUSTOM (KITS) ×3 IMPLANT
PADDING CAST ABS 6INX4YD NS (CAST SUPPLIES) ×2
PADDING CAST ABS COTTON 6X4 NS (CAST SUPPLIES) IMPLANT
PENCIL SMOKE EVACUATOR (MISCELLANEOUS) ×3 IMPLANT
PIN FIX SIGMA LCS THRD HI (PIN) ×2 IMPLANT
PROTECTOR NERVE ULNAR (MISCELLANEOUS) ×3 IMPLANT
SET HNDPC FAN SPRY TIP SCT (DISPOSABLE) ×1 IMPLANT
SET PAD KNEE POSITIONER (MISCELLANEOUS) ×3 IMPLANT
SLEEVE ATTUNE TIB M/L 53 (Orthopedic Implant) IMPLANT
SPONGE GAUZE 2X2 STER 10/PKG (GAUZE/BANDAGES/DRESSINGS)
SPONGE T-LAP 18X18 ~~LOC~~+RFID (SPONGE) ×6 IMPLANT
STAPLER VISISTAT 35W (STAPLE) ×4 IMPLANT
STEM STR ATTUNE PF 20X60 (Knees) ×2 IMPLANT
STEM STR ATTUNE PF 22X110 (Knees) ×2 IMPLANT
SUT MNCRL AB 3-0 PS2 18 (SUTURE) ×3 IMPLANT
SUT STRATAFIX PDS+ 0 24IN (SUTURE) ×9 IMPLANT
SUT VIC AB 1 CT1 27 (SUTURE) ×3
SUT VIC AB 1 CT1 27XBRD ANTBC (SUTURE) IMPLANT
SUT VIC AB 1 CT1 36 (SUTURE) ×7 IMPLANT
SUT VIC AB 2-0 CT1 27 (SUTURE) ×18
SUT VIC AB 2-0 CT1 TAPERPNT 27 (SUTURE) ×3 IMPLANT
SYR 50ML LL SCALE MARK (SYRINGE) IMPLANT
TOWER CARTRIDGE SMART MIX (DISPOSABLE) ×3 IMPLANT
TRAY FOLEY MTR SLVR 16FR STAT (SET/KITS/TRAYS/PACK) ×3 IMPLANT
TUBE KAMVAC SUCTION (TUBING) IMPLANT
TUBE SUCTION HIGH CAP CLEAR NV (SUCTIONS) ×3 IMPLANT
WATER STERILE IRR 1000ML POUR (IV SOLUTION) ×3 IMPLANT
WRAP KNEE MAXI GEL POST OP (GAUZE/BANDAGES/DRESSINGS) ×3 IMPLANT

## 2020-12-15 NOTE — Transfer of Care (Signed)
Immediate Anesthesia Transfer of Care Note  Patient: [name]  Procedure(s) Performed: LEFT TOTAL KNEE REVISION WITH REMOVAL OF HARDWARE (Left: Knee)  Patient Location: PACU  Anesthesia Type:Spinal  Level of Consciousness: awake  Airway & Oxygen Therapy: Patient Spontanous Breathing and Patient connected to face mask oxygen  Post-op Assessment: Report given to RN and Post -op Vital signs reviewed and stable  Post vital signs: Reviewed and stable  Last Vitals:  Vitals Value Taken Time  BP 112/64 12/15/20 2000  Temp    Pulse 57 12/15/20 2000  Resp 13 12/15/20 2000  SpO2 97 % 12/15/20 2000  Vitals shown include unvalidated device data.  Last Pain:  Vitals:   12/15/20 1535  TempSrc:   PainSc: 3          Complications: No notable events documented.

## 2020-12-15 NOTE — Anesthesia Procedure Notes (Signed)
Spinal  Patient location during procedure: OR Start time: 12/15/2020 3:52 PM End time: 12/15/2020 3:55 PM Reason for block: surgical anesthesia Staffing Performed: anesthesiologist  Anesthesiologist: Catalina Gravel, MD Preanesthetic Checklist Completed: patient identified, IV checked, risks and benefits discussed, surgical consent, monitors and equipment checked, pre-op evaluation and timeout performed Spinal Block Patient position: sitting Prep: DuraPrep and site prepped and draped Patient monitoring: continuous pulse ox and blood pressure Approach: midline Location: L3-4 Injection technique: single-shot Needle Needle type: Pencan  Needle gauge: 24 G Assessment Events: CSF return Additional Notes Functioning IV was confirmed and monitors were applied. Sterile prep and drape, including hand hygiene, mask and sterile gloves were used. The patient was positioned and the spine was prepped. The skin was anesthetized with lidocaine.  Free flow of clear CSF was obtained prior to injecting local anesthetic into the CSF.  The spinal needle aspirated freely following injection.  The needle was carefully withdrawn.  The patient tolerated the procedure well. Consent was obtained prior to procedure with all questions answered and concerns addressed. Risks including but not limited to bleeding, infection, nerve damage, paralysis, failed block, inadequate analgesia, allergic reaction, high spinal, itching and headache were discussed and the patient wished to proceed.   Hoy Morn, MD

## 2020-12-15 NOTE — Anesthesia Procedure Notes (Signed)
Anesthesia Regional Block: Adductor canal block   Pre-Anesthetic Checklist: , timeout performed,  Correct Patient, Correct Site, Correct Laterality,  Correct Procedure, Correct Position, site marked,  Risks and benefits discussed,  Surgical consent,  Pre-op evaluation,  At surgeon's request and post-op pain management  Laterality: Left  Prep: chloraprep       Needles:  Injection technique: Single-shot  Needle Type: Echogenic Needle     Needle Length: 9cm  Needle Gauge: 21     Additional Needles:   Procedures:,,,, ultrasound used (permanent image in chart),,    Narrative:  Start time: 12/15/2020 3:13 PM End time: 12/15/2020 3:20 PM Injection made incrementally with aspirations every 5 mL.  Performed by: Personally  Anesthesiologist: Catalina Gravel, MD  Additional Notes: No pain on injection. No increased resistance to injection. Injection made in 5cc increments.  Good needle visualization.  Patient tolerated procedure well.

## 2020-12-15 NOTE — Progress Notes (Signed)
AssistedDr. Turk with left, ultrasound guided, adductor canal block. Side rails up, monitors on throughout procedure. See vital signs in flow sheet. Tolerated Procedure well.  

## 2020-12-15 NOTE — Discharge Instructions (Addendum)
INSTRUCTIONS AFTER JOINT REPLACEMENT   Remove items at home which could result in a fall. This includes throw rugs or furniture in walking pathways ICE to the affected joint every three hours while awake for 30 minutes at a time, for at least the first 3-5 days, and then as needed for pain and swelling.  Continue to use ice for pain and swelling. You may notice swelling that will progress down to the foot and ankle.  This is normal after surgery.  Elevate your leg when you are not up walking on it.   Continue to use the breathing machine you got in the hospital (incentive spirometer) which will help keep your temperature down.  It is common for your temperature to cycle up and down following surgery, especially at night when you are not up moving around and exerting yourself.  The breathing machine keeps your lungs expanded and your temperature down.   DIET:  As you were doing prior to hospitalization, we recommend a well-balanced diet.  DRESSING / WOUND CARE / SHOWERING  Keep the surgical dressing until follow up.  The dressing is water proof, so you can shower without any extra covering.  IF THE DRESSING FALLS OFF or the wound gets wet inside, change the dressing with sterile gauze.  Please use good hand washing techniques before changing the dressing.  Do not use any lotions or creams on the incision until instructed by your surgeon.    ACTIVITY  Increase activity slowly as tolerated, but follow the weight bearing instructions below.   No driving for 6 weeks or until further direction given by your physician.  You cannot drive while taking narcotics.  No lifting or carrying greater than 10 lbs. until further directed by your surgeon. Avoid periods of inactivity such as sitting longer than an hour when not asleep. This helps prevent blood clots.  You may return to work once you are authorized by your doctor.     WEIGHT BEARING   Partial weight bearing with assist device as directed.       EXERCISES  Results after joint replacement surgery are often greatly improved when you follow the exercise, range of motion and muscle strengthening exercises prescribed by your doctor. Safety measures are also important to protect the joint from further injury. Any time any of these exercises cause you to have increased pain or swelling, decrease what you are doing until you are comfortable again and then slowly increase them. If you have problems or questions, call your caregiver or physical therapist for advice.   Rehabilitation is important following a joint replacement. After just a few days of immobilization, the muscles of the leg can become weakened and shrink (atrophy).  These exercises are designed to build up the tone and strength of the thigh and leg muscles and to improve motion. Often times heat used for twenty to thirty minutes before working out will loosen up your tissues and help with improving the range of motion but do not use heat for the first two weeks following surgery (sometimes heat can increase post-operative swelling).   These exercises can be done on a training (exercise) mat, on the floor, on a table or on a bed. Use whatever works the best and is most comfortable for you.    Use music or television while you are exercising so that the exercises are a pleasant break in your day. This will make your life better with the exercises acting as a break in your routine   that you can look forward to.   Perform all exercises about fifteen times, three times per day or as directed.  You should exercise both the operative leg and the other leg as well.  Exercises include:   Quad Sets - Tighten up the muscle on the front of the thigh (Quad) and hold for 5-10 seconds.   Straight Leg Raises - With your knee straight (if you were given a brace, keep it on), lift the leg to 60 degrees, hold for 3 seconds, and slowly lower the leg.  Perform this exercise against resistance later as your  leg gets stronger.  Leg Slides: Lying on your back, slowly slide your foot toward your buttocks, bending your knee up off the floor (only go as far as is comfortable). Then slowly slide your foot back down until your leg is flat on the floor again.  Angel Wings: Lying on your back spread your legs to the side as far apart as you can without causing discomfort.  Hamstring Strength:  Lying on your back, push your heel against the floor with your leg straight by tightening up the muscles of your buttocks.  Repeat, but this time bend your knee to a comfortable angle, and push your heel against the floor.  You may put a pillow under the heel to make it more comfortable if necessary.   A rehabilitation program following joint replacement surgery can speed recovery and prevent re-injury in the future due to weakened muscles. Contact your doctor or a physical therapist for more information on knee rehabilitation.    CONSTIPATION  Constipation is defined medically as fewer than three stools per week and severe constipation as less than one stool per week.  Even if you have a regular bowel pattern at home, your normal regimen is likely to be disrupted due to multiple reasons following surgery.  Combination of anesthesia, postoperative narcotics, change in appetite and fluid intake all can affect your bowels.   YOU MUST use at least one of the following options; they are listed in order of increasing strength to get the job done.  They are all available over the counter, and you may need to use some, POSSIBLY even all of these options:    Drink plenty of fluids (prune juice may be helpful) and high fiber foods Colace 100 mg by mouth twice a day  Senokot for constipation as directed and as needed Dulcolax (bisacodyl), take with full glass of water  Miralax (polyethylene glycol) once or twice a day as needed.  If you have tried all these things and are unable to have a bowel movement in the first 3-4 days  after surgery call either your surgeon or your primary doctor.    If you experience loose stools or diarrhea, hold the medications until you stool forms back up.  If your symptoms do not get better within 1 week or if they get worse, check with your doctor.  If you experience "the worst abdominal pain ever" or develop nausea or vomiting, please contact the office immediately for further recommendations for treatment.   ITCHING:  If you experience itching with your medications, try taking only a single pain pill, or even half a pain pill at a time.  You can also use Benadryl over the counter for itching or also to help with sleep.   TED HOSE STOCKINGS:  Use stockings on both legs until for at least 2 weeks or as directed by physician office. They may be removed   at night for sleeping.  MEDICATIONS:  See your medication summary on the "After Visit Summary" that nursing will review with you.  You may have some home medications which will be placed on hold until you complete the course of blood thinner medication.  It is important for you to complete the blood thinner medication as prescribed.  PRECAUTIONS:  If you experience chest pain or shortness of breath - call 911 immediately for transfer to the hospital emergency department.   If you develop a fever greater that 101 F, purulent drainage from wound, increased redness or drainage from wound, foul odor from the wound/dressing, or calf pain - CONTACT YOUR SURGEON.                                                   FOLLOW-UP APPOINTMENTS:  If you do not already have a post-op appointment, please call the office for an appointment to be seen by your surgeon.  Guidelines for how soon to be seen are listed in your "After Visit Summary", but are typically between 1-4 weeks after surgery.  OTHER INSTRUCTIONS:   Knee Replacement:  Do not place pillow under knee, focus on keeping the knee straight while resting. CPM instructions: 0-90 degrees, 2 hours in the  morning, 2 hours in the afternoon, and 2 hours in the evening. Place foam block, curve side up under heel at all times except when in CPM or when walking.  DO NOT modify, tear, cut, or change the foam block in any way.  POST-OPERATIVE OPIOID TAPER INSTRUCTIONS: It is important to wean off of your opioid medication as soon as possible. If you do not need pain medication after your surgery it is ok to stop day one. Opioids include: Codeine, Hydrocodone(Norco, Vicodin), Oxycodone(Percocet, oxycontin) and hydromorphone amongst others.  Long term and even short term use of opiods can cause: Increased pain response Dependence Constipation Depression Respiratory depression And more.  Withdrawal symptoms can include Flu like symptoms Nausea, vomiting And more Techniques to manage these symptoms Hydrate well Eat regular healthy meals Stay active Use relaxation techniques(deep breathing, meditating, yoga) Do Not substitute Alcohol to help with tapering If you have been on opioids for less than two weeks and do not have pain than it is ok to stop all together.  Plan to wean off of opioids This plan should start within one week post op of your joint replacement. Maintain the same interval or time between taking each dose and first decrease the dose.  Cut the total daily intake of opioids by one tablet each day Next start to increase the time between doses. The last dose that should be eliminated is the evening dose.   MAKE SURE YOU:  Understand these instructions.  Get help right away if you are not doing well or get worse.    Thank you for letting us be a part of your medical care team.  It is a privilege we respect greatly.  We hope these instructions will help you stay on track for a fast and full recovery!     Information on my medicine - XARELTO (Rivaroxaban)  Why was Xarelto prescribed for you? Xarelto was prescribed for you to reduce the risk of blood clots forming after  orthopedic surgery. The medical term for these abnormal blood clots is venous thromboembolism (VTE).  What do  you need to know about xarelto ? Take your Xarelto ONCE DAILY at the same time every day. You may take it either with or without food.  If you have difficulty swallowing the tablet whole, you may crush it and mix in applesauce just prior to taking your dose.  Take Xarelto exactly as prescribed by your doctor and DO NOT stop taking Xarelto without talking to the doctor who prescribed the medication.  Stopping without other VTE prevention medication to take the place of Xarelto may increase your risk of developing a clot.  After discharge, you should have regular check-up appointments with your healthcare provider that is prescribing your Xarelto.    What do you do if you miss a dose? If you miss a dose, take it as soon as you remember on the same day then continue your regularly scheduled once daily regimen the next day. Do not take two doses of Xarelto on the same day.   Important Safety Information A possible side effect of Xarelto is bleeding. You should call your healthcare provider right away if you experience any of the following: Bleeding from an injury or your nose that does not stop. Unusual colored urine (red or dark brown) or unusual colored stools (red or black). Unusual bruising for unknown reasons. A serious fall or if you hit your head (even if there is no bleeding).  Some medicines may interact with Xarelto and might increase your risk of bleeding while on Xarelto. To help avoid this, consult your healthcare provider or pharmacist prior to using any new prescription or non-prescription medications, including herbals, vitamins, non-steroidal anti-inflammatory drugs (NSAIDs) and supplements.  This website has more information on Xarelto: [email or web address]

## 2020-12-16 ENCOUNTER — Other Ambulatory Visit: Payer: Self-pay

## 2020-12-16 LAB — BASIC METABOLIC PANEL
Anion gap: 9 (ref 5–15)
BUN: 17 mg/dL (ref 8–23)
CO2: 21 mmol/L — ABNORMAL LOW (ref 22–32)
Calcium: 7.9 mg/dL — ABNORMAL LOW (ref 8.9–10.3)
Chloride: 108 mmol/L (ref 98–111)
Creatinine, Ser: 1.09 mg/dL (ref 0.61–1.24)
GFR, Estimated: >60 mL/min (ref >60)
Glucose, Bld: 364 mg/dL — ABNORMAL HIGH (ref 70–99)
Potassium: 4.5 mmol/L (ref 3.5–5.1)
Sodium: 138 mmol/L (ref 135–145)

## 2020-12-16 LAB — CBC
HCT: 25.1 % — ABNORMAL LOW (ref 39.0–52.0)
Hemoglobin: 8.7 g/dL — ABNORMAL LOW (ref 13.0–17.0)
MCH: 32.6 pg (ref 26.0–34.0)
MCHC: 34.7 g/dL (ref 30.0–36.0)
MCV: 94 fL (ref 80.0–100.0)
Platelets: 128 10*3/uL — ABNORMAL LOW (ref 150–400)
RBC: 2.67 MIL/uL — ABNORMAL LOW (ref 4.22–5.81)
RDW: 14.4 % (ref 11.5–15.5)
WBC: 19.7 10*3/uL — ABNORMAL HIGH (ref 4.0–10.5)
nRBC: 0 % (ref 0.0–0.2)

## 2020-12-16 NOTE — TOC Initial Note (Signed)
Transition of Care [facility]) - Initial/Assessment Note   Patient Details  Name: [name] MRN: [id number] Date of Birth: 1945-12-03  Transition of [facility]) CM/SW Contact:    Sherie Don, LCSW Phone Number: 12/16/2020, 2:28 PM  Clinical Narrative: PT evaluation recommended SNF. CSW spoke with patient regarding recommendations. Patient is agreeable to SNF and [facility] is his first choice. Patient is vaccinated, but not boosted for COVID; patient is willing to be boosted if needed for placement.  FL2 done; PASRR verified. Initial referral faxed out. TOC awaiting bed offers.  Expected Discharge Plan: Skilled Nursing Facility Barriers to Discharge: Continued Medical Work up, SNF Pending bed offer, Insurance Authorization  Patient Goals and CMS Choice Patient states their goals for this hospitalization and ongoing recovery are:: Go to rehab CMS Medicare.gov Compare Post Acute Care list provided to:: Patient Choice offered to / list presented to : Patient  Expected Discharge Plan and Services Expected Discharge Plan: [place] In-house Referral: Clinical Social Work Post Acute Care Choice: [place] Living arrangements for the past 2 months: [place]             DME Arranged: N/A DME Agency: NA  Prior Living Arrangements/Services Living arrangements for the past 2 months: Single Family Home Lives with:: Spouse Patient language and need for interpreter reviewed:: Yes Do you feel safe going back to the place where you live?: Yes      Need for Family Participation in Patient Care: No (Comment) Care giver support system in place?: Yes (comment) Criminal Activity/Legal Involvement Pertinent to Current Situation/Hospitalization: No - Comment as needed  Activities of Daily Living Home Assistive Devices/Equipment: Cane (specify quad or straight) ADL Screening (condition at time of admission) Patient's cognitive ability adequate to safely  complete daily activities?: Yes Is the patient deaf or have difficulty hearing?: No Does the patient have difficulty seeing, even when wearing glasses/contacts?: No Does the patient have difficulty concentrating, remembering, or making decisions?: No Patient able to express need for assistance with ADLs?: Yes Does the patient have difficulty dressing or bathing?: No Independently performs ADLs?: Yes (appropriate for developmental age) Does the patient have difficulty walking or climbing stairs?: Yes Weakness of Legs: Right Weakness of Arms/Hands: None  Permission Sought/Granted Permission sought to share information with : Facility Art therapist granted to share information with : Yes, Verbal Permission Granted Permission granted to share info w AGENCY: SNFs  Emotional Assessment Appearance:: Appears stated age Attitude/Demeanor/Rapport: Engaged Affect (typically observed): Accepting Orientation: : Oriented to Self, Oriented to Place, Oriented to  Time, Oriented to Situation Alcohol / Substance Use: Not Applicable Psych Involvement: No (comment)  Admission diagnosis:  S/P revision of total knee, 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 [place] [address] Phone:  [phone] Fax: [phone]  Readmission Risk Interventions  No flowsheet data found.

## 2020-12-16 NOTE — NC FL2 (Signed)
[place] LEVEL OF CARE SCREENING TOOL     IDENTIFICATION  Patient Name: [name] Birthdate: 1946/01/02 Sex: male Admission Date (Current Location): 12/15/2020  [facility] and [place] Number:  Herbalist and Address:  [facility],  [place], [place]      Provider Number: [id number]  Attending Physician Name and Address:  Paralee Cancel, MD  Relative Name and Phone Number:  Thadis Brodie (wife) Ph: [phone]    Current Level of Care: Hospital Recommended Level of Care: [place] Prior Approval Number:    Date Approved/Denied:   PASRR Number: [id number]  Discharge Plan: SNF    Current Diagnoses: Patient Active Problem List   Diagnosis Date Noted   S/P revision of total knee, 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   Malignant neoplasm of prostate ([place]) 08/28/2020   Diabetes ([place]) 03/20/2019   Periprosthetic supracondylar fracture of femur, left 03/18/2019   Pure hypercholesterolemia    Essential hypertension    Coronary atherosclerosis of native coronary artery 04/22/2001    Orientation RESPIRATION BLADDER Height & Weight     Self, Time, Situation, Place  Normal Continent Weight: 177 lb (80.3 kg) Height:  '5\' 10"'$  (177.8 cm)  BEHAVIORAL SYMPTOMS/MOOD NEUROLOGICAL BOWEL NUTRITION STATUS      Continent Diet (Regular diet)  AMBULATORY STATUS COMMUNICATION OF NEEDS Skin   Limited Assist Verbally Surgical wounds                       Personal Care Assistance Level of Assistance  Bathing, Feeding, Dressing Bathing Assistance: Limited assistance Feeding assistance: Independent Dressing Assistance: Limited assistance     Functional Limitations Info  Sight, Hearing, Speech Sight Info: Impaired Hearing Info: Impaired Speech Info: Adequate    SPECIAL CARE FACTORS FREQUENCY  PT (By licensed PT), OT (By licensed OT)     PT Frequency: 5x's/week OT Frequency: 5x's/week            Contractures  Contractures Info: Not present    Additional Factors Info  Code Status, Allergies, Psychotropic Code Status Info: Full Allergies Info: Lisinopril Psychotropic Info: Neurontin         Current Medications (12/16/2020):  This is the current hospital active medication list Current Facility-Administered Medications  Medication Dose Route Frequency Provider Last Rate Last Admin   0.9 %  sodium chloride infusion (Manually program via Guardrails IV Fluids)   Intravenous Once [place] Cage, CRNA       0.9 %  sodium chloride infusion   Intravenous Continuous Irving Copas, PA-C 75 mL/hr at 12/16/20 0002 New Bag at 12/16/20 0002   acarbose (PRECOSE) tablet 100 mg  100 mg Oral TID PC Costella Hatcher R, PA-C   100 mg at 12/16/20 0815   acetaminophen (TYLENOL) tablet 325-650 mg  325-650 mg Oral Q6H PRN Irving Copas, PA-C   650 mg at 12/16/20 1238   allopurinol (ZYLOPRIM) tablet 300 mg  300 mg Oral Daily Irving Copas, PA-C   300 mg at 12/16/20 0946   amLODipine (NORVASC) tablet 10 mg  10 mg Oral QHS Costella Hatcher R, PA-C       atorvastatin (LIPITOR) tablet 80 mg  80 mg Oral Daily Irving Copas, PA-C   80 mg at 12/16/20 [id number]   bisacodyl (DULCOLAX) suppository 10 mg  10 mg Rectal Daily PRN Irving Copas, PA-C       carvedilol (COREG) tablet 25 mg  25 mg Oral BID WC Donovan,  Nicola Girt, PA-C   25 mg at 12/16/20 0815   diphenhydrAMINE (BENADRYL) 12.5 MG/5ML elixir 12.5-25 mg  12.5-25 mg Oral Q4H PRN Irving Copas, PA-C   25 mg at 12/16/20 1241   docusate sodium (COLACE) capsule 100 mg  100 mg Oral BID Irving Copas, PA-C   100 mg at 12/16/20 [id number]   ferrous sulfate tablet 325 mg  325 mg Oral TID PC Irving Copas, PA-C   325 mg at 12/16/20 1238   fluticasone (FLONASE) 50 MCG/ACT nasal spray 1 spray  1 spray Each Nare Daily Irving Copas, PA-C       gabapentin (NEURONTIN) capsule 600 mg  600 mg Oral QHS Irving Copas, PA-C   600 mg at 12/15/20 2341   glipiZIDE  (GLUCOTROL XL) 24 hr tablet 5 mg  5 mg Oral Q breakfast Irving Copas, PA-C       HYDROmorphone (DILAUDID) injection 0.5-1 mg  0.5-1 mg Intravenous Q4H PRN Irving Copas, PA-C   1 mg at 12/15/20 2342   irbesartan (AVAPRO) tablet 75 mg  75 mg Oral Daily Irving Copas, PA-C   75 mg at 12/16/20 [id number]   isosorbide mononitrate (IMDUR) 24 hr tablet 45 mg  45 mg Oral Daily Irving Copas, PA-C   45 mg at 12/16/20 0945   loratadine (CLARITIN) tablet 10 mg  10 mg Oral Daily Paralee Cancel, MD   10 mg at 12/16/20 [id number]   menthol-cetylpyridinium (CEPACOL) lozenge 3 mg  1 lozenge Oral PRN Irving Copas, PA-C       Or   phenol (CHLORASEPTIC) mouth spray 1 spray  1 spray Mouth/Throat PRN Irving Copas, PA-C       metFORMIN (GLUCOPHAGE-XR) 24 hr tablet 500 mg  500 mg Oral BID WC Costella Hatcher R, PA-C   500 mg at 12/16/20 0815   methocarbamol (ROBAXIN) tablet 500 mg  500 mg Oral Q6H PRN Irving Copas, PA-C   500 mg at 12/16/20 0945   Or   methocarbamol (ROBAXIN) 500 mg in dextrose 5 % 50 mL IVPB  500 mg Intravenous Q6H PRN Irving Copas, PA-C 100 mL/hr at 12/15/20 2100 500 mg at 12/15/20 2100   metoCLOPramide (REGLAN) tablet 5-10 mg  5-10 mg Oral Q8H PRN Irving Copas, PA-C       Or   metoCLOPramide (REGLAN) injection 5-10 mg  5-10 mg Intravenous Q8H PRN Irving Copas, PA-C       mometasone-formoterol (DULERA) 200-5 MCG/ACT inhaler 2 puff  2 puff Inhalation BID Irving Copas, PA-C   2 puff at 12/16/20 [id number]   montelukast (SINGULAIR) tablet 10 mg  10 mg Oral QHS Irving Copas, PA-C   10 mg at 12/15/20 2358   nitroGLYCERIN (NITROSTAT) SL tablet 0.4 mg  0.4 mg Sublingual Q5 min PRN Irving Copas, PA-C       ondansetron [facility]) tablet 4 mg  4 mg Oral Q6H PRN Irving Copas, PA-C       Or   ondansetron [facility]) injection 4 mg  4 mg Intravenous Q6H PRN Irving Copas, PA-C       oxyCODONE (Oxy IR/ROXICODONE) immediate release tablet 10-15 mg  10-15 mg Oral Q4H  PRN Irving Copas, PA-C   15 mg at 12/16/20 1435   oxyCODONE (Oxy IR/ROXICODONE) immediate release tablet 5-10 mg  5-10 mg Oral Q4H PRN Irving Copas, PA-C       pantoprazole (PROTONIX) EC tablet  40 mg  40 mg Oral Daily Irving Copas, PA-C   40 mg at 12/16/20 [id number]   polyethylene glycol (MIRALAX / GLYCOLAX) packet 17 g  17 g Oral Daily PRN Irving Copas, PA-C       rivaroxaban (XARELTO) tablet 10 mg  10 mg Oral Q breakfast Irving Copas, PA-C   10 mg at 12/16/20 0815   torsemide (DEMADEX) tablet 20 mg  20 mg Oral BID Irving Copas, PA-C   20 mg at 12/16/20 0815   zolpidem (AMBIEN) tablet 5 mg  5 mg Oral QHS PRN Irving Copas, PA-C         Discharge Medications: Please see discharge summary for a list of discharge medications.  Relevant Imaging Results:  Relevant Lab Results:   Additional Information SSN: [id number]  Sherie Don, LCSW

## 2020-12-16 NOTE — Progress Notes (Signed)
Subjective: 1 Day Post-Op Procedure(s) (LRB): LEFT TOTAL KNEE REVISION WITH REMOVAL OF HARDWARE (Left) Patient reports pain as severe.   Patient seen in rounds with Dr. Alvan Dame. Patient is well, and has had no acute complaints or problems. No acute events overnight. Foley catheter removed. He reports significant pain in the leg.  We will start therapy today.   Objective: Vital signs in last 24 hours: Temp:  [97.3 F (36.3 C)-98.4 F (36.9 C)] 97.9 F (36.6 C) (08/31 0407) Pulse Rate:  [59-73] 70 (08/31 0407) Resp:  [10-22] 17 (08/31 0407) BP: (112-168)/(49-75) 129/66 (08/31 0407) SpO2:  [93 %-100 %] 95 % (08/31 0407) Weight:  [80.3 kg] 80.3 kg (08/31 0022)  Intake/Output from previous day:  Intake/Output Summary (Last 24 hours) at 12/16/2020 0750 Last data filed at 12/16/2020 0557 Gross per 24 hour  Intake 4223.4 ml  Output 2800 ml  Net 1423.4 ml     Intake/Output this shift: No intake/output data recorded.  Labs: Recent Labs    12/16/20 0332  HGB 8.7*   Recent Labs    12/16/20 0332  WBC 19.7*  RBC 2.67*  HCT 25.1*  PLT 128*   Recent Labs    12/16/20 0332  NA 138  K 4.5  CL 108  CO2 21*  BUN 17  CREATININE 1.09  GLUCOSE 364*  CALCIUM 7.9*   No results for input(s): LABPT, INR in the last 72 hours.  Exam: General - Patient is Alert and Oriented Extremity - Neurologically intact Sensation intact distally Intact pulses distally Dorsiflexion/Plantar flexion intact Compartments soft Dressing - dressing C/D/I He has a bulky dressing on with a mepilex over the lateral dressing and an aquacel over the anterior knee incision.  Motor Function - intact, moving foot and toes well on exam.   Past Medical History:  Diagnosis Date   Arthritis    Asthma    Cancer ([place])    Prostate   COPD (chronic obstructive pulmonary disease) ([place])    Coronary atherosclerosis of native coronary artery 04/22/2001      Cardiac Catheterization   Diverticulitis     Encounter for long-term (current) use of insulin ([facility]CC)    Esophageal reflux    Gout, unspecified    History of kidney stones    Old myocardial infarction    2   Personal history of tobacco use, presenting hazards to health    Postsurgical percutaneous transluminal coronary angioplasty status    Prostate cancer ([place])    Pure hypercholesterolemia    Skin cancer    Sleep apnea    CPAP  also o2 at hs at times when not wearing cpap   Type II or unspecified type diabetes mellitus without mention of complication, not stated as uncontrolled    TYPE 11   Unspecified essential hypertension     Assessment/Plan: 1 Day Post-Op Procedure(s) (LRB): LEFT TOTAL KNEE REVISION WITH REMOVAL OF HARDWARE (Left) Active Problems:   S/P revision of total knee, left  Estimated body mass index is 25.4 kg/m as calculated from the following:   Height as of this encounter: '5\' 10"'$  (1.778 m).   Weight as of this encounter: 80.3 kg. Advance diet Up with therapy    DVT Prophylaxis - Xarelto x2 weeks, followed by ASA 81 BID x4 weeks  PWB 50% LLE  ABLA: Received 2 units intra-op. Hgb 8.7 this AM.  Plan to get up with PT today to begin mobilizing and working on [facility] status. Appreciate recommendations on PT needs. Continue  pain control.   Griffith Citron, PA-C Orthopedic Surgery [phone] 12/16/2020, 7:50 AM

## 2020-12-16 NOTE — Progress Notes (Signed)
Physical Therapy Treatment Patient Details Name: [name] MRN: [id number] DOB: 09-May-1945 Today's Date: 12/16/2020    History of Present Illness Pt is a 75 year old s/p LEFT TOTAL KNEE REVISION WITH REMOVAL OF HARDWARE on 12/15/20.  PMHx: COPD, CAD, HTN, gout, DMII, prostate cancer    PT Comments    Pt assisted with ambulating prior to returning to bed.  Pt requiring at least min assist for safe mobility at this time.  Continue to recommend SNF upon d/c.    Follow Up Recommendations  SNF     Equipment Recommendations  None recommended by PT    Recommendations for Other Services       Precautions / Restrictions Precautions Precautions: Fall;Knee Restrictions Weight Bearing Restrictions: Yes LLE Weight Bearing: Partial weight bearing LLE Partial Weight Bearing Percentage or Pounds: 50%    Mobility  Bed Mobility Overal bed mobility: Needs Assistance Bed Mobility: Sit to Supine     Supine to sit: Min assist Sit to supine: Mod assist   General bed mobility comments: assist for Bil LEs onto bed    Transfers Overall transfer level: Needs assistance Equipment used: Rolling walker (2 wheeled) Transfers: Sit to/from Stand Sit to Stand: Min assist         General transfer comment: assist to rise and steady, cues for UE and LE positioning, assist also to control descent  Ambulation/Gait Ambulation/Gait assistance: Min assist Gait Distance (Feet): 80 Feet Assistive device: Rolling walker (2 wheeled) Gait Pattern/deviations: Step-to pattern;Decreased stance time - left;Antalgic;Trunk flexed Gait velocity: decr   General Gait Details: verbal cues for sequence, RW positioning, posture; pt fatigued upon returning to room and required min assist for stability and weakness   Stairs             Wheelchair Mobility    Modified Rankin (Stroke Patients Only)       Balance                                            Cognition  Arousal/Alertness: Awake/alert Behavior During Therapy: WFL for tasks assessed/performed Overall Cognitive Status: Within Functional Limits for tasks assessed                                        Exercises Total Joint Exercises Knee Flexion: AAROM;Seated;10 reps;Left Goniometric ROM: approx 75* AAROM left knee flexion in sitting    General Comments        Pertinent Vitals/Pain Pain Assessment: 0-10 Pain Score: 5  Pain Location: left knee Pain Descriptors / Indicators: Sore;Aching Pain Intervention(s): Repositioned;Monitored during session;Ice applied;Premedicated before session    [place] expects to be discharged to:: Private residence Living Arrangements: Spouse/significant other   Type of Home: House Home Access: [place]: One [place]: Environmental consultant - 2 wheels;Cane - single point;Wheelchair - manual;Bedside commode      Prior Function Level of Independence: Independent          PT Goals (current goals can now be found in the care plan section) Acute Rehab PT Goals PT Goal Formulation: With patient Time For Goal Achievement: 12/23/20 Potential to Achieve Goals: Good Progress towards PT goals: Progressing toward goals    Frequency    7X/week      PT  Plan Current plan remains appropriate    Co-evaluation              AM-PAC PT "6 Clicks" Mobility   Outcome Measure  Help needed turning from your back to your side while in a flat bed without using bedrails?: A Little Help needed moving from lying on your back to sitting on the side of a flat bed without using bedrails?: A Little Help needed moving to and from a bed to a chair (including a wheelchair)?: A Little Help needed standing up from a chair using your arms (e.g., wheelchair or bedside chair)?: A Little Help needed to walk in hospital room?: A Little Help needed climbing 3-5 steps with a railing? : A Lot 6 Click Score: 17     End of Session Equipment Utilized During Treatment: Gait belt Activity Tolerance: Patient tolerated treatment well Patient left: with call bell/phone within reach;in bed Nurse Communication: Mobility status PT Visit Diagnosis: Other abnormalities of gait and mobility (R26.89)     Time: [id number] PT Time Calculation (min) (ACUTE ONLY): 15 min  Charges:  $Gait Training: 8-22 mins                    Arlyce Dice, DPT Acute Rehabilitation Services Pager: [phone] Office: [phone]    York Ram E 12/16/2020, 3:32 PM

## 2020-12-16 NOTE — Evaluation (Signed)
Physical Therapy Evaluation Patient Details Name: [name] MRN: [id number] DOB: 24-Dec-1945 Today's Date: 12/16/2020   History of Present Illness  Pt is a 75 year old s/p LEFT TOTAL KNEE REVISION WITH REMOVAL OF HARDWARE on 12/15/20.  PMHx: COPD, CAD, HTN, gout, DMII, prostate cancer  Clinical Impression  Patient is s/p above surgery resulting in functional limitations due to the deficits listed below (see PT Problem List). Patient will benefit from skilled PT to increase their independence and safety with mobility to allow discharge to the venue listed below.  Pt assisted with ambulating in hallway short distance.  Pt educated on performing and maintaining PWB status.  Pt reports he plans to d/c to SNF for rehab.  Spouse has had multiple surgeries and not able to physically assist pt at home.      Follow Up Recommendations SNF    Equipment Recommendations  None recommended by PT    Recommendations for Other Services       Precautions / Restrictions Precautions Precautions: Fall;Knee Restrictions Weight Bearing Restrictions: Yes LLE Weight Bearing: Partial weight bearing LLE Partial Weight Bearing Percentage or Pounds: 50%      Mobility  Bed Mobility Overal bed mobility: Needs Assistance Bed Mobility: Supine to Sit     Supine to sit: Min assist     General bed mobility comments: assist for Lt LE over EOB    Transfers Overall transfer level: Needs assistance Equipment used: Rolling walker (2 wheeled) Transfers: Sit to/from Stand Sit to Stand: Min assist         General transfer comment: assist to rise and steady, cues for UE and LE positioning, assist also to control descent  Ambulation/Gait Ambulation/Gait assistance: Min guard Gait Distance (Feet): 45 Feet Assistive device: Rolling walker (2 wheeled) Gait Pattern/deviations: Step-to pattern;Decreased stance time - left;Antalgic;Trunk flexed Gait velocity: decr   General Gait Details: verbal cues for  sequence, RW positioning, posture; distance to tolerance  Stairs            Wheelchair Mobility    Modified Rankin (Stroke Patients Only)       Balance                                             Pertinent Vitals/Pain Pain Assessment: 0-10 Pain Score: 3  Pain Location: left knee Pain Descriptors / Indicators: Sore Pain Intervention(s): Repositioned;Monitored during session;Premedicated before session;Ice applied    Home Living Family/patient expects to be discharged to:: Private residence Living Arrangements: Spouse/significant other   Type of Home: [place]: One [place]: Environmental consultant - 2 wheels;Cane - single point;Wheelchair - manual;Bedside commode      Prior Function Level of Independence: Independent               Hand Dominance        Extremity/Trunk Assessment        Lower Extremity Assessment Lower Extremity Assessment: LLE deficits/detail LLE Deficits / Details: required assist for bed mobility, pt aware to keep knee in extension at rest; did not perform ROM yet as clarifying with MD       Communication   Communication: No difficulties  Cognition Arousal/Alertness: Awake/alert Behavior During Therapy: WFL for tasks assessed/performed Overall Cognitive Status: Within Functional Limits for tasks assessed  General Comments      Exercises     Assessment/Plan    PT Assessment Patient needs continued PT services  PT Problem List Decreased strength;Decreased mobility;Decreased activity tolerance;Decreased balance;Pain;Decreased knowledge of use of DME;Decreased knowledge of precautions       PT Treatment Interventions Stair training;Gait training;DME instruction;Therapeutic exercise;Balance training;Functional mobility training;Therapeutic activities;Patient/family education    PT Goals (Current goals can be  found in the Care Plan section)  Acute Rehab PT Goals PT Goal Formulation: With patient Time For Goal Achievement: 12/23/20 Potential to Achieve Goals: Good    Frequency 7X/week   Barriers to discharge        Co-evaluation               AM-PAC PT "6 Clicks" Mobility  Outcome Measure Help needed turning from your back to your side while in a flat bed without using bedrails?: A Little Help needed moving from lying on your back to sitting on the side of a flat bed without using bedrails?: A Little Help needed moving to and from a bed to a chair (including a wheelchair)?: A Little Help needed standing up from a chair using your arms (e.g., wheelchair or bedside chair)?: A Little Help needed to walk in hospital room?: A Little Help needed climbing 3-5 steps with a railing? : A Lot 6 Click Score: 17    End of Session Equipment Utilized During Treatment: Gait belt Activity Tolerance: Patient tolerated treatment well Patient left: in chair;with call bell/phone within reach;with chair alarm set;with family/visitor present Nurse Communication: Mobility status PT Visit Diagnosis: Other abnormalities of gait and mobility (R26.89)    Time: 1037-1100 PT Time Calculation (min) (ACUTE ONLY): 23 min   Charges:   PT Evaluation $PT Eval Low Complexity: 1 Low        Kati PT, DPT Acute Rehabilitation Services Pager: [phone] Office: [phone]   Trena Platt 12/16/2020, 12:41 PM

## 2020-12-16 NOTE — Anesthesia Postprocedure Evaluation (Signed)
Anesthesia Post Note  Patient: [name]  Procedure(s) Performed: LEFT TOTAL KNEE REVISION WITH REMOVAL OF HARDWARE (Left: Knee)     Patient location during evaluation: PACU Anesthesia Type: Spinal Level of consciousness: awake and alert, awake and oriented Pain management: pain level controlled Vital Signs Assessment: post-procedure vital signs reviewed and stable Respiratory status: spontaneous breathing, nonlabored ventilation and respiratory function stable Cardiovascular status: blood pressure returned to baseline and stable Postop Assessment: no apparent nausea or vomiting, patient able to bend at knees, spinal receding, no backache and no headache Anesthetic complications: no   No notable events documented.  Last Vitals:  Vitals:   12/15/20 2319 12/15/20 2324  BP: (!) 143/69   Pulse: 66 68  Resp: (!) 22 15  Temp: 36.6 C   SpO2: 98% 100%    Last Pain:  Vitals:   12/16/20 0008  TempSrc:   PainSc: 5                  Catalina Gravel

## 2020-12-17 ENCOUNTER — Encounter (HOSPITAL_COMMUNITY): Payer: Self-pay | Admitting: Orthopedic Surgery

## 2020-12-17 LAB — CBC
HCT: 20.7 % — ABNORMAL LOW (ref 39.0–52.0)
Hemoglobin: 7.3 g/dL — ABNORMAL LOW (ref 13.0–17.0)
MCH: 32.6 pg (ref 26.0–34.0)
MCHC: 35.3 g/dL (ref 30.0–36.0)
MCV: 92.4 fL (ref 80.0–100.0)
Platelets: 114 10*3/uL — ABNORMAL LOW (ref 150–400)
RBC: 2.24 MIL/uL — ABNORMAL LOW (ref 4.22–5.81)
RDW: 14.4 % (ref 11.5–15.5)
WBC: 14.5 10*3/uL — ABNORMAL HIGH (ref 4.0–10.5)
nRBC: 0 % (ref 0.0–0.2)

## 2020-12-17 LAB — TYPE AND SCREEN
ABO/RH(D): O POS
Antibody Screen: NEGATIVE
Unit division: 0
Unit division: 0
Unit division: 0
Unit division: 0

## 2020-12-17 LAB — BPAM RBC
Blood Product Expiration Date: 202209282359
Blood Product Expiration Date: 202209292359
Blood Product Expiration Date: 202210022359
Blood Product Expiration Date: 202210022359
ISSUE DATE / TIME: 202208301746
ISSUE DATE / TIME: 202208301746
Unit Type and Rh: 5100
Unit Type and Rh: 5100
Unit Type and Rh: 5100
Unit Type and Rh: 5100

## 2020-12-17 LAB — HEMOGLOBIN AND HEMATOCRIT, BLOOD
HCT: 24.5 % — ABNORMAL LOW (ref 39.0–52.0)
Hemoglobin: 8.6 g/dL — ABNORMAL LOW (ref 13.0–17.0)

## 2020-12-17 MED ORDER — METHOCARBAMOL 500 MG PO TABS
500.0000 mg | ORAL_TABLET | Freq: Four times a day (QID) | ORAL | 0 refills | Status: DC | PRN
Start: 2020-12-17 — End: 2023-10-17

## 2020-12-17 MED ORDER — DOCUSATE SODIUM 100 MG PO CAPS: 100.0000 mg | ORAL_CAPSULE | Freq: Two times a day (BID) | ORAL | 0 refills | Status: DC

## 2020-12-17 MED ORDER — SODIUM CHLORIDE 0.9% IV SOLUTION: Freq: Once | INTRAVENOUS | Status: DC

## 2020-12-17 MED ORDER — OXYCODONE HCL 5 MG PO TABS: 15.0000 mg | ORAL_TABLET | ORAL | Status: DC | PRN

## 2020-12-17 MED ORDER — POLYETHYLENE GLYCOL 3350 17 G PO PACK: 17.0000 g | PACK | Freq: Every day | ORAL | 0 refills | Status: DC | PRN

## 2020-12-17 MED ORDER — OXYCODONE HCL 10 MG PO TABS: 10.0000 mg | ORAL_TABLET | ORAL | 0 refills | Status: DC | PRN

## 2020-12-17 MED ORDER — RIVAROXABAN 10 MG PO TABS: 10.0000 mg | ORAL_TABLET | Freq: Every day | ORAL | 0 refills | Status: DC

## 2020-12-17 MED ORDER — FERROUS SULFATE 325 (65 FE) MG PO TABS
325.0000 mg | ORAL_TABLET | Freq: Three times a day (TID) | ORAL | 0 refills | Status: DC
Start: 2020-12-17 — End: 2021-04-26

## 2020-12-17 MED ORDER — COVID-19 MRNA VACC (MODERNA) 100 MCG/0.5ML IM SUSP
0.5000 mL | Freq: Once | INTRAMUSCULAR | Status: DC
  Filled 2020-12-17 (×2): qty 0.5

## 2020-12-17 NOTE — Op Note (Signed)
NAME: [name], [name] MEDICAL RECORD NO: [id number] ACCOUNT NO: [id number] DATE OF BIRTH: 06/29/1945 FACILITY: [facility]: [id number] PHYSICIAN: Pietro Cassis. 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  PROCEDURES:   1.  Removal of left distal femoral hardware including multiple screws and lateral distal femoral plate. 2.  Left total knee revision.  COMPONENTS USED:  DePuy Attune revision knee system with a size 6 left Attune revision femoral component with 4 mm posterior, medial and lateral augments, a 22 x 110 mm press-fit stem with the femoral component cemented distally.  A size 6 Attune  revision tray with a 53 fully porous coated sleeve with a 20 x 60 mm press-fit stem.  We used a 10 mm revision insert for a size 6 femur.  SURGEON:  Pietro Cassis. Alvan Dame, MD.  ASSISTANT:  Costella Hatcher, PA-C.  Note that Ms. [name] was present for the entirety of the case for preoperative positioning, perioperative management of upper extremity, general facilitation of the case and primary wound closure.  ANESTHESIA:  Spinal.  BLOOD LOSS:  There was approximately 1700 mL of blood loss.  BLOOD PRODUCTS:  The patient did receive at least one unit of blood in the OR, but remained hemodynamically stable throughout the case.  DRAINS:  None.  COMPLICATIONS:  None apparent.  TOURNIQUET:  Up for total of 85 minutes at 250 mmHg.  INDICATIONS FOR PROCEDURE:  The patient is a 75 year old male who was referred for second opinion evaluation and management of a complex left knee problem.  He had a history of left total knee arthroplasty that was complicated by distal femur fracture.   Initially, he was seen and  evaluated and significant concerns were raised regarding malunion versus nonunion.  We had discussed the procedure of revising his total knee replacement with using a press-fit stem and/or sleeves to bypass the fracture site as  well as removal of hardware.  At the time of his initial booking his A1c level was elevated.  His surgery was thus canceled or postponed until it was improved.  He was subsequently rescheduled for surgery.  At this point, it was felt that the nonunion  was less of a concern than possible malunion.  The procedure was scheduled and continued as planned.  We reviewed the risk of infection, DVT, component failure, need for future surgeries.  We discussed the complexity of the procedure as well as the  postoperative course with possible partial limiting his weightbearing.  Consent was obtained for benefit of pain relief.  DESCRIPTION OF PROCEDURE:  The patient was brought to the operative theater.  Once adequate anesthesia, preoperative antibiotics, Ancef administered as well as tranexamic acid and Decadron he was positioned supine.  Initially, we prepped out the perineum  to be able to expose the lateral thigh for removal of hardware.  We will plan to use a sterile tourniquet during the procedure for the knee replacement component.  The left lower extremity was then prepped and draped in sterile fashion.  Following a  timeout, identifying the patient, planned procedure, and extremity the leg was exsanguinated to 250 mmHg.  The sterile tourniquet was elevated.  I first attended to the knee replacement itself.  Following initial exposure and scar  debridement once we had  the knee fully exposed with the patella subluxated laterally, used a thin ACL saw and undermined the bone cement interface on the tibia and the distal femur.  I was able to elevate the tibial component without significant bone loss.  On the femoral  side, we identified that the distal femur fracture appeared to be  healed.  There was no micromotion.  I was able to remove the femoral component, which had fallen into an extended position on the distal femur.  We removed this without significant bone  loss.  Once we had this and the remaining cement was removed, we turned the attention to the lateral thigh.  This was done for hardware removal.  The tourniquet was let down and the knee packed.  I then exposed initially, the distal third of the femur  using the old incision.  The proximal 2 screws were placed with a smaller incision.  Ultimately, I then ended up extending the incision fully laterally to expose the entire aspect of the lateral thigh.  Soft tissue dissection was carried down to the  iliotibial band.  The vastus lateralis was elevated as well as incised through to expose the plate.  Once the plate was exposed, we found that there was some bone that had overgrown over the plate.  I was able to identify and remove all of the screws.  I  had to use an osteotome to remove bone around the plate.  Ultimately, I was able to remove the plate without bone loss and again identifying that the distal femur remained stable.  Given these findings, I was able to proceed with revision of the knee  replacement.  The knee was revised for protocol for the Attune revision knee system.  I had to ream up to 22 mm on the femur to get bone purchase for press-fit stem.  On the tibial side, we stopped at 20 mm.  I broached following a proximal tibial cut  and revisiting this I broached to a 53 mm press-fit, which sat at the cut surface.  A trial tray was placed.  I then prepared the femur with the size 6 cutting block positioned based off the proximal tibia cut, and the tibial tray in place.  Following  her visiting these cuts, we did a trial reduction.  We did determine that we would use 4 mm posterior augments medially and laterally.  During the trial reduction, we identified that we would either be using the #8 or the 10 mm insert.   I retained his  old patella, which did track through the trochlea without application of pressure.  Given these findings, the trial components were removed.  We copiously irrigated the knee and the lateral wound with normal saline solution.  The final components were  opened and configured on the back table under direct supervision.  The final components were then impacted into position.  The tibia first followed by the femur.  I did use 2 batches of cement on the distal femur.  The knee was then brought to extension  with a 10 mm insert.  The knee came to full extension.  Extruded cement was removed.  Once the cement had fully cured, we selected the revision 10 mm insert and placed it into the tibial tray and reduced the knee.  Once this was done, the knee was again  irrigated with normal saline solution.  At this point, an extensive amount of time was necessary for closing the  wounds.  The knee was closed per routine, using #1 Vicryl and Stratafix sutures.  The remainder of the wound was closed with 2-0 Vicryl and running Monocryl.  The lateral wound was  closed by laid in the vastus lateralis over the lateral aspect of the femur.  We then reapproximated what was present for his iliotibial band from the proximal to distal extent trying to close up the previously herniated area of muscle where the stab  incision was made for the proximal 2 screws.  We then reapproximated the remainder of the wound with 2-0 Vicryl and staples laterally.  The leg was dressed into a bulky dressing.  He was then brought to the recovery room in stable condition, tolerating  the procedure well.  Postoperatively, he will be partial weightbearing for at least 6-8 weeks to allow for healing.  Range of motion will be permitted.  We will see him back in the office in 2 weeks.  I will likely place him on antibiotics due to the high risk nature of this  procedure for at least 7 to 10 days.   PUS D: 12/17/2020 10:25:09 am T:  12/17/2020 5:18:00 pm  JOB: [id number]

## 2020-12-17 NOTE — TOC Progression Note (Signed)
Transition of Care [facility]) - Progression Note   Patient Details  Name: [name] MRN: [id number] Date of Birth: 11-Jul-1945  Transition of Care [place] [facility]) CM/SW [place], LCSW Phone Number: 12/17/2020, 3:02 PM  Clinical Narrative: CSW spoke with Mardene Celeste in admissions regarding SNF. Per Katheren Shams does not have any available beds and will not have any for several days. CSW met with patient and his wife to go over other bed offers. Patient and wife selected [facility] for rehab. CSW spoke with Ebony Hail in admissions to confirm bed availability. Per Ebony Hail, the facility can accept the patient tomorrow pending a negative COVID test and a COVID booster. Patient is agreeable to a COVID booster.  CSW called Bernadene Bell to start insurance authorization. Reference ID# is [id number]. Clinicals faxed to [facility] for review. TOC awaiting insurance authorization.  Expected Discharge Plan: [place] Barriers to Discharge: Continued Medical Work up, SNF Pending bed offer, Ship broker  Expected Discharge Plan and Services Expected Discharge Plan: [place] In-house Referral: Clinical Social Work Post Acute Care Choice: [place] Living arrangements for the past 2 months: Single Family Home             DME Arranged: N/A DME Agency: NA  Readmission Risk Interventions No flowsheet data found.

## 2020-12-17 NOTE — Progress Notes (Signed)
Physical Therapy Treatment Patient Details Name: [name] MRN: [id number] DOB: [birth year] Today's Date: 12/17/2020    History of Present Illness Pt is a 75 year old s/p LEFT TOTAL KNEE REVISION WITH REMOVAL OF HARDWARE on 12/15/20.  PMHx: COPD, CAD, HTN, gout, DMII, prostate cancer    PT Comments    Pt had just finished one unit PRBCs on arrival.  Pt assisted with ambulating in hallway short distance and then returned to bed.  Pt reports fatigue from not sleeping last night.  Pt to receive one more unit of PRBCs.  Continue to recommend SNF upon d/c.    Follow Up Recommendations  SNF     Equipment Recommendations  None recommended by PT    Recommendations for Other Services       Precautions / Restrictions Precautions Precautions: Fall;Knee Restrictions Weight Bearing Restrictions: Yes LLE Weight Bearing: Partial weight bearing LLE Partial Weight Bearing Percentage or Pounds: 50%    Mobility  Bed Mobility Overal bed mobility: Needs Assistance Bed Mobility: Supine to Sit;Sit to Supine     Supine to sit: Min assist Sit to supine: Mod assist   General bed mobility comments: assist for Bil LEs onto bed    Transfers Overall transfer level: Needs assistance Equipment used: Rolling walker (2 wheeled) Transfers: Sit to/from Stand Sit to Stand: Min assist         General transfer comment: assist to rise and steady, cues for UE and LE positioning, assist also to control descent  Ambulation/Gait Ambulation/Gait assistance: Min assist Gait Distance (Feet): 70 Feet Assistive device: Rolling walker (2 wheeled) Gait Pattern/deviations: Step-to pattern;Decreased stance time - left;Antalgic;Trunk flexed Gait velocity: decr   General Gait Details: verbal cues for sequence, RW positioning, posture; min assist for stability and weakness   Stairs             Wheelchair Mobility    Modified Rankin (Stroke Patients Only)       Balance                                             Cognition Arousal/Alertness: Awake/alert Behavior During Therapy: WFL for tasks assessed/performed Overall Cognitive Status: Within Functional Limits for tasks assessed                                        Exercises      General Comments        Pertinent Vitals/Pain Pain Assessment: 0-10 Pain Score: 5  Pain Location: left knee Pain Descriptors / Indicators: Sore;Aching Pain Intervention(s): Repositioned;Monitored during session    Home Living                      Prior Function            PT Goals (current goals can now be found in the care plan section) Progress towards PT goals: Progressing toward goals    Frequency    7X/week      PT Plan Current plan remains appropriate    Co-evaluation              AM-PAC PT "6 Clicks" Mobility   Outcome Measure  Help needed turning from your back to your side while in a flat bed without using bedrails?: A  Little Help needed moving from lying on your back to sitting on the side of a flat bed without using bedrails?: A Little Help needed moving to and from a bed to a chair (including a wheelchair)?: A Little Help needed standing up from a chair using your arms (e.g., wheelchair or bedside chair)?: A Little Help needed to walk in hospital room?: A Little Help needed climbing 3-5 steps with a railing? : A Lot 6 Click Score: 17    End of Session Equipment Utilized During Treatment: Gait belt Activity Tolerance: Patient tolerated treatment well Patient left: with call bell/phone within reach;in bed;with family/visitor present Nurse Communication: Mobility status PT Visit Diagnosis: Other abnormalities of gait and mobility (R26.89)     Time: [id number] PT Time Calculation (min) (ACUTE ONLY): 22 min  Charges:  $Gait Training: 8-22 mins                     [name], DPT Acute Rehabilitation Services Pager: [phone] Office:  [phone]    York Ram E 12/17/2020, 3:18 PM

## 2020-12-17 NOTE — Progress Notes (Signed)
Chaplain engaged in an initial visit with [name] and his two cousins.  Chaplain learned about where Carla is from and his former line of work with trees.  [name] noted that he is hoping to get into a rehab close to home.  He has been feeling restless already because he is used to being active and mobile.  Chaplain offered listening and presence.  Chaplain will follow-up.    12/17/20 1400  Clinical Encounter Type  Visited With Patient and family together  Visit Type Initial

## 2020-12-17 NOTE — Progress Notes (Signed)
Subjective: 2 Days Post-Op Procedure(s) (LRB): LEFT TOTAL KNEE REVISION WITH REMOVAL OF HARDWARE (Left) Patient reports pain as moderate.   Patient seen in rounds for Dr. Alvan Dame. Patient is well, and has had no acute complaints or problems. No acute events overnight. Voiding without difficulty. Patient ambulated 80 feet with PT. He reports he got mildly short of breath with ambulating, but this is baseline for him. He is in agreement that SNF would be best for him.  We will continue therapy today.   Objective: Vital signs in last 24 hours: Temp:  [97.8 F (36.6 C)-98.2 F (36.8 C)] 98.2 F (36.8 C) (09/01 0526) Pulse Rate:  [71-78] 74 (09/01 0526) Resp:  [16-19] 18 (09/01 0526) BP: (121-144)/(52-62) 131/58 (09/01 0526) SpO2:  [91 %-99 %] 99 % (09/01 0526)  Intake/Output from previous day:  Intake/Output Summary (Last 24 hours) at 12/17/2020 0738 Last data filed at 12/16/2020 2046 Gross per 24 hour  Intake 1020 ml  Output 1900 ml  Net -880 ml     Intake/Output this shift: No intake/output data recorded.  Labs: Recent Labs    12/16/20 0332 12/17/20 0342  HGB 8.7* 7.3*   Recent Labs    12/16/20 0332 12/17/20 0342  WBC 19.7* 14.5*  RBC 2.67* 2.24*  HCT 25.1* 20.7*  PLT 128* 114*   Recent Labs    12/16/20 0332  NA 138  K 4.5  CL 108  CO2 21*  BUN 17  CREATININE 1.09  GLUCOSE 364*  CALCIUM 7.9*   No results for input(s): LABPT, INR in the last 72 hours.  Exam: General - Patient is Alert and Oriented Extremity - Neurologically intact Sensation intact distally Intact pulses distally Dorsiflexion/Plantar flexion intact Dressing - dressing C/D/I Motor Function - intact, moving foot and toes well on exam.   Past Medical History:  Diagnosis Date   Arthritis    Asthma    Cancer ([place])    Prostate   COPD (chronic obstructive pulmonary disease) (HCC)    Coronary atherosclerosis of native coronary artery 04/22/2001      Cardiac Catheterization    Diverticulitis    Encounter for long-term (current) use of insulin (HCC)    Esophageal reflux    Gout, unspecified    History of kidney stones    Old myocardial infarction    2   Personal history of tobacco use, presenting hazards to health    Postsurgical percutaneous transluminal coronary angioplasty status    Prostate cancer ([place])    Pure hypercholesterolemia    Skin cancer    Sleep apnea    CPAP  also o2 at hs at times when not wearing cpap   Type II or unspecified type diabetes mellitus without mention of complication, not stated as uncontrolled    TYPE 11   Unspecified essential hypertension     Assessment/Plan: 2 Days Post-Op Procedure(s) (LRB): LEFT TOTAL KNEE REVISION WITH REMOVAL OF HARDWARE (Left) Active Problems:   S/P revision of total knee, left  Estimated body mass index is 25.4 kg/m as calculated from the following:   Height as of this encounter: '5\' 10"'$  (1.778 m).   Weight as of this encounter: 80.3 kg. Advance diet Up with therapy   DVT Prophylaxis - Xarelto x2 weeks, then aspirin 81 BID x 4 weeks PWB 50%  ABLA: Hgb down to 7.3 this AM, symptomatic. Will plan to give 2 units today in preparation for d/c to SNF over the next several days.   Plan is to  go Skilled nursing facility after hospital stay. Awaiting placement. Not medically ready for discharge today.   Griffith Citron, PA-C Orthopedic Surgery [phone] 12/17/2020, 7:38 AM

## 2020-12-17 NOTE — Discharge Summary (Addendum)
Physician Discharge Summary   Patient ID: [name] MRN: [id number] DOB/AGE: June 03, 1945 75 y.o.  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  Admission Diagnoses:  Past Medical History:  Diagnosis Date   Arthritis    Asthma    Cancer ([place])    Prostate   COPD (chronic obstructive pulmonary disease) ([place])    Coronary atherosclerosis of native coronary artery 04/22/2001      Cardiac Catheterization   Diverticulitis    Encounter for long-term (current) use of insulin (HCC)    Esophageal reflux    Gout, unspecified    History of kidney stones    Old myocardial infarction    2   Personal history of tobacco use, presenting hazards to health    Postsurgical percutaneous transluminal coronary angioplasty status    Prostate cancer ([place])    Pure hypercholesterolemia    Skin cancer    Sleep apnea    CPAP  also o2 at hs at times when not wearing cpap   Type II or unspecified type diabetes mellitus without mention of complication, not stated as uncontrolled    TYPE 11   Unspecified essential hypertension    Discharge Diagnoses:   Active Problems:   S/P revision of total knee, left  Estimated body mass index is 25.4 kg/m as calculated from the following:   Height as of this encounter: '5\' 10"'$  (1.778 m).   Weight as of this encounter: 80.3 kg.  Procedure:  Procedure(s) (LRB): LEFT TOTAL KNEE REVISION WITH REMOVAL OF HARDWARE (Left)   Consults: None  HPI: [name], 75 y.o. male,  who presents with symptoms in the left knee including pain which has impacted their quality of life and ability to do activities of daily living. He has a history of a left total knee replacement that was performed in 2015 at a [facility] facility in [place]. In December 2020 after a fall he sustained a distal periprosthetic femur fracture treated with open reduction internal fixation by  Dr. Marcelino Scot. Dr. Marcelino Scot is also performed a bone grafting procedure to try to help with bone healing. He was referred from Dr. Marcelino Scot to Dr. Alvan Dame for revision surgery. The patient denies an active infection.  Laboratory Data: Admission on 12/15/2020  Component Date Value Ref Range Status   Glucose-Capillary 12/15/2020 115 (A) 70 - 99 mg/dL Final   Glucose reference range applies only to samples taken after fasting for at least 8 hours.   Order Confirmation 12/15/2020    Final                   Value:ORDER PROCESSED BY BLOOD BANK Performed at [facility], [place] [address], [place], [place] [address]    Glucose-Capillary 12/15/2020 181 (A) 70 - 99 mg/dL Final   Glucose reference range applies only to samples taken after fasting for at least 8 hours.   Comment 1 12/15/2020 Call MD NNP PA CNM   Final   WBC 12/16/2020 19.7 (A) 4.0 - 10.5 K/uL Final   RBC 12/16/2020 2.67 (A) 4.22 - 5.81 MIL/uL Final   Hemoglobin 12/16/2020 8.7 (A) 13.0 - 17.0 g/dL Final   HCT 12/16/2020 25.1 (A) 39.0 - 52.0 % Final   MCV 12/16/2020 94.0  80.0 - 100.0 fL Final   MCH 12/16/2020 32.6  26.0 - 34.0 pg Final   MCHC 12/16/2020 34.7  30.0 - 36.0 g/dL Final  RDW 12/16/2020 14.4  11.5 - 15.5 % Final   Platelets 12/16/2020 128 (A) 150 - 400 K/uL Final   nRBC 12/16/2020 0.0  0.0 - 0.2 % Final   Performed at [facility], [place] [address] [place], [place] [address]   Sodium 12/16/2020 138  135 - 145 mmol/L Final   Potassium 12/16/2020 4.5  3.5 - 5.1 mmol/L Final   Chloride 12/16/2020 108  98 - 111 mmol/L Final   CO2 12/16/2020 21 (A) 22 - 32 mmol/L Final   Glucose, Bld 12/16/2020 364 (A) 70 - 99 mg/dL Final   Glucose reference range applies only to samples taken after fasting for at least 8 hours.   BUN 12/16/2020 17  8 - 23 mg/dL Final   Creatinine, Ser 12/16/2020 1.09  0.61 - 1.24 mg/dL Final   Calcium 12/16/2020 7.9 (A) 8.9 - 10.3 mg/dL Final   GFR, Estimated 12/16/2020 >60  >60  mL/min Final   Comment: (NOTE) Calculated using the CKD-EPI Creatinine Equation (2021)    Anion gap 12/16/2020 9  5 - 15 Final   Performed at [facility], [place] [address], [place], [place] [address]   WBC 12/17/2020 14.5 (A) 4.0 - 10.5 K/uL Final   RBC 12/17/2020 2.24 (A) 4.22 - 5.81 MIL/uL Final   Hemoglobin 12/17/2020 7.3 (A) 13.0 - 17.0 g/dL Final   HCT 12/17/2020 20.7 (A) 39.0 - 52.0 % Final   MCV 12/17/2020 92.4  80.0 - 100.0 fL Final   MCH 12/17/2020 32.6  26.0 - 34.0 pg Final   MCHC 12/17/2020 35.3  30.0 - 36.0 g/dL Final   RDW 12/17/2020 14.4  11.5 - 15.5 % Final   Platelets 12/17/2020 114 (A) 150 - 400 K/uL Final   Comment: SPECIMEN CHECKED FOR CLOTS Immature Platelet Fraction may be clinically indicated, consider ordering this additional test [id number] REPEATED TO VERIFY PLATELET COUNT CONFIRMED BY SMEAR    nRBC 12/17/2020 0.0  0.0 - 0.2 % Final   Performed at [facility], [place] [address] [place], [place] [address]   ABO/RH(D) 12/17/2020 O POS   Final   Antibody Screen 12/17/2020 NEG   Final   Sample Expiration 12/17/2020 12/20/2020,2359   Final   Unit Number 12/17/2020 [id number]   Final   Blood Component Type 12/17/2020 RBC LR PHER1   Final   Unit division 12/17/2020 00   Final   Status of Unit 12/17/2020 ISSUED   Final   Transfusion Status 12/17/2020 OK TO TRANSFUSE   Final   Crossmatch Result 12/17/2020 Compatible   Final   Unit Number 12/17/2020 [id number]   Final   Blood Component Type 12/17/2020 RED CELLS,LR   Final   Unit division 12/17/2020 00   Final   Status of Unit 12/17/2020 ISSUED   Final   Transfusion Status 12/17/2020 OK TO TRANSFUSE   Final   Crossmatch Result 12/17/2020    Final                   Value:Compatible Performed at [facility], [place] [address], [place],  [address]    ISSUE DATE / TIME 12/17/2020 [id number]   Final   Blood Product Unit Number 12/17/2020  [id number]   Final   PRODUCT CODE 12/17/2020 [id number]   Final   Unit Type and Rh 12/17/2020 5100   Final   Blood Product Expiration Date 12/17/2020 [id number]   Final   ISSUE DATE / TIME 12/17/2020 [id number]   Final  Blood Product Unit Number 12/17/2020 [id number]   Final   PRODUCT CODE 12/17/2020 [id number]   Final   Unit Type and Rh 12/17/2020 5100   Final   Blood Product Expiration Date 12/17/2020 [id number]   Final  Orders Only on 12/11/2020  Component Date Value Ref Range Status   SARS Coronavirus 2 12/11/2020 RESULT: NEGATIVE   Final   Comment: RESULT: NEGATIVESARS-CoV-2 INTERPRETATION:A NEGATIVE  test result means that SARS-CoV-2 RNA was not present in the specimen above the limit of detection of this test. This does not preclude a possible SARS-CoV-2 infection and should not be used as the  sole basis for patient management decisions. Negative results must be combined with clinical observations, patient history, and epidemiological information. Optimum specimen types and timing for peak viral levels during infections caused by SARS-CoV-2  have not been determined. Collection of multiple specimens or types of specimens may be necessary to detect virus. Improper specimen collection and handling, sequence variability under primers/probes, or organism present below the limit of detection may  lead to false negative results. Positive and negative predictive values of testing are highly dependent on prevalence. False negative test results are more likely when prevalence of disease is high.The expected result is NEGATIVE.Fact S                          heet for  Healthcare Providers: LocalChronicle.no Sheet for Patients: SalonLookup.es Reference Range - Negative   Hospital Outpatient Visit on 12/03/2020  Component Date Value Ref Range Status   Glucose-Capillary 12/03/2020 139 (A) 70 - 99 mg/dL Final   Glucose reference  range applies only to samples taken after fasting for at least 8 hours.  Hospital Outpatient Visit on 12/03/2020  Component Date Value Ref Range Status   MRSA, PCR 12/03/2020 POSITIVE (A) NEGATIVE Final   Comment: RESULT CALLED TO, READ BACK BY AND VERIFIED WITH: NEAL,G. '@1414'$  ON 08.18.2022 BY COHEN,K    Staphylococcus aureus 12/03/2020 POSITIVE (A) NEGATIVE Final   Comment: (NOTE) The Xpert SA Assay (FDA approved for NASAL specimens in patients 68 years of age and older), is one component of a comprehensive surveillance program. It is not intended to diagnose infection nor to guide or monitor treatment. Performed at [facility], [place] [address], [place], [place] [address]    WBC 12/03/2020 9.0  4.0 - 10.5 K/uL Final   RBC 12/03/2020 3.75 (A) 4.22 - 5.81 MIL/uL Final   Hemoglobin 12/03/2020 12.3 (A) 13.0 - 17.0 g/dL Final   HCT 12/03/2020 36.3 (A) 39.0 - 52.0 % Final   MCV 12/03/2020 96.8  80.0 - 100.0 fL Final   MCH 12/03/2020 32.8  26.0 - 34.0 pg Final   MCHC 12/03/2020 33.9  30.0 - 36.0 g/dL Final   RDW 12/03/2020 13.7  11.5 - 15.5 % Final   Platelets 12/03/2020 160  150 - 400 K/uL Final   nRBC 12/03/2020 0.0  0.0 - 0.2 % Final   Performed at [facility], [place] [address] [place], [place] [address]   Sodium 12/03/2020 139  135 - 145 mmol/L Final   Potassium 12/03/2020 3.9  3.5 - 5.1 mmol/L Final   Chloride 12/03/2020 106  98 - 111 mmol/L Final   CO2 12/03/2020 26  22 - 32 mmol/L Final   Glucose, Bld 12/03/2020 115 (A) 70 - 99 mg/dL Final   Glucose reference range applies only to samples taken after fasting for at least 8 hours.  BUN 12/03/2020 17  8 - 23 mg/dL Final   Creatinine, Ser 12/03/2020 1.31 (A) 0.61 - 1.24 mg/dL Final   Calcium 12/03/2020 9.1  8.9 - 10.3 mg/dL Final   Total Protein 12/03/2020 7.4  6.5 - 8.1 g/dL Final   Albumin 12/03/2020 3.9  3.5 - 5.0 g/dL Final   AST 12/03/2020 22  15 - 41 U/L Final   ALT 12/03/2020 19  0 -  44 U/L Final   Alkaline Phosphatase 12/03/2020 88  38 - 126 U/L Final   Total Bilirubin 12/03/2020 0.7  0.3 - 1.2 mg/dL Final   GFR, Estimated 12/03/2020 57 (A) >60 mL/min Final   Comment: (NOTE) Calculated using the CKD-EPI Creatinine Equation (2021)    Anion gap 12/03/2020 7  5 - 15 Final   Performed at [facility], [place] [address] [place], [place] [address]   ABO/RH(D) 12/03/2020 O POS   Final   Antibody Screen 12/03/2020 NEG   Final   Sample Expiration 12/03/2020 12/16/2020,2359   Final   Extend sample reason 12/03/2020    Final                   Value:NO TRANSFUSIONS OR PREGNANCY IN THE PAST 3 MONTHS Performed at [place] [address] [place], [place] [address]    Unit Number 12/03/2020 [id number]   Final   Blood Component Type 12/03/2020 RBC LR PHER2   Final   Unit division 12/03/2020 00   Final   Status of Unit 12/03/2020 ISSUED,FINAL   Final   Transfusion Status 12/03/2020 OK TO TRANSFUSE   Final   Crossmatch Result 12/03/2020 Compatible   Final   Unit Number 12/03/2020 [id number]   Final   Blood Component Type 12/03/2020 RED CELLS,LR   Final   Unit division 12/03/2020 00   Final   Status of Unit 12/03/2020 ISSUED,FINAL   Final   Transfusion Status 12/03/2020 OK TO TRANSFUSE   Final   Crossmatch Result 12/03/2020 Compatible   Final   Unit Number 12/03/2020 [id number]   Final   Blood Component Type 12/03/2020 RBC LR PHER1   Final   Unit division 12/03/2020 00   Final   Status of Unit 12/03/2020 REL FROM [facility]   Final   Transfusion Status 12/03/2020 OK TO TRANSFUSE   Final   Crossmatch Result 12/03/2020 Compatible   Final   Unit Number 12/03/2020 [id number]   Final   Blood Component Type 12/03/2020 RED CELLS,LR   Final   Unit division 12/03/2020 00   Final   Status of Unit 12/03/2020 REL FROM [facility]   Final   Transfusion Status 12/03/2020 OK TO TRANSFUSE   Final   Crossmatch Result 12/03/2020 Compatible   Final    Hgb A1c MFr Bld 12/03/2020 7.2 (A) 4.8 - 5.6 % Final   Comment: (NOTE) Pre diabetes:          5.7%-6.4%  Diabetes:              >6.4%  Glycemic control for   <7.0% adults with diabetes    Mean Plasma Glucose 12/03/2020 159.94  mg/dL Final   Performed at [facility], [place] [address] [place], [place] [address]   ISSUE DATE / TIME 12/03/2020 [id number]   Final   Blood Product Unit Number 12/03/2020 [id number]   Final   PRODUCT CODE 12/03/2020 [id number]   Final   Unit Type and Rh 12/03/2020 5100   Final   Blood Product Expiration  Date 12/03/2020 [id number]   Final   ISSUE DATE / TIME 12/03/2020 [id number]   Final   Blood Product Unit Number 12/03/2020 [id number]   Final   PRODUCT CODE 12/03/2020 [id number]   Final   Unit Type and Rh 12/03/2020 5100   Final   Blood Product Expiration Date 12/03/2020 [id number]   Final   Blood Product Unit Number 12/03/2020 [id number]   Final   PRODUCT CODE 12/03/2020 [id number]   Final   Unit Type and Rh 12/03/2020 5100   Final   Blood Product Expiration Date 12/03/2020 I2897765   Final   Blood Product Unit Number 12/03/2020 [id number]   Final   PRODUCT CODE 12/03/2020 [id number]   Final   Unit Type and Rh 12/03/2020 5100   Final   Blood Product Expiration Date 12/03/2020 [id number]   Final     X-Rays:DG Chest 2 View  Result Date: 12/03/2020 CLINICAL DATA:  Preop for knee surgery at the end of the month. COPD/asthma. Recent diagnosis of prostate cancer. EXAM: CHEST - 2 VIEW COMPARISON:  06/25/2020 CT 03/18/2019 plain film. FINDINGS: Midline trachea. Mild cardiomegaly. Mediastinal contours otherwise within normal limits. No pleural effusion or pneumothorax. Subpleural and basilar predominant interstitial thickening is mild. No lobar consolidation. IMPRESSION: Mild cardiomegaly, without acute disease. Mild interstitial lung disease, grossly similar to the 10/18/2018 chest CT. Favor nonspecific interstitial pneumonia. Electronically  Signed   By: Abigail Miyamoto M.D.   On: 12/03/2020 16:35    EKG: Orders placed or performed in visit on 03/02/20   EKG 12-Lead     Hospital Course: [name] is a 75 y.o. who was admitted to [facility]. They were brought to the operating room on 12/15/2020 and underwent Procedure(s): LEFT TOTAL KNEE REVISION WITH REMOVAL OF HARDWARE.  Patient tolerated the procedure well and was later transferred to the recovery room and then to the orthopaedic floor for postoperative care. They were given PO and IV analgesics for pain control following their surgery. They were given 24 hours of postoperative antibiotics of  Anti-infectives (From admission, onward)    Start     Dose/Rate Route Frequency Ordered Stop   12/16/20 0000  ceFAZolin (ANCEF) IVPB 2g/100 mL premix        2 g 200 mL/hr over 30 Minutes Intravenous Every 6 hours 12/15/20 2325 12/16/20 0712   12/15/20 1400  ceFAZolin (ANCEF) IVPB 2g/100 mL premix        2 g 200 mL/hr over 30 Minutes Intravenous On call to O.R. 12/15/20 1337 12/15/20 1600   12/15/20 0600  vancomycin (VANCOREADY) IVPB 1500 mg/300 mL        1,500 mg 150 mL/hr over 120 Minutes Intravenous  Once 12/14/20 0732 12/15/20 1659      and started on DVT prophylaxis in the form of Xarelto.   PT and OT were ordered for total joint protocol. Discharge planning consulted to help with postop disposition and equipment needs. Patient had a good night on the evening of surgery. They started to get up OOB with therapy on POD #1. Continued to work with therapy into POD #2. His hemoglobin was 7.3, and he was given an additional 2 units PRBC for ABLA.  Pt was seen during rounds on day three and was ready to go to SNF pending progress with therapy. He decided on SNF placement as was recommended by PT. Pt worked with therapy for two additional sessions and was meeting their goals. He was discharged to SNF later that day in  stable condition.  Diet: Regular diet Activity:  PWB Follow-up: in 2 weeks Disposition: Skilled nursing facility Discharged Condition: good   Discharge Instructions     Call MD / Call 911   Complete by: As directed    If you experience chest pain or shortness of breath, CALL 911 and be transported to the hospital emergency room.  If you develope a fever above 101 F, pus (white drainage) or increased drainage or redness at the wound, or calf pain, call your surgeon's office.   Change dressing   Complete by: As directed    Maintain surgical dressing until follow up in the clinic. If the edges start to pull up, may reinforce with tape. If the dressing is no longer working, may remove and cover with gauze and tape, but must keep the area dry and clean.  Call with any questions or concerns.   Constipation Prevention   Complete by: As directed    Drink plenty of fluids.  Prune juice may be helpful.  You may use a stool softener, such as Colace (over the counter) 100 mg twice a day.  Use MiraLax (over the counter) for constipation as needed.   Diet - low sodium heart healthy   Complete by: As directed    Increase activity slowly as tolerated   Complete by: As directed    Partial weight bearing with assist device as directed.   Post-operative opioid taper instructions:   Complete by: As directed    POST-OPERATIVE OPIOID TAPER INSTRUCTIONS: It is important to wean off of your opioid medication as soon as possible. If you do not need pain medication after your surgery it is ok to stop day one. Opioids include: Codeine, Hydrocodone(Norco, Vicodin), Oxycodone(Percocet, oxycontin) and hydromorphone amongst others.  Long term and even short term use of opiods can cause: Increased pain response Dependence Constipation Depression Respiratory depression And more.  Withdrawal symptoms can include Flu like symptoms Nausea, vomiting And more Techniques to manage these symptoms Hydrate well Eat regular healthy meals Stay active Use relaxation  techniques(deep breathing, meditating, yoga) Do Not substitute Alcohol to help with tapering If you have been on opioids for less than two weeks and do not have pain than it is ok to stop all together.  Plan to wean off of opioids This plan should start within one week post op of your joint replacement. Maintain the same interval or time between taking each dose and first decrease the dose.  Cut the total daily intake of opioids by one tablet each day Next start to increase the time between doses. The last dose that should be eliminated is the evening dose.      TED hose   Complete by: As directed    Use stockings (TED hose) for 2 weeks on both leg(s).  You may remove them at night for sleeping.      Allergies as of 12/17/2020       Reactions   Lisinopril Cough        Medication List     STOP taking these medications    aspirin EC 81 MG tablet       TAKE these medications    acarbose 100 MG tablet Commonly known as: PRECOSE Take 100 mg by mouth 3 (three) times daily.   allopurinol 300 MG tablet Commonly known as: ZYLOPRIM Take 300 mg by mouth daily.   amLODipine 10 MG tablet Commonly known as: NORVASC Take 1 tablet (10 mg total) by mouth daily. What  changed: when to take this   ascorbic acid 1000 MG tablet Commonly known as: VITAMIN C Take 1 tablet (1,000 mg total) by mouth daily.   atorvastatin 80 MG tablet Commonly known as: LIPITOR TAKE 1 TABLET (80 MG TOTAL) BY MOUTH DAILY. (STOP SIMVASTATIN)   budesonide-formoterol 160-4.5 MCG/ACT inhaler Commonly known as: SYMBICORT Inhale 2 puffs into the lungs daily.   carvedilol 25 MG tablet Commonly known as: COREG Take 25 mg by mouth 2 (two) times daily with a meal.   docusate sodium 100 MG capsule Commonly known as: COLACE Take 1 capsule (100 mg total) by mouth 2 (two) times daily.   ferrous sulfate 325 (65 FE) MG tablet Take 1 tablet (325 mg total) by mouth 3 (three) times daily after meals for 14  days.   fluticasone 50 MCG/ACT nasal spray Commonly known as: FLONASE Place 1 spray into both nostrils in the morning and at bedtime.   gabapentin 300 MG capsule Commonly known as: NEURONTIN Take 600 mg by mouth at bedtime.   glipiZIDE 5 MG 24 hr tablet Commonly known as: GLUCOTROL XL Take 5 mg by mouth every morning.   insulin glargine 100 UNIT/ML injection Commonly known as: LANTUS Inject 35 Units into the skin at bedtime.   isosorbide mononitrate 30 MG 24 hr tablet Commonly known as: IMDUR TAKE 1 & 1/2 TABLETS ('45MG'$ ) BY MOUTH ONCE DAILY What changed:  how much to take how to take this when to take this additional instructions   levocetirizine 5 MG tablet Commonly known as: XYZAL Take 5 mg by mouth every evening.   metFORMIN 500 MG 24 hr tablet Commonly known as: GLUCOPHAGE-XR Take 500 mg by mouth 2 (two) times daily.   methocarbamol 500 MG tablet Commonly known as: ROBAXIN Take 1 tablet (500 mg total) by mouth every 6 (six) hours as needed for muscle spasms.   montelukast 10 MG tablet Commonly known as: SINGULAIR Take 10 mg by mouth at bedtime.   multivitamin with minerals Tabs tablet Take 1 tablet by mouth daily.   nitroGLYCERIN 0.4 MG SL tablet Commonly known as: Nitrostat Place 1 tablet (0.4 mg total) under the tongue every 5 (five) minutes as needed for chest pain.   ondansetron 4 MG disintegrating tablet Commonly known as: Zofran ODT Take 1 tablet (4 mg total) by mouth every 8 (eight) hours as needed for nausea or vomiting.   Oxycodone HCl 10 MG Tabs Take 1-2 tablets (10-20 mg total) by mouth every 4 (four) hours as needed for severe pain (pain score 7-10).   OXYGEN Inhale 2 L into the lungs 3 (three) times daily as needed (shortness of breath).   Ozempic (0.25 or 0.5 MG/DOSE) 2 MG/1.5ML Sopn Generic drug: Semaglutide(0.25 or 0.'5MG'$ /DOS) Inject 0.5 mg into the skin every Thursday.   pantoprazole 40 MG tablet Commonly known as: PROTONIX Take 40  mg by mouth daily.   polyethylene glycol 17 g packet Commonly known as: MIRALAX / GLYCOLAX Take 17 g by mouth daily as needed for mild constipation.   PRESCRIPTION MEDICATION Inhale into the lungs at bedtime. CPAP   rivaroxaban 10 MG Tabs tablet Commonly known as: XARELTO Take 1 tablet (10 mg total) by mouth daily with breakfast for 14 days. After completing this medication, please take aspirin 81 mg twice daily for another month to prevent blood clot. Start taking on: December 18, 2020   torsemide 20 MG tablet Commonly known as: DEMADEX Take 1 tablet (20 mg total) by mouth 2 (two) times daily.  valsartan 80 MG tablet Commonly known as: DIOVAN Take 80 mg by mouth daily.   zolpidem 10 MG tablet Commonly known as: AMBIEN Take 10 mg by mouth at bedtime as needed for sleep.               Discharge Care Instructions  (From admission, onward)           Start     Ordered   12/17/20 0000  Change dressing       Comments: Maintain surgical dressing until follow up in the clinic. If the edges start to pull up, may reinforce with tape. If the dressing is no longer working, may remove and cover with gauze and tape, but must keep the area dry and clean.  Call with any questions or concerns.   12/17/20 1541            Contact information for follow-up providers     Paralee Cancel, MD. Schedule an appointment as soon as possible for a visit in 2 week(s).   Specialty: Orthopedic Surgery Contact information: [address] [place] [address] [id number]              Contact information for after-discharge care     [facility] .   Service: Skilled Nursing Contact information: [address] [place] [phone]                     Added Tranexamic Acid to take for 5 days   Signed: Griffith Citron, PA-C Orthopedic Surgery 12/17/2020, 5:17 PM

## 2020-12-17 NOTE — Brief Op Note (Signed)
12/15/2020  10:12 AM  PATIENT:  [name]  75 y.o. 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  PROCEDURE:  Procedure(s): REMOVAL OF HARDWARE (Left) Left total knee revision  SURGEON:  Surgeon(s) and Role:    Paralee Cancel, MD - Primary  PHYSICIAN ASSISTANT: Costella Hatcher, PA-C  ANESTHESIA:   spinal  EBL:  1700 mL   BLOOD ADMINISTERED: 600 CC PRBC  DRAINS: none   LOCAL MEDICATIONS USED:  NONE  SPECIMEN:  No Specimen  DISPOSITION OF SPECIMEN:  N/A  COUNTS:  YES  TOURNIQUET:   Total Tourniquet Time Documented: Thigh (Left) - 25 minutes Thigh (Left) - 60 minutes Total: Thigh (Left) - 85 minutes   DICTATION: .Other Dictation: Dictation Number [id number]  PLAN OF CARE: Admit to inpatient   PATIENT DISPOSITION:  PACU - hemodynamically stable.   Delay start of Pharmacological VTE agent (>24hrs) due to surgical blood loss or risk of bleeding: no

## 2020-12-18 LAB — TYPE AND SCREEN
ABO/RH(D): O POS
Antibody Screen: NEGATIVE
Unit division: 0
Unit division: 0

## 2020-12-18 LAB — BPAM RBC
Blood Product Expiration Date: 202210022359
Blood Product Expiration Date: 202210022359
ISSUE DATE / TIME: 202209011117
ISSUE DATE / TIME: 202209011515
Unit Type and Rh: 5100
Unit Type and Rh: 5100

## 2020-12-18 LAB — SARS CORONAVIRUS 2 (TAT 6-24 HRS): SARS Coronavirus 2: NEGATIVE

## 2020-12-18 MED ORDER — TRANEXAMIC ACID 650 MG PO TABS
1950.0000 mg | ORAL_TABLET | Freq: Every day | ORAL | Status: DC
  Filled 2020-12-18: qty 3

## 2020-12-18 MED ORDER — TRANEXAMIC ACID 650 MG PO TABS: 1950.0000 mg | ORAL_TABLET | Freq: Every day | ORAL | 0 refills | Status: AC

## 2020-12-18 MED ORDER — COVID-19 MRNA VACC (MODERNA) 100 MCG/0.5ML IM SUSP
0.5000 mL | Freq: Once | INTRAMUSCULAR | Status: AC
  Administered 2020-12-18: 0.5 mL via INTRAMUSCULAR
  Filled 2020-12-18: qty 0.5

## 2020-12-18 NOTE — Plan of Care (Signed)
  Problem: Pain Management: Goal: Pain level will decrease with appropriate interventions Outcome: Progressing   

## 2020-12-18 NOTE — Progress Notes (Signed)
Patient stated that he wants to wear oxygen tonight. Does not want to wear his home CPAP

## 2020-12-18 NOTE — TOC Transition Note (Signed)
Transition of Care [facility]) - CM/SW Discharge Note  Patient Details  Name: LESTAT KAMPFER MRN: [id number] Date of Birth: 1945/10/04  Transition of Care [facility]) CM/SW Contact:  Sherie Don, LCSW Phone Number: 12/18/2020, 1:48 PM  Clinical Narrative: Patient is medically stable for discharge to [facility] (formerly [facility]). COVID test was negative. COVID booster given. Insurance authorization complete. Patient has been approved for 12/17/20-12/22/20. Plan Josem Kaufmann is: [id number]. Discharge summary, discharge orders, and SNF transfer report faxed to facility in hub.  Medical necessity form done; PTAR scheduled. Discharge packet complete. CSW notified wife of discharge. RN updated. The number for report is [phone] and the patient will go to room 203B. TOC signing off.  Final next level of care: Skilled Nursing Facility Barriers to Discharge: Barriers Resolved  Patient Goals and CMS Choice Patient states their goals for this hospitalization and ongoing recovery are:: Go to rehab CMS Medicare.gov Compare Post Acute Care list provided to:: Patient Choice offered to / list presented to : Patient  Discharge Placement Existing PASRR number confirmed : 12/16/20          Patient [facility] bed at: [facility] Patient to be transferred to facility by: [place] Name of family member notified: Shmiel Rigg Patient and family notified of of transfer: 12/18/20  Discharge Plan and Services In-house Referral: Clinical Social Work Post Acute Care Choice: [place]          DME Arranged: N/A DME Agency: NA  Readmission Risk Interventions No flowsheet data found.

## 2020-12-18 NOTE — Progress Notes (Signed)
Patient ID: [name], male   DOB: Apr 06, 1946, 75 y.o.   MRN: [id number] Subjective: 3 Days Post-Op Procedure(s) (LRB): LEFT TOTAL KNEE REVISION WITH REMOVAL OF HARDWARE (Left)    Patient reports pain as moderate but he has been up with therapy and walking the hall  Objective:   VITALS:   Vitals:   12/17/20 2154 12/18/20 0409  BP: (!) 143/67 (!) 147/74  Pulse: 91 87  Resp: 17 16  Temp: 97.8 F (36.6 C) 98.2 F (36.8 C)  SpO2: 94% 93%    Neurovascular intact Incision: dressing C/D/I - I removed his bulky dressing today I will have nursing replace the Mepilex dressing to the lateral thigh prior to discharge maintaining the Aquacel dressing anteriorly  LABS Recent Labs    12/16/20 0332 12/17/20 0342 12/17/20 2038  HGB 8.7* 7.3* 8.6*  HCT 25.1* 20.7* 24.5*  WBC 19.7* 14.5*  --   PLT 128* 114*  --     Recent Labs    12/16/20 0332  NA 138  K 4.5  BUN 17  CREATININE 1.09  GLUCOSE 364*    No results for input(s): LABPT, INR in the last 72 hours.   Assessment/Plan: 3 Days Post-Op Procedure(s) (LRB): LEFT TOTAL KNEE REVISION WITH REMOVAL OF HARDWARE (Left)   Up with therapy Discharge to SNF today most likely  Added TXA to take for 5 days given anticoagulation with Xarelto RTC in 2 weeks

## 2020-12-18 NOTE — Care Management Important Message (Signed)
Important Message  Patient Details IM Letter given to the Patient. Name: ONEY DUTSON MRN: [id number] Date of Birth: March 14, 1946   Medicare Important Message Given:  Yes     Kerin Salen 12/18/2020, 2:02 PM

## 2020-12-18 NOTE — Progress Notes (Signed)
Physical Therapy Treatment Patient Details Name: [name] MRN: [id number] DOB: 09/25/1945 Today's Date: 12/18/2020    History of Present Illness Pt is a 75 year old s/p LEFT TOTAL KNEE REVISION WITH REMOVAL OF HARDWARE on 12/15/20.  PMHx: COPD, CAD, HTN, gout, DMII, prostate cancer    PT Comments    Pt in recliner on arrival.  Pt states he is awaiting covid booster and then will d/c to SNF.  Pt assisted with ambulating in hallway and performed LE exercises.    Follow Up Recommendations  SNF     Equipment Recommendations  None recommended by PT    Recommendations for Other Services       Precautions / Restrictions Precautions Precautions: Fall;Knee Restrictions Weight Bearing Restrictions: Yes LLE Weight Bearing: Partial weight bearing LLE Partial Weight Bearing Percentage or Pounds: 50%    Mobility  Bed Mobility               General bed mobility comments: pt in recliner    Transfers Overall transfer level: Needs assistance Equipment used: Rolling walker (2 wheeled) Transfers: Sit to/from Stand Sit to Stand: Min assist         General transfer comment: assist to rise and steady, cues for UE and LE positioning, assist also to control descent  Ambulation/Gait Ambulation/Gait assistance: Min assist Gait Distance (Feet): 40 Feet Assistive device: Rolling walker (2 wheeled) Gait Pattern/deviations: Step-to pattern;Decreased stance time - left;Antalgic;Trunk flexed Gait velocity: decr   General Gait Details: verbal cues for sequence, RW positioning, posture; min assist for stability and weakness - pt reports more pain and stiffness in knee today   Stairs             Wheelchair Mobility    Modified Rankin (Stroke Patients Only)       Balance                                            Cognition Arousal/Alertness: Awake/alert Behavior During Therapy: WFL for tasks assessed/performed Overall Cognitive Status: Within  Functional Limits for tasks assessed                                        Exercises Total Joint Exercises Ankle Circles/Pumps: AROM;Both;10 reps Quad Sets: AROM;Left;10 reps Knee Flexion: AAROM;Seated;10 reps;Left    General Comments        Pertinent Vitals/Pain Pain Assessment: Faces Faces Pain Scale: Hurts even more Pain Location: left knee Pain Descriptors / Indicators: Sore;Aching Pain Intervention(s): Repositioned;Monitored during session;Ice applied    Home Living                      Prior Function            PT Goals (current goals can now be found in the care plan section) Progress towards PT goals: Progressing toward goals    Frequency    7X/week      PT Plan Current plan remains appropriate    Co-evaluation              AM-PAC PT "6 Clicks" Mobility   Outcome Measure  Help needed turning from your back to your side while in a flat bed without using bedrails?: A Little Help needed moving from lying on your back  to sitting on the side of a flat bed without using bedrails?: A Little Help needed moving to and from a bed to a chair (including a wheelchair)?: A Little Help needed standing up from a chair using your arms (e.g., wheelchair or bedside chair)?: A Little Help needed to walk in hospital room?: A Little Help needed climbing 3-5 steps with a railing? : A Lot 6 Click Score: 17    End of Session Equipment Utilized During Treatment: Gait belt Activity Tolerance: Patient tolerated treatment well Patient left: in chair;with call bell/phone within reach;with chair alarm set   PT Visit Diagnosis: Other abnormalities of gait and mobility (R26.89)     Time: [id number] PT Time Calculation (min) (ACUTE ONLY): 16 min  Charges:  $Gait Training: 8-22 mins                     Arlyce Dice, DPT Acute Rehabilitation Services Pager: [phone] Office: [phone]    York Ram E 12/18/2020, 12:50 PM

## 2020-12-23 NOTE — Progress Notes (Signed)
Left voice mail message with selita pt is at nursing home and please update [facility]  at [phone] if pt is having surgery on 12-28-2020.

## 2020-12-24 ENCOUNTER — Other Ambulatory Visit: Payer: Self-pay

## 2020-12-24 ENCOUNTER — Encounter (HOSPITAL_COMMUNITY): Payer: Medicare Other

## 2020-12-24 ENCOUNTER — Encounter (HOSPITAL_BASED_OUTPATIENT_CLINIC_OR_DEPARTMENT_OTHER): Payer: Self-pay | Admitting: Urology

## 2020-12-24 DIAGNOSIS — Z973 Presence of spectacles and contact lenses: Secondary | ICD-10-CM

## 2020-12-24 HISTORY — DX: Presence of spectacles and contact lenses: Z97.3

## 2020-12-24 NOTE — Progress Notes (Addendum)
Spoke w/ via phone for pre-op interview---pt wife patricia cell [phone] Lab needs dos----  cbc cmp pt ptt             Lab results------see below COVID test -----patient states asymptomatic no test needed Arrive at -------530 am  12-28-2020 NPO after MN NO Solid Food.  Clear liquids from MN until---430 am Med rec completed Medications to take morning of surgery -----atorvastatin, isosorbide mononitrate, zofran prn, oxycodone prn, allopurinol, symbicort, carvedilol, flonase, protonix Diabetic medication -----take 1/2 dose of hs lantus night before surgery (take 17.5 units), no diabetic medications day of surgery Patient aware to have Driver (ride ) / caregiver    for 24 hours after surgery  Patient Special Instructions -----fleets enema am of surgery Pre-Op special Istructions -----bring cpap mask tubing nad machine and leave in car Patient verbalized understanding of instructions that were given at this phone interview. Patient denies shortness of breath, chest pain, fever, cough at this phone interview.   Anesthesia : [facility] dr s Gifford Shave mda anesthesia note 12-04-2020  epic and note filed on 12-15-2020,, epic, see angela kabbe np 12-03-2020 note epic pt using walker for ambulation. Hx of cad, mi 2013, htn, dm type 2, osa with cpap use, pt wife patricia denies pt uses any oxygen with cpap at hs, pt has sob with heavy activity and cannot climb stairs at this time due to left total knee revision on 12-15-2020  Addendum; spoke with selita at dr dahlstedt  office and  pt wife patricia aware per Costella Hatcher pa for dr Paralee Cancel, pt can stop xarelto on 12-24-2020 and hold for as long as needed  PCP: dr Caffie Damme pradhan Cardiologist :lov dr Roderic Palau  10-16-2020 epic, dayna dunn pa clearance note 11-19-2020 epic for 12-15-2020 left totak knee revision surgery. Chest x-ray : chest xray 12-03-2020 epic, chest ct 06-28-2020 epic EKG :03-02-2020 epic Echo :01-25-2017  epic  Nuclear Stress test: 02-28-2020 epic Cardiac Cath : 05-29-2014 [facility] Activity level: walks around home with walker due to recent joint revision Sleep Study/ CPAP :uses cpap does not know settings Fasting Blood Sugar :  runs normal per wife patricia    / Checks Blood Sugar -- times a day:   Blood Thinner/ Instructions Maryjane Hurter Dose: xarelto 10 mg q day per dr Alvan Dame, left voice mail message with selita that pt needs instructions on xarelto from dr Alvan Dame (wife patricia aware dr Diona Fanti office to call with xarelto instructions)

## 2020-12-25 ENCOUNTER — Telehealth: Payer: Self-pay | Admitting: *Deleted

## 2020-12-25 NOTE — Telephone Encounter (Signed)
CALLED PATIENT TO REMIND OF PROCEDURE FOR 12-28-20, SPOKE WITH PATIENT AND HE IS AWARE OF THIS PROCEDURE

## 2020-12-27 ENCOUNTER — Encounter (HOSPITAL_BASED_OUTPATIENT_CLINIC_OR_DEPARTMENT_OTHER): Payer: Self-pay | Admitting: Urology

## 2020-12-27 NOTE — Anesthesia Preprocedure Evaluation (Addendum)
Anesthesia Evaluation  Patient identified by MRN, date of birth, ID band Patient awake    Reviewed: Allergy & Precautions, NPO status , Patient's Chart, lab work & pertinent test results  Airway Mallampati: I  TM Distance: >3 FB Neck ROM: Full    Dental  (+) Edentulous Upper, Edentulous Lower   Pulmonary asthma , sleep apnea and Continuous Positive Airway Pressure Ventilation , COPD,  COPD inhaler and oxygen dependent, former smoker,     + decreased breath sounds      Cardiovascular hypertension, Pt. on medications and Pt. on home beta blockers + CAD and + Cardiac Stents   Rhythm:Regular Rate:Normal  Echo (2018): - Left ventricle: The cavity size was mildly dilated. Wall  thickness was normal. Systolic function was normal. The estimated  ejection fraction was in the range of 55% to 60%. Wall motion was  normal; there were no regional wall motion abnormalities. Doppler  parameters are consistent with abnormal left ventricular  relaxation (grade 1 diastolic dysfunction). Doppler parameters  are consistent with high ventricular filling pressure.  - Mitral valve: There was mild regurgitation.    Neuro/Psych negative neurological ROS  negative psych ROS   GI/Hepatic Neg liver ROS, GERD  Medicated,  Endo/Other  diabetes, Type 2, Oral Hypoglycemic Agents, Insulin Dependent  Renal/GU negative Renal ROS     Musculoskeletal  (+) Arthritis ,   Abdominal Normal abdominal exam  (+)   Peds  Hematology   Anesthesia Other Findings   Reproductive/Obstetrics                            Anesthesia Physical Anesthesia Plan  ASA: 3  Anesthesia Plan: General   Post-op Pain Management:    Induction: Intravenous  PONV Risk Score and Plan: 3 and Ondansetron, Midazolam and Treatment may vary due to age or medical condition  Airway Management Planned: LMA  Additional Equipment: None  Intra-op  Plan:   Post-operative Plan: Extubation in OR  Informed Consent: I have reviewed the patients History and Physical, chart, labs and discussed the procedure including the risks, benefits and alternatives for the proposed anesthesia with the patient or authorized representative who has indicated his/her understanding and acceptance.     Dental advisory given  Plan Discussed with: CRNA  Anesthesia Plan Comments:       Anesthesia Quick Evaluation

## 2020-12-28 ENCOUNTER — Ambulatory Visit (HOSPITAL_BASED_OUTPATIENT_CLINIC_OR_DEPARTMENT_OTHER): Payer: Medicare Other | Admitting: Anesthesiology

## 2020-12-28 ENCOUNTER — Ambulatory Visit (HOSPITAL_BASED_OUTPATIENT_CLINIC_OR_DEPARTMENT_OTHER)
Admission: RE | Admit: 2020-12-28 | Discharge: 2020-12-28 | Disposition: A | Discharge status: Home / Self Care | Payer: Medicare Other | Source: Ambulatory Visit | Attending: Urology | Admitting: Urology

## 2020-12-28 ENCOUNTER — Ambulatory Visit (HOSPITAL_COMMUNITY): Payer: Medicare Other

## 2020-12-28 ENCOUNTER — Encounter (HOSPITAL_BASED_OUTPATIENT_CLINIC_OR_DEPARTMENT_OTHER): Payer: Self-pay | Admitting: Urology

## 2020-12-28 ENCOUNTER — Encounter (HOSPITAL_BASED_OUTPATIENT_CLINIC_OR_DEPARTMENT_OTHER)
Admission: RE | Disposition: A | Discharge status: Home / Self Care | Payer: Self-pay | Source: Ambulatory Visit | Attending: Urology

## 2020-12-28 DIAGNOSIS — Z96652 Presence of left artificial knee joint: Secondary | ICD-10-CM | POA: Insufficient documentation

## 2020-12-28 DIAGNOSIS — Z87891 Personal history of nicotine dependence: Secondary | ICD-10-CM | POA: Insufficient documentation

## 2020-12-28 DIAGNOSIS — Z955 Presence of coronary angioplasty implant and graft: Secondary | ICD-10-CM | POA: Diagnosis not present

## 2020-12-28 DIAGNOSIS — Z794 Long term (current) use of insulin: Secondary | ICD-10-CM | POA: Diagnosis not present

## 2020-12-28 DIAGNOSIS — Z888 Allergy status to other drugs, medicaments and biological substances status: Secondary | ICD-10-CM | POA: Diagnosis not present

## 2020-12-28 DIAGNOSIS — Z8616 Personal history of COVID-19: Secondary | ICD-10-CM | POA: Insufficient documentation

## 2020-12-28 DIAGNOSIS — C61 Malignant neoplasm of prostate: Secondary | ICD-10-CM | POA: Diagnosis present

## 2020-12-28 DIAGNOSIS — Z85828 Personal history of other malignant neoplasm of skin: Secondary | ICD-10-CM | POA: Insufficient documentation

## 2020-12-28 HISTORY — DX: Polyneuropathy, unspecified: G62.9

## 2020-12-28 HISTORY — DX: Dyspnea, unspecified: R06.00

## 2020-12-28 HISTORY — PX: SPACE OAR INSTILLATION: SHX6769

## 2020-12-28 HISTORY — PX: RADIOACTIVE SEED IMPLANT: SHX5150

## 2020-12-28 HISTORY — DX: Personal history of other medical treatment: Z92.89

## 2020-12-28 LAB — CBC
HCT: 29.6 % — ABNORMAL LOW (ref 39.0–52.0)
Hemoglobin: 9.7 g/dL — ABNORMAL LOW (ref 13.0–17.0)
MCH: 31.8 pg (ref 26.0–34.0)
MCHC: 32.8 g/dL (ref 30.0–36.0)
MCV: 97 fL (ref 80.0–100.0)
Platelets: 194 10*3/uL (ref 150–400)
RBC: 3.05 MIL/uL — ABNORMAL LOW (ref 4.22–5.81)
RDW: 17.2 % — ABNORMAL HIGH (ref 11.5–15.5)
WBC: 5.5 10*3/uL (ref 4.0–10.5)
nRBC: 0 % (ref 0.0–0.2)

## 2020-12-28 LAB — APTT: aPTT: 26 seconds (ref 24–36)

## 2020-12-28 LAB — PROTIME-INR
INR: 1 (ref 0.8–1.2)
Prothrombin Time: 13.1 seconds (ref 11.4–15.2)

## 2020-12-28 LAB — COMPREHENSIVE METABOLIC PANEL
ALT: 553 U/L — ABNORMAL HIGH (ref 0–44)
AST: 1035 U/L — ABNORMAL HIGH (ref 15–41)
Albumin: 3.2 g/dL — ABNORMAL LOW (ref 3.5–5.0)
Alkaline Phosphatase: 388 U/L — ABNORMAL HIGH (ref 38–126)
Anion gap: 13 (ref 5–15)
BUN: 13 mg/dL (ref 8–23)
CO2: 23 mmol/L (ref 22–32)
Calcium: 8.6 mg/dL — ABNORMAL LOW (ref 8.9–10.3)
Chloride: 100 mmol/L (ref 98–111)
Creatinine, Ser: 1.19 mg/dL (ref 0.61–1.24)
GFR, Estimated: >60 mL/min (ref >60)
Glucose, Bld: 184 mg/dL — ABNORMAL HIGH (ref 70–99)
Potassium: 3.6 mmol/L (ref 3.5–5.1)
Sodium: 136 mmol/L (ref 135–145)
Total Bilirubin: 4 mg/dL — ABNORMAL HIGH (ref 0.3–1.2)
Total Protein: 6.7 g/dL (ref 6.5–8.1)

## 2020-12-28 LAB — GLUCOSE, CAPILLARY: Glucose-Capillary: 189 mg/dL — ABNORMAL HIGH (ref 70–99)

## 2020-12-28 SURGERY — INSERTION, RADIATION SOURCE, PROSTATE
Anesthesia: General

## 2020-12-28 MED ORDER — EPHEDRINE 5 MG/ML INJ
INTRAVENOUS | Status: AC
  Filled 2020-12-28: qty 5

## 2020-12-28 MED ORDER — FLEET ENEMA 7-19 GM/118ML RE ENEM: 1.0000 | ENEMA | Freq: Once | RECTAL | Status: DC

## 2020-12-28 MED ORDER — ONDANSETRON HCL 4 MG/2ML IJ SOLN
INTRAMUSCULAR | Status: DC | PRN
  Administered 2020-12-28: 4 mg via INTRAVENOUS

## 2020-12-28 MED ORDER — FENTANYL CITRATE (PF) 100 MCG/2ML IJ SOLN
INTRAMUSCULAR | Status: AC
  Filled 2020-12-28: qty 2

## 2020-12-28 MED ORDER — PROPOFOL 10 MG/ML IV BOLUS
INTRAVENOUS | Status: AC
  Filled 2020-12-28: qty 20

## 2020-12-28 MED ORDER — IOHEXOL 300 MG/ML  SOLN
INTRAMUSCULAR | Status: DC | PRN
  Administered 2020-12-28: 7 mL

## 2020-12-28 MED ORDER — FENTANYL CITRATE (PF) 100 MCG/2ML IJ SOLN
25.0000 ug | INTRAMUSCULAR | Status: DC | PRN
  Administered 2020-12-28 (×2): 25 ug via INTRAVENOUS

## 2020-12-28 MED ORDER — PROMETHAZINE HCL 25 MG/ML IJ SOLN: 6.2500 mg | INTRAMUSCULAR | Status: DC | PRN

## 2020-12-28 MED ORDER — PHENYLEPHRINE 40 MCG/ML (10ML) SYRINGE FOR IV PUSH (FOR BLOOD PRESSURE SUPPORT)
PREFILLED_SYRINGE | INTRAVENOUS | Status: AC
  Filled 2020-12-28: qty 10

## 2020-12-28 MED ORDER — CEFAZOLIN SODIUM-DEXTROSE 2-4 GM/100ML-% IV SOLN
INTRAVENOUS | Status: AC
  Filled 2020-12-28: qty 100

## 2020-12-28 MED ORDER — OXYCODONE HCL 5 MG PO TABS
ORAL_TABLET | ORAL | Status: AC
  Filled 2020-12-28: qty 1

## 2020-12-28 MED ORDER — LACTATED RINGERS IV SOLN: INTRAVENOUS | Status: DC

## 2020-12-28 MED ORDER — PROPOFOL 10 MG/ML IV BOLUS
INTRAVENOUS | Status: DC | PRN
  Administered 2020-12-28: 50 mg via INTRAVENOUS
  Administered 2020-12-28: 30 mg via INTRAVENOUS

## 2020-12-28 MED ORDER — CEFAZOLIN SODIUM-DEXTROSE 2-4 GM/100ML-% IV SOLN
2.0000 g | Freq: Once | INTRAVENOUS | Status: AC
  Administered 2020-12-28: 2 g via INTRAVENOUS

## 2020-12-28 MED ORDER — PROPOFOL 10 MG/ML IV BOLUS
INTRAVENOUS | Status: AC
  Filled 2020-12-28: qty 40

## 2020-12-28 MED ORDER — SODIUM CHLORIDE (PF) 0.9 % IJ SOLN
INTRAMUSCULAR | Status: DC | PRN
  Administered 2020-12-28: 10 mL

## 2020-12-28 MED ORDER — OXYCODONE HCL 5 MG/5ML PO SOLN: 5.0000 mg | Freq: Once | ORAL | Status: AC | PRN

## 2020-12-28 MED ORDER — ACETAMINOPHEN 10 MG/ML IV SOLN
1000.0000 mg | Freq: Once | INTRAVENOUS | Status: DC | PRN
  Administered 2020-12-28: 1000 mg via INTRAVENOUS

## 2020-12-28 MED ORDER — PHENYLEPHRINE HCL (PRESSORS) 10 MG/ML IV SOLN
INTRAVENOUS | Status: DC | PRN
  Administered 2020-12-28: 80 ug via INTRAVENOUS

## 2020-12-28 MED ORDER — FENTANYL CITRATE (PF) 100 MCG/2ML IJ SOLN
INTRAMUSCULAR | Status: DC | PRN
  Administered 2020-12-28: 25 ug via INTRAVENOUS
  Administered 2020-12-28: 50 ug via INTRAVENOUS
  Administered 2020-12-28: 25 ug via INTRAVENOUS

## 2020-12-28 MED ORDER — AMISULPRIDE (ANTIEMETIC) 5 MG/2ML IV SOLN: 10.0000 mg | Freq: Once | INTRAVENOUS | Status: DC | PRN

## 2020-12-28 MED ORDER — OXYCODONE HCL 5 MG PO TABS
5.0000 mg | ORAL_TABLET | Freq: Once | ORAL | Status: AC | PRN
  Administered 2020-12-28: 5 mg via ORAL

## 2020-12-28 MED ORDER — MIDAZOLAM HCL 2 MG/2ML IJ SOLN
INTRAMUSCULAR | Status: DC | PRN
  Administered 2020-12-28: .5 mg via INTRAVENOUS

## 2020-12-28 MED ORDER — STERILE WATER FOR IRRIGATION IR SOLN
Status: DC | PRN
  Administered 2020-12-28: 500 mL

## 2020-12-28 MED ORDER — ACETAMINOPHEN 10 MG/ML IV SOLN
INTRAVENOUS | Status: AC
  Filled 2020-12-28: qty 100

## 2020-12-28 MED ORDER — LIDOCAINE HCL (CARDIAC) PF 100 MG/5ML IV SOSY
PREFILLED_SYRINGE | INTRAVENOUS | Status: DC | PRN
  Administered 2020-12-28: 40 mg via INTRAVENOUS

## 2020-12-28 MED ORDER — MIDAZOLAM HCL 2 MG/2ML IJ SOLN
INTRAMUSCULAR | Status: AC
  Filled 2020-12-28: qty 2

## 2020-12-28 MED ORDER — ACETAMINOPHEN 325 MG PO TABS: 325.0000 mg | ORAL_TABLET | ORAL | Status: DC | PRN

## 2020-12-28 MED ORDER — SODIUM CHLORIDE 0.9 % IV SOLN
INTRAVENOUS | Status: AC | PRN
  Administered 2020-12-28: 1000 mL

## 2020-12-28 MED ORDER — ACETAMINOPHEN 160 MG/5ML PO SOLN: 325.0000 mg | ORAL | Status: DC | PRN

## 2020-12-28 SURGICAL SUPPLY — 36 items
BAG DRN RND TRDRP ANRFLXCHMBR (UROLOGICAL SUPPLIES) ×1
BAG URINE DRAIN 2000ML AR STRL (UROLOGICAL SUPPLIES) ×2 IMPLANT
BLADE CLIPPER SENSICLIP SURGIC (BLADE) ×2 IMPLANT
CATH FOLEY 2WAY SLVR  5CC 16FR (CATHETERS) ×2
CATH FOLEY 2WAY SLVR 5CC 16FR (CATHETERS) ×1 IMPLANT
CATH ROBINSON RED A/P 16FR (CATHETERS) IMPLANT
CATH ROBINSON RED A/P 20FR (CATHETERS) ×2 IMPLANT
CLOTH BEACON ORANGE TIMEOUT ST (SAFETY) ×2 IMPLANT
CNTNR URN SCR LID CUP LEK RST (MISCELLANEOUS) ×2 IMPLANT
CONT SPEC 4OZ STRL OR WHT (MISCELLANEOUS) ×4
COVER BACK TABLE 60X90IN (DRAPES) ×2 IMPLANT
COVER MAYO STAND STRL (DRAPES) ×2 IMPLANT
DRAPE C-ARM 35X43 STRL (DRAPES) ×2 IMPLANT
DRSG TEGADERM 4X4.75 (GAUZE/BANDAGES/DRESSINGS) ×3 IMPLANT
DRSG TEGADERM 8X12 (GAUZE/BANDAGES/DRESSINGS) ×4 IMPLANT
GAUZE SPONGE 4X4 3PLY NS LF (GAUZE/BANDAGES/DRESSINGS) ×1 IMPLANT
GLOVE SURG ENC MOIS LTX SZ6.5 (GLOVE) ×2 IMPLANT
GLOVE SURG ENC MOIS LTX SZ7.5 (GLOVE) IMPLANT
GLOVE SURG ENC MOIS LTX SZ8 (GLOVE) ×4 IMPLANT
GLOVE SURG ORTHO LTX SZ8.5 (GLOVE) ×2 IMPLANT
GLOVE SURG UNDER POLY LF SZ6.5 (GLOVE) IMPLANT
GOWN STRL REUS W/TWL XL LVL3 (GOWN DISPOSABLE) ×2 IMPLANT
HOLDER FOLEY CATH W/STRAP (MISCELLANEOUS) ×2 IMPLANT
I-SEEDAGX100 ×1 IMPLANT
IMPL SPACEOAR VUE SYSTEM (Spacer) ×1 IMPLANT
IMPLANT SPACEOAR VUE SYSTEM (Spacer) ×2 IMPLANT
IV NS 1000ML (IV SOLUTION) ×2
IV NS 1000ML BAXH (IV SOLUTION) ×1 IMPLANT
KIT TURNOVER CYSTO (KITS) ×2 IMPLANT
MARKER SKIN DUAL TIP RULER LAB (MISCELLANEOUS) ×2 IMPLANT
PACK CYSTO (CUSTOM PROCEDURE TRAY) ×2 IMPLANT
SUT BONE WAX W31G (SUTURE) IMPLANT
SYR 10ML LL (SYRINGE) ×2 IMPLANT
TOWEL OR 17X26 10 PK STRL BLUE (TOWEL DISPOSABLE) ×2 IMPLANT
UNDERPAD 30X36 HEAVY ABSORB (UNDERPADS AND DIAPERS) ×4 IMPLANT
WATER STERILE IRR 500ML POUR (IV SOLUTION) ×2 IMPLANT

## 2020-12-28 NOTE — H&P (Signed)
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 He has been started on LT ADT.  Past Medical History:  Diagnosis Date   Arthritis    Asthma    Cancer [facility])    Prostate   COPD (chronic obstructive pulmonary disease) ([place])    Coronary atherosclerosis of native coronary artery 04/22/2001      Cardiac Catheterization   COVID 10/2020   asymptomatic home test due to covid exposure   Diverticulitis    Dyspnea    WITH EXERTION   Encounter for long-term (current) use of insulin (HCC)    Esophageal reflux    Gout, unspecified    History of blood transfusion    1 unit after 12-15-2020 surgery no problems with   History of kidney stones    Neuropathy    toes all the time   Old myocardial infarction 2013   Personal history of tobacco use, presenting hazards to health    Postsurgical percutaneous transluminal coronary angioplasty status    Prostate cancer ([place])    Pure hypercholesterolemia    Skin cancer    Sleep apnea    USES CPAP   Type II or unspecified type diabetes mellitus without mention of complication, not stated as uncontrolled    TYPE 11   Unspecified essential hypertension    Wears glasses 12/24/2020    Past Surgical History:  Procedure Laterality Date   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 Left dominent   CARDIAC CATHETERIZATION  01/26/2011   LAD: Patent proximal stent. 90% calcified eccentic stenosis in med segment, mild LCX disease   CAROTID STENTS     CORONARY ANGIOPLASTY WITH STENT PLACEMENT  2003   Prox LAD: 3.0 X12 BMS   CORONARY ANGIOPLASTY WITH STENT PLACEMENT  01/26/2011   Mid LAD: 3.0 X15 mm Vision BMS. Complicated by a jailed septal branch and periprocedural MI   EYE SURGERY Bilateral    laser for bleed   HEMORRHOID SURGERY     yrs ago per pt on 12-24-2020    HERNIA REPAIR     INGUINAL YRS AGO per pt on 12-24-2020   HUMERUS FRACTURE SURGERY Left    LEFT TOTAK KNEE REPLACEMENT  2015   [facility]   NASAL SINUS SURGERY     yrs ago per pt on 12-24-2020   ORIF FEMUR FRACTURE Left 03/19/2019   Procedure: OPEN REDUCTION INTERNAL FIXATION (ORIF) DISTAL FEMUR FRACTURE;  Surgeon: Altamese Crossgate, MD;  Location: [place];  Service: Orthopedics;  Laterality: Left;   ORIF FEMUR FRACTURE Left 08/06/2019   Procedure: REPAIR DISTAL FEMUR NONUNION WITH INFUSE AND ALLOGRAFT;  Surgeon: Altamese Dansville, MD;  Location: [place];  Service: Orthopedics;  Laterality: Left;   TOTAL KNEE REVISION Left 12/15/2020   Procedure: LEFT TOTAL KNEE REVISION WITH REMOVAL OF HARDWARE;  Surgeon: Paralee Cancel, MD;  Location: [facility];  Service: Orthopedics;  Laterality: Left;    Home Medications:    Allergies:  Allergies  Allergen Reactions   Lisinopril Cough    Family History  Problem Relation Age of Onset   Coronary artery disease Other        Family History   Breast cancer Neg Hx    Colon cancer Neg Hx    Prostate cancer Neg Hx  Pancreatic cancer Neg Hx     Social History:  reports that he quit smoking about 35 years ago. His smoking use included cigarettes. He started smoking about 61 years ago. He has a 39.00 pack-year smoking history. He quit smokeless tobacco use about 35 years ago.  His smokeless tobacco use included snuff and chew. He reports that he does not currently use alcohol. He reports that he does not use drugs.  ROS: A complete review of systems was performed.  All systems are negative except for pertinent findings as noted.  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

## 2020-12-28 NOTE — Anesthesia Procedure Notes (Signed)
Procedure Name: LMA Insertion Date/Time: 12/28/2020 7:55 AM Performed by: Georgeanne Nim, CRNA Pre-anesthesia Checklist: Patient identified, Emergency Drugs available, Suction available, Patient being monitored and Timeout performed Patient Re-evaluated:Patient Re-evaluated prior to induction Oxygen Delivery Method: Circle system utilized Preoxygenation: Pre-oxygenation with 100% oxygen Induction Type: IV induction LMA: LMA inserted LMA Size: 5.0 Number of attempts: 1 Placement Confirmation: positive ETCO2 and CO2 detector Tube secured with: Tape Dental Injury: Teeth and Oropharynx as per pre-operative assessment

## 2020-12-28 NOTE — Transfer of Care (Signed)
Immediate Anesthesia Transfer of Care Note  Patient: [name]  Procedure(s) Performed: RADIOACTIVE SEED IMPLANT/BRACHYTHERAPY IMPLANT SPACE OAR INSTILLATION  Patient Location: PACU  Anesthesia Type:General  Level of Consciousness: awake and patient cooperative  Airway & Oxygen Therapy: Patient Spontanous Breathing and Patient connected to nasal cannula oxygen  Post-op Assessment: Report given to RN and Post -op Vital signs reviewed and stable  Post vital signs: Reviewed and stable  Last Vitals:  Vitals Value Taken Time  BP 164/68 12/28/20 0911  Temp    Pulse 64 12/28/20 0913  Resp 12 12/28/20 0913  SpO2 98 % 12/28/20 0913  Vitals shown include unvalidated device data.  Last Pain:  Vitals:   12/28/20 0658  TempSrc: Oral  PainSc: 8       Patients Stated Pain Goal: 5 ([id number])  Complications: No notable events documented.

## 2020-12-28 NOTE — Op Note (Signed)
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 Lucely Leard M.D.  Radiation Oncologist: Tyler Pita, M.D.  Anesthesia: Gen.   Indications: Patient  was diagnosed with clinical stage TI C prostate cancer. We had extensive discussion with him about treatment options versus. He elected to proceed with combination radiotherapy. He underwent consultation my office as well as with Dr. Tammi Klippel. He appeared to understand the advantages disadvantages potential risks of this treatment option. Full informed consent has been obtained.   Technique and findings: Patient was brought the operating room where he had successful induction of general anesthesia. He was placed in dorso-lithotomy position and prepped and draped in usual manner. Appropriate surgical timeout was performed. Radiation oncology department placed a transrectal ultrasound probe anchoring stand. Foley catheter with contrast in the balloon was inserted without difficulty. Anchoring needles were placed within the prostate. Rectal tube was placed. Real-time contouring of the urethra prostate and rectum were performed and the dosing parameters were established. Targeted dose was 110 gray.  I was then called  to the operating suite suite for placement of the needles. A second timeout was performed. All needle passage was done with real-time transrectal ultrasound guidance with the sagittal plane. A total of 19 needles were placed.  61 active seeds were implanted.  I then proceeded with placement of SpaceOAR by introducing a needle with the bevel angled inferiorly approximately 2 cm superior to the anus. This was angled downward and under direct ultrasound was placed within the space between the prostatic capsule and rectum. This was confirmed with a small amount of sterile saline  injected and this was performed under direct ultrasound. I then attached the SpaceOAR to the needle and injected this in the space between the prostate and rectum with good placement noted. The Foley catheter was removed and flexible cystoscopy failed to show any seeds outside the prostate.  The patient was brought to recovery room in stable condition, having tolerated the procedure well.Marland Kitchen

## 2020-12-28 NOTE — Discharge Instructions (Addendum)
Radioactive Seed Implant Home Care Instructions ° ° °Activity:    Rest for the remainder of the day.  Do not drive or operate equipment today.  You may resume normal  activities in a few days as instructed by your physician, without risk of harmful radiation exposure to those around you, provided you follow the time and distance precautions on the Radiation Oncology Instruction Sheet. ° ° °Meals: Drink plenty of lipuids and eat light foods, such as gelatin or soup this evening .  You may return to normal meal plan tomorrow. ° °Return °To Work: You may return to work as instructed by your physician. ° °Special °Instruction:   If any seeds are found, use tweezers to pick up seeds and place in a glass container of any kind and bring to your physician's office. ° °Call your physician if any of these symptoms occur: ° °· Persistent or heavy bleeding °· Urine stream diminishes or stops completely after catheter is removed °· Fever equal to or greater than 101 degrees F °· Cloudy urine with a strong foul odor °· Severe pain ° °You may feel some burning pain and/or hesitancy when you urinate after the catheter is removed.  These symptoms may increase over the next few weeks, but should diminish within forur to six weeks.  Applying moist heat to the lower abdomen or a hot tub bath may help relieve the pain.  If the discomfort becomes severe, please call your physician for additional medications. ° ° °Post Anesthesia Home Care Instructions ° °Activity: °Get plenty of rest for the remainder of the day. A responsible individual must stay with you for 24 hours following the procedure.  °For the next 24 hours, DO NOT: °-Drive a car °-Operate machinery °-Drink alcoholic beverages °-Take any medication unless instructed by your physician °-Make any legal decisions or sign important papers. ° °Meals: °Start with liquid foods such as gelatin or soup. Progress to regular foods as tolerated. Avoid greasy, spicy, heavy foods. If nausea  and/or vomiting occur, drink only clear liquids until the nausea and/or vomiting subsides. Call your physician if vomiting continues. ° °Special Instructions/Symptoms: °Your throat may feel dry or sore from the anesthesia or the breathing tube placed in your throat during surgery. If this causes discomfort, gargle with warm salt water. The discomfort should disappear within 24 hours. ° °   ° ° ° ° ° °

## 2020-12-28 NOTE — Progress Notes (Signed)
  Radiation Oncology         [phone]) [phone] ________________________________  Name: [name] MRN: [id number]  Date: 12/28/2020  DOB: 11-Nov-1945       Prostate Seed Implant  [id number], Tilden Fossa, MD  No ref. provider found  DIAGNOSIS:  Oncology History  Malignant neoplasm of prostate ([place])  07/24/2020 Cancer Staging   Staging form: Prostate, AJCC 8th Edition - Clinical stage from 07/24/2020: Stage IIIC (cT2c, cN0, cM0, PSA: 5.5, Grade Group: 5) - Signed by Freeman Caldron, PA-C on 08/31/2020 Histopathologic type: Adenocarcinoma, NOS Stage prefix: Initial diagnosis Prostate specific antigen (PSA) range: Less than 10 Gleason primary pattern: 4 Gleason secondary pattern: 5 Gleason score: 9 Histologic grading system: 5 grade system Number of biopsy cores examined: 12 Number of biopsy cores positive: 7 Location of positive needle core biopsies: Both sides   08/28/2020 Initial Diagnosis   Prostate cancer ([place])     No diagnosis found.  PROCEDURE: Insertion of radioactive I-125 seeds into the prostate gland.  RADIATION DOSE: 110 Gy, boost therapy.  TECHNIQUE: [name] was brought to the operating room with the urologist. He was placed in the dorsolithotomy position. He was catheterized and a rectal tube was inserted. The perineum was shaved, prepped and draped. The ultrasound probe was then introduced into the rectum to see the prostate gland.  TREATMENT DEVICE: A needle grid was attached to the ultrasound probe stand and anchor needles were placed.  3D PLANNING: The prostate was imaged in 3D using a sagittal sweep of the prostate probe. These images were transferred to the planning computer. There, the prostate, urethra and rectum were defined on each axial reconstructed image. Then, the software created an optimized 3D plan and a few seed positions were adjusted. The quality of the plan was reviewed using [facility] information for the target and the following two organs at risk:   Urethra and Rectum.  Then the accepted plan was printed and handed off to the radiation therapist.  Under my supervision, the custom loading of the seeds and spacers was carried out and loaded into sealed vicryl sleeves.  These pre-loaded needles were then placed into the needle holder.Marland Kitchen  PROSTATE VOLUME STUDY:  Using transrectal ultrasound the volume of the prostate was verified to be 33.8 cc.  SPECIAL TREATMENT PROCEDURE/SUPERVISION AND HANDLING: The pre-loaded needles were then delivered under sagittal guidance. A total of 19 needles were used to deposit 61 seeds in the prostate gland. The individual seed activity was 0.337 mCi.  SpaceOAR:  Yes  COMPLEX SIMULATION: At the end of the procedure, an anterior radiograph of the pelvis was obtained to document seed positioning and count. Cystoscopy was performed to check the urethra and bladder.  MICRODOSIMETRY: At the end of the procedure, the patient was emitting 0.054 mR/hr at 1 meter. Accordingly, he was considered safe for hospital discharge.  PLAN: The patient will return to the radiation oncology clinic for post implant CT dosimetry in three weeks.   ________________________________  Sheral Apley Tammi Klippel, M.D.

## 2020-12-28 NOTE — Anesthesia Postprocedure Evaluation (Signed)
Anesthesia Post Note  Patient: [name]  Procedure(s) Performed: RADIOACTIVE SEED IMPLANT/BRACHYTHERAPY IMPLANT SPACE OAR INSTILLATION     Patient location during evaluation: PACU Anesthesia Type: General Level of consciousness: awake and alert Pain management: pain level controlled Vital Signs Assessment: post-procedure vital signs reviewed and stable Respiratory status: spontaneous breathing, nonlabored ventilation, respiratory function stable and patient connected to nasal cannula oxygen Cardiovascular status: blood pressure returned to baseline and stable Postop Assessment: no apparent nausea or vomiting Anesthetic complications: no   No notable events documented.  Last Vitals:  Vitals:   12/28/20 1000 12/28/20 1030  BP: (!) 164/76 (!) 158/65  Pulse: 63 64  Resp: 14 16  Temp:  (!) 36.4 C  SpO2: 95% 98%       [name]

## 2020-12-28 NOTE — Interval H&P Note (Signed)
History and Physical Interval Note:  12/28/2020 7:32 AM  [name]  has presented today for surgery, with the diagnosis of PROSTATE CANCER.  The various methods of treatment have been discussed with the patient and family. After consideration of risks, benefits and other options for treatment, the patient has consented to  Procedure(s): RADIOACTIVE SEED IMPLANT/BRACHYTHERAPY IMPLANT (N/A) SPACE OAR INSTILLATION (N/A) as a surgical intervention.  The patient's history has been reviewed, patient examined, no change in status, stable for surgery.  I have reviewed the patient's chart and labs.  Questions were answered to the patient's satisfaction.     [name] [name]

## 2020-12-29 ENCOUNTER — Encounter (HOSPITAL_BASED_OUTPATIENT_CLINIC_OR_DEPARTMENT_OTHER): Payer: Self-pay | Admitting: Urology

## 2020-12-30 ENCOUNTER — Other Ambulatory Visit (HOSPITAL_COMMUNITY): Payer: Self-pay

## 2020-12-30 ENCOUNTER — Telehealth: Payer: Self-pay | Admitting: *Deleted

## 2020-12-30 NOTE — Telephone Encounter (Signed)
PATIENT HAS AN APPT. WITH [facility] FOR [place] VISIT ON 12-31-20, PATIENT IS AWARE OF THIS APPT. PER [facility] [employer]

## 2021-01-27 ENCOUNTER — Telehealth: Payer: Self-pay | Admitting: *Deleted

## 2021-01-27 NOTE — Telephone Encounter (Signed)
CALLED PATIENT TO REMIND OF POST SEED APPTS. FOR 01-29-21, LVM FOR A RETURN CALL

## 2021-01-28 NOTE — Progress Notes (Signed)
  Radiation Oncology         [phone]) [phone] ________________________________  Name: [name] MRN: [id number]  Date: 01/29/2021  DOB: 06-Jun-1945  COMPLEX SIMULATION NOTE  NARRATIVE:  The patient was brought to the [facility] today following prostate seed boost implantation approximately one month ago.  Identity was confirmed.  All relevant records and images related to the planned course of therapy were reviewed.  Then, the patient was set-up supine.  CT images were obtained.  The CT images were loaded into the planning software.  Then the prostate and rectum were contoured.  Treatment planning then occurred.  The implanted iodine 125 seeds were identified by the physics staff for projection of radiation distribution  I have requested : 3D Simulation  I have requested a DVH of the following structures: Prostate and rectum.    He will receive 45 Gy in 25 fractions of 1.8 Gy in [place], to supplement an up-front prostate seed implant boost of 110 Gy to achieve a total nominal dose of 155 Gy.  ________________________________  Sheral Apley. Tammi Klippel, M.D.

## 2021-01-29 ENCOUNTER — Ambulatory Visit
Admission: RE | Admit: 2021-01-29 | Discharge: 2021-01-29 | Disposition: A | Discharge status: Home / Self Care | Payer: Medicare Other | Source: Ambulatory Visit | Attending: Radiation Oncology | Admitting: Radiation Oncology

## 2021-01-29 ENCOUNTER — Ambulatory Visit: Payer: Medicare Other | Admitting: Urology

## 2021-01-29 ENCOUNTER — Ambulatory Visit: Payer: Medicare Other | Admitting: Radiation Oncology

## 2021-01-29 ENCOUNTER — Encounter: Payer: Self-pay | Admitting: Urology

## 2021-01-29 ENCOUNTER — Ambulatory Visit
Admission: RE | Admit: 2021-01-29 | Discharge: 2021-01-29 | Disposition: A | Discharge status: Home / Self Care | Payer: Medicare Other | Source: Ambulatory Visit | Attending: Urology | Admitting: Urology

## 2021-01-29 ENCOUNTER — Other Ambulatory Visit: Payer: Self-pay

## 2021-01-29 VITALS — Ht 70.0 in | Wt 243.6 lb

## 2021-01-29 VITALS — BP 137/58 | HR 65 | Temp 97.9°F | Resp 20 | Ht 70.0 in | Wt 243.6 lb

## 2021-01-29 DIAGNOSIS — C61 Malignant neoplasm of prostate: Secondary | ICD-10-CM | POA: Insufficient documentation

## 2021-01-29 NOTE — Progress Notes (Signed)
Radiation Oncology         [phone] ________________________________  Name: [name] MRN: [id number]  Date: 01/29/2021  DOB: 09-22-1945  Post-Seed Follow-Up Visit Note  CC: Tobe Sos, MD  Franchot Gallo, MD  Diagnosis:   75 y.o. 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 Malignant neoplasm of prostate ([place])  C61       Interval Since Last Radiation:  4.5 weeks (concurrent with ADT-Eligard 13-monthinjection on 10/23/20) 12/28/20:  Insertion of radioactive I-125 seeds into the prostate gland; 110 Gy, boost therapy with placement of SpaceOAR gel.  Narrative:  The patient returns today for routine follow-up.  He is complaining of increased urinary frequency and urinary hesitation symptoms. He filled out a questionnaire regarding urinary function today providing and overall IPSS score of 18 characterizing his symptoms as moderate-severe with nocturia x4, weak stream and intermittency.  He reports that the LUTS are gradually improving and tolerable at this point. His pre-implant score was 4. He denies any abdominal pain or bowel symptoms.  He reports a healthy appetite and is maintaining his weight.  He denies any excessive fatigue or tiredness and continues to tolerate the ADT fairly well.  ALLERGIES:  is allergic to lisinopril.  Meds: Current Outpatient Medications  Medication Sig Dispense Refill   acarbose (PRECOSE) 100 MG tablet Take 100 mg by mouth 3 (three) times daily.     allopurinol (ZYLOPRIM) 300 MG tablet Take 300 mg by mouth daily.      amLODipine (NORVASC) 10 MG tablet Take 1 tablet (10 mg total) by mouth daily. (Patient taking differently: Take 10 mg by mouth at bedtime.) 30 tablet 6   atorvastatin (LIPITOR) 80 MG tablet TAKE 1 TABLET (80 MG TOTAL) BY MOUTH DAILY. (STOP SIMVASTATIN) (Patient taking differently: daily.) 30 tablet 6   budesonide-formoterol (SYMBICORT) 160-4.5  MCG/ACT inhaler Inhale 2 puffs into the lungs daily.     carvedilol (COREG) 25 MG tablet Take 25 mg by mouth 2 (two) times daily with a meal.     docusate sodium (COLACE) 100 MG capsule Take 1 capsule (100 mg total) by mouth 2 (two) times daily. (Patient taking differently: Take 100 mg by mouth daily.) 10 capsule 0   ferrous sulfate 325 (65 FE) MG tablet Take 1 tablet (325 mg total) by mouth 3 (three) times daily after meals for 14 days. 42 tablet 0   fluticasone (FLONASE) 50 MCG/ACT nasal spray Place 1 spray into both nostrils in the morning and at bedtime.     gabapentin (NEURONTIN) 300 MG capsule Take 600 mg by mouth at bedtime.      glipiZIDE (GLUCOTROL XL) 5 MG 24 hr tablet Take 5 mg by mouth every morning.     insulin glargine (LANTUS) 100 UNIT/ML injection Inject 35 Units into the skin at bedtime.     isosorbide mononitrate (IMDUR) 30 MG 24 hr tablet TAKE 1 & 1/2 TABLETS (45MG) BY MOUTH ONCE DAILY (Patient taking differently: Take 45 mg by mouth daily.) 135 tablet 3   levocetirizine (XYZAL) 5 MG tablet Take 5 mg by mouth every evening.     metFORMIN (GLUCOPHAGE-XR) 500 MG 24 hr tablet Take 500 mg by mouth 2 (two) times daily.     methocarbamol (ROBAXIN) 500 MG tablet Take 1 tablet (500 mg total) by mouth every 6 (six) hours as needed for muscle spasms. 40 tablet 0  montelukast (SINGULAIR) 10 MG tablet Take 10 mg by mouth at bedtime.     Multiple Vitamin (MULTIVITAMIN WITH MINERALS) TABS tablet Take 1 tablet by mouth daily.      nitroGLYCERIN (NITROSTAT) 0.4 MG SL tablet Place 1 tablet (0.4 mg total) under the tongue every 5 (five) minutes as needed for chest pain. 25 tablet 3   ondansetron (ZOFRAN ODT) 4 MG disintegrating tablet Take 1 tablet (4 mg total) by mouth every 8 (eight) hours as needed for nausea or vomiting. 20 tablet 0   oxyCODONE 10 MG TABS Take 1-2 tablets (10-20 mg total) by mouth every 4 (four) hours as needed for severe pain (pain score 7-10). 56 tablet 0   OZEMPIC, 0.25 OR  0.5 MG/DOSE, 2 MG/1.5ML SOPN Inject 0.5 mg into the skin every Thursday.     pantoprazole (PROTONIX) 40 MG tablet Take 40 mg by mouth daily.     polyethylene glycol (MIRALAX / GLYCOLAX) 17 g packet Take 17 g by mouth daily as needed for mild constipation. 14 each 0   PRESCRIPTION MEDICATION Inhale into the lungs at bedtime. CPAP     rivaroxaban (XARELTO) 10 MG TABS tablet Take 1 tablet (10 mg total) by mouth daily with breakfast for 14 days. After completing this medication, please take aspirin 81 mg twice daily for another month to prevent blood clot. 14 tablet 0   torsemide (DEMADEX) 20 MG tablet Take 1 tablet (20 mg total) by mouth 2 (two) times daily. 180 tablet 0   valsartan (DIOVAN) 80 MG tablet Take 80 mg by mouth daily.     vitamin C (VITAMIN C) 1000 MG tablet Take 1 tablet (1,000 mg total) by mouth daily. 30 tablet 0   zolpidem (AMBIEN) 10 MG tablet Take 10 mg by mouth at bedtime as needed for sleep.     No current facility-administered medications for this encounter.    Physical Findings: In general this is a well appearing Caucasian male in no acute distress. He's alert and oriented x4 and appropriate throughout the examination. Cardiopulmonary assessment is negative for acute distress and he exhibits normal effort.   Lab Findings: Lab Results  Component Value Date   WBC 5.5 12/28/2020   HGB 9.7 (L) 12/28/2020   HCT 29.6 (L) 12/28/2020   MCV 97.0 12/28/2020   PLT 194 12/28/2020    Radiographic Findings:  Patient underwent CT imaging in our clinic for post implant dosimetry. The CT will be reviewed by Dr. Tammi Klippel to confirm there is an adequate distribution of radioactive seeds throughout the prostate gland and ensure that there are no seeds in or near the rectum.  We suspect the final radiation plan and dosimetry will show appropriate coverage of the prostate gland. He understands that we will call and inform him of any unexpected findings on further review of his imaging and  dosimetry.  Impression/Plan: 75 y.o. 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 Lynnette Caffey and will receive 45 Gy in 25 fractions of 1.8 Gy in [place], to supplement an up-front prostate seed implant boost of 110 Gy to achieve a total nominal dose of 155 Gy. We enjoyed taking care of him and look forward to continuing to follow his progress via correspondence. I did encourage the patient to contact our office or return at any point if he has questions or concerns related to his previous radiation and prostate cancer.  Nicholos Johns, PA-C

## 2021-01-29 NOTE — Progress Notes (Signed)
Patient reports unrelated LT knee pain 8/10 otherwise doing well.  No urinary management medications at this time. Urology follow up- November 11th, 2022- per patient.  Meaningful use complete.  BP (!) 137/58 (BP Location: Right Arm, Patient Position: Sitting, Cuff Size: Large)   Pulse 65   Temp 97.9 F (36.6 C)   Resp 20   Ht 5\' 10"  (1.778 m)   Wt 243 lb 9.6 oz (110.5 kg)   SpO2 99%   BMI 34.95 kg/m

## 2021-02-01 ENCOUNTER — Encounter: Payer: Self-pay | Admitting: Urology

## 2021-02-01 ENCOUNTER — Encounter: Payer: Self-pay | Admitting: Radiation Oncology

## 2021-02-01 DIAGNOSIS — C61 Malignant neoplasm of prostate: Secondary | ICD-10-CM | POA: Diagnosis not present

## 2021-02-01 NOTE — Progress Notes (Signed)
Patient scheduled for CT SIM/treatment planning with Dr. Lynnette Caffey in [facility] 02/02/21. [name]

## 2021-02-01 NOTE — Progress Notes (Signed)
  Radiation Oncology         [phone] ________________________________  Name: [name] MRN: [id number]  Date: 02/01/2021  DOB: Sep 01, 1945  3D Planning Note   Prostate Brachytherapy Post-Implant Dosimetry  Diagnosis: 75 y.o. 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 He underwent CT scan complex simulation to delineate the three-dimensional structures of the pelvis and demonstrate the radiation distribution.  Since that time, the seed localization, and complex isodose planning with dose volume histograms have now been completed.  Results:   Prostate Coverage - The dose of radiation delivered to the 90% or more of the prostate gland (D90) was 101.78% of the prescription dose. This exceeds our goal of greater than 90%. Rectal Sparing - The volume of rectal tissue receiving the prescription dose or higher was 0.0 cc. This falls under our threshold tolerance of 1.0 cc.  Impression: The prostate seed implant appears to show adequate target coverage and appropriate rectal sparing.  Plan:  The patient will continue to follow with urology for ongoing PSA determinations. I would anticipate a high likelihood for local tumor control with minimal risk for rectal morbidity.  ________________________________  Sheral Apley Tammi Klippel, M.D.

## 2021-04-20 ENCOUNTER — Encounter: Payer: Self-pay | Admitting: Cardiology

## 2021-04-26 ENCOUNTER — Encounter: Payer: Self-pay | Admitting: Cardiology

## 2021-04-26 ENCOUNTER — Ambulatory Visit: Payer: Medicare Other | Admitting: Cardiology

## 2021-04-26 ENCOUNTER — Encounter: Payer: Self-pay | Admitting: *Deleted

## 2021-04-26 VITALS — BP 130/64 | HR 70 | Ht 70.0 in | Wt 250.4 lb

## 2021-04-26 DIAGNOSIS — I10 Essential (primary) hypertension: Secondary | ICD-10-CM | POA: Diagnosis not present

## 2021-04-26 DIAGNOSIS — I2510 Atherosclerotic heart disease of native coronary artery without angina pectoris: Secondary | ICD-10-CM | POA: Diagnosis not present

## 2021-04-26 DIAGNOSIS — Z024 Encounter for examination for driving license: Secondary | ICD-10-CM | POA: Diagnosis not present

## 2021-04-26 DIAGNOSIS — E782 Mixed hyperlipidemia: Secondary | ICD-10-CM

## 2021-04-26 DIAGNOSIS — R600 Localized edema: Secondary | ICD-10-CM

## 2021-04-26 NOTE — Patient Instructions (Signed)
Medication Instructions:  Continue all current medications.  Labwork: none  Testing/Procedures: Your physician has requested that you have a lexiscan myoview. For further information please visit [email or web address]. Please follow instruction sheet, as given. Office will contact with results via phone or letter.     Follow-Up: 6 months   Any Other Special Instructions Will Be Listed Below (If Applicable).   If you need a refill on your cardiac medications before your next appointment, please call your pharmacy.

## 2021-04-26 NOTE — Progress Notes (Signed)
Clinical Summary Mr. Fujiwara is a 76 y.o.malemale seen today for follow up of the following medical problems.    1. CAD   - hx of BMS to LAD in 2003   - cath 01/2011 with LM patent, LAD with prox stent mild ISR, patent mid stent, 90% mid LAD lesion. LCX 30% prox, OM2 30-40%, RCA small non-dom with no disease. Received additional stent to LAD at that time, a septal Dywane Peruski was jailed. LVEF 65% by LV gram.   - exercise cardiolite from 08/2013 from prior cardiologist. 6 minutes with no ischemic changes, 87%THR, LVEF 58%, moderate size mild intensity partially reversible inferior defect thought to be soft tissue attenuation but cannot rule out RCA ischemia -[name] admit with chest pain 04/2014. Sharp pain mid to left chest, 10/10. No other associated symptoms. Not positional. Better with NG. Symptoms would last just a few minutes at at time. Lexiscan MPI during admission with no ischemia, LVEF 60%. 05/2014 cath at [facility] patent coronaries  -01/2017 echo: LVEF 10-27%, grade I diastolic dysfunction     25/3664 nuclear stress: probable normal study, cannot exclude small distal inferior ischemia. 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 No SOB/DOe  - compliant with meds   2. LE swelling - has had prior leg surgeries    - chronic swelling, takes torsemide and is overalls table       3. HTN - compliant with meds     4. Hyperlipidemia  - recent labs with pcp, he is on atrorvastatin     5. COPD - night time oxygen. - followed by Dr Clydene Pugh    6. Femur fracture - admitted 03/2019 after fall - s/p surgery   7. Prostate cancer     SH: his wife Jontue Crumpacker is also a patient of mine. SHe is currently admitted to [facility] with seizures   Past Medical History:  Diagnosis Date   Arthritis    Asthma    Cancer [facility])    Prostate   COPD (chronic obstructive pulmonary disease) ([place])     Coronary atherosclerosis of native coronary artery 04/22/2001      Cardiac Catheterization   COVID 10/2020   asymptomatic home test due to covid exposure   Diverticulitis    Dyspnea    WITH EXERTION   Encounter for long-term (current) use of insulin (HCC)    Esophageal reflux    Gout, unspecified    History of blood transfusion    1 unit after 12-15-2020 surgery no problems with   History of kidney stones    Neuropathy    toes all the time   Old myocardial infarction 2013   Personal history of tobacco use, presenting hazards to health    Postsurgical percutaneous transluminal coronary angioplasty status    Prostate cancer ([place])    Pure hypercholesterolemia    Skin cancer    Sleep apnea    USES CPAP   Type II or unspecified type diabetes mellitus without mention of complication, not stated as uncontrolled    TYPE 11   Unspecified essential hypertension    Wears glasses 12/24/2020     Allergies  Allergen Reactions   Lisinopril Cough     Current Outpatient Medications  Medication Sig Dispense Refill   acarbose (PRECOSE) 100 MG tablet Take 100 mg by mouth 3 (three) times daily.  allopurinol (ZYLOPRIM) 300 MG tablet Take 300 mg by mouth daily.      amLODipine (NORVASC) 10 MG tablet Take 1 tablet (10 mg total) by mouth daily. (Patient taking differently: Take 10 mg by mouth at bedtime.) 30 tablet 6   atorvastatin (LIPITOR) 80 MG tablet TAKE 1 TABLET (80 MG TOTAL) BY MOUTH DAILY. (STOP SIMVASTATIN) (Patient taking differently: daily.) 30 tablet 6   budesonide-formoterol (SYMBICORT) 160-4.5 MCG/ACT inhaler Inhale 2 puffs into the lungs daily.     carvedilol (COREG) 25 MG tablet Take 25 mg by mouth 2 (two) times daily with a meal.     docusate sodium (COLACE) 100 MG capsule Take 1 capsule (100 mg total) by mouth 2 (two) times daily. (Patient taking differently: Take 100 mg by mouth daily.) 10 capsule 0   ferrous sulfate 325 (65 FE) MG tablet Take 1 tablet (325 mg total) by mouth  3 (three) times daily after meals for 14 days. 42 tablet 0   fluticasone (FLONASE) 50 MCG/ACT nasal spray Place 1 spray into both nostrils in the morning and at bedtime.     gabapentin (NEURONTIN) 300 MG capsule Take 600 mg by mouth at bedtime.      glipiZIDE (GLUCOTROL XL) 5 MG 24 hr tablet Take 5 mg by mouth every morning.     insulin glargine (LANTUS) 100 UNIT/ML injection Inject 35 Units into the skin at bedtime.     isosorbide mononitrate (IMDUR) 30 MG 24 hr tablet TAKE 1 & 1/2 TABLETS (45MG ) BY MOUTH ONCE DAILY (Patient taking differently: Take 45 mg by mouth daily.) 135 tablet 3   levocetirizine (XYZAL) 5 MG tablet Take 5 mg by mouth every evening.     metFORMIN (GLUCOPHAGE-XR) 500 MG 24 hr tablet Take 500 mg by mouth 2 (two) times daily.     methocarbamol (ROBAXIN) 500 MG tablet Take 1 tablet (500 mg total) by mouth every 6 (six) hours as needed for muscle spasms. 40 tablet 0   montelukast (SINGULAIR) 10 MG tablet Take 10 mg by mouth at bedtime.     Multiple Vitamin (MULTIVITAMIN WITH MINERALS) TABS tablet Take 1 tablet by mouth daily.      nitroGLYCERIN (NITROSTAT) 0.4 MG SL tablet Place 1 tablet (0.4 mg total) under the tongue every 5 (five) minutes as needed for chest pain. 25 tablet 3   ondansetron (ZOFRAN ODT) 4 MG disintegrating tablet Take 1 tablet (4 mg total) by mouth every 8 (eight) hours as needed for nausea or vomiting. 20 tablet 0   oxyCODONE 10 MG TABS Take 1-2 tablets (10-20 mg total) by mouth every 4 (four) hours as needed for severe pain (pain score 7-10). 56 tablet 0   OZEMPIC, 0.25 OR 0.5 MG/DOSE, 2 MG/1.5ML SOPN Inject 0.5 mg into the skin every Thursday.     pantoprazole (PROTONIX) 40 MG tablet Take 40 mg by mouth daily.     polyethylene glycol (MIRALAX / GLYCOLAX) 17 g packet Take 17 g by mouth daily as needed for mild constipation. 14 each 0   PRESCRIPTION MEDICATION Inhale into the lungs at bedtime. CPAP     rivaroxaban (XARELTO) 10 MG TABS tablet Take 1 tablet (10  mg total) by mouth daily with breakfast for 14 days. After completing this medication, please take aspirin 81 mg twice daily for another month to prevent blood clot. 14 tablet 0   torsemide (DEMADEX) 20 MG tablet Take 1 tablet (20 mg total) by mouth 2 (two) times daily. 180 tablet 0  valsartan (DIOVAN) 80 MG tablet Take 80 mg by mouth daily.     vitamin C (VITAMIN C) 1000 MG tablet Take 1 tablet (1,000 mg total) by mouth daily. 30 tablet 0   zolpidem (AMBIEN) 10 MG tablet Take 10 mg by mouth at bedtime as needed for sleep.     No current facility-administered medications for this visit.     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 Left dominent   CARDIAC CATHETERIZATION  01/26/2011   LAD: Patent proximal stent. 90% calcified eccentic stenosis in med segment, mild LCX disease   CAROTID STENTS     CORONARY ANGIOPLASTY WITH STENT PLACEMENT  2003   Prox LAD: 3.0 X12 BMS   CORONARY ANGIOPLASTY WITH STENT PLACEMENT  01/26/2011   Mid LAD: 3.0 X15 mm Vision BMS. Complicated by a jailed septal Rizwan Kuyper and periprocedural MI   EYE SURGERY Bilateral    laser for bleed   HEMORRHOID SURGERY     yrs ago per pt on 12-24-2020   HERNIA REPAIR     INGUINAL YRS AGO per pt on 12-24-2020   HUMERUS FRACTURE SURGERY Left    LEFT TOTAK KNEE REPLACEMENT  2015   IN [facility]   NASAL SINUS SURGERY     yrs ago per pt on 12-24-2020   ORIF FEMUR FRACTURE Left 03/19/2019   Procedure: OPEN REDUCTION INTERNAL FIXATION (ORIF) DISTAL FEMUR FRACTURE;  Surgeon: Altamese [place], MD;  Location: [place];  Service: Orthopedics;  Laterality: Left;   ORIF FEMUR FRACTURE Left 08/06/2019   Procedure: REPAIR DISTAL FEMUR NONUNION WITH INFUSE AND ALLOGRAFT;  Surgeon: Altamese [place], MD;  Location: [place];  Service: Orthopedics;  Laterality: Left;   RADIOACTIVE SEED IMPLANT N/A 12/28/2020    Procedure: RADIOACTIVE SEED IMPLANT/BRACHYTHERAPY IMPLANT;  Surgeon: Franchot Gallo, MD;  Location: [facility];  Service: Urology;  Laterality: N/A;   SPACE OAR INSTILLATION N/A 12/28/2020   Procedure: SPACE OAR INSTILLATION;  Surgeon: Franchot Gallo, MD;  Location: [facility];  Service: Urology;  Laterality: N/A;   TOTAL KNEE REVISION Left 12/15/2020   Procedure: LEFT TOTAL KNEE REVISION WITH REMOVAL OF HARDWARE;  Surgeon: Paralee Cancel, MD;  Location: [facility];  Service: Orthopedics;  Laterality: Left;     Allergies  Allergen Reactions   Lisinopril Cough      Family History  Problem Relation Age of Onset   Coronary artery disease Other        Family History   Breast cancer Neg Hx    Colon cancer Neg Hx    Prostate cancer Neg Hx    Pancreatic cancer Neg Hx      Social History Mr. Tumbleson reports that he quit smoking about 36 years ago. His smoking use included cigarettes. He started smoking about 61 years ago. He has a 39.00 pack-year smoking history. He quit smokeless tobacco use about 36 years ago.  His smokeless tobacco use included snuff and chew. Mr. Tedesco reports that he does not currently use alcohol.   Review of Systems CONSTITUTIONAL: No weight loss, fever, chills, weakness or fatigue.  HEENT: Eyes: No visual loss, blurred vision, double vision or yellow sclerae.No hearing loss, sneezing, congestion, runny nose or sore throat.  SKIN: No rash or itching.  CARDIOVASCULAR: per hpi RESPIRATORY: No shortness of breath, cough or sputum.  GASTROINTESTINAL: No anorexia, nausea, vomiting or diarrhea. No abdominal pain or blood.  GENITOURINARY: No burning on urination, no polyuria NEUROLOGICAL: No headache, dizziness, syncope, paralysis, ataxia, numbness or tingling in the extremities. No change in bowel or bladder control.  MUSCULOSKELETAL: No muscle, back pain, joint pain or stiffness.  LYMPHATICS: No enlarged nodes. No history of  splenectomy.  PSYCHIATRIC: No history of depression or anxiety.  ENDOCRINOLOGIC: No reports of sweating, cold or heat intolerance. No polyuria or polydipsia.  Marland Kitchen   Physical Examination Today's Vitals   04/26/21 0806  BP: 130/64  Pulse: 70  SpO2: 98%  Weight: 250 lb 6.4 oz (113.6 kg)  Height: 5\' 10"  (1.778 m)   Body mass index is 35.93 kg/m.  Gen: resting comfortably, no acute distress HEENT: no scleral icterus, pupils equal round and reactive, no palptable cervical adenopathy,  CV: RRR, no m/r/g no jvd Resp: Clear to auscultation bilaterally GI: abdomen is soft, non-tender, non-distended, normal bowel sounds, no hepatosplenomegaly MSK: extremities are warm, no edema.  Skin: warm, no rash Neuro:  no focal deficits Psych: appropriate affect   Diagnostic Studies     Assessment and Plan   1. 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 LE edema - prior Echo with normal LVEF, grade I diasotlic dysfunction - chronic stable edema, continue torsemide  3. HTN - at goal, continue current meds   4. Hyperlipidemia - historically has been at goal, request pcp labs, continue statin     Arnoldo Lenis, M.D.

## 2021-04-28 ENCOUNTER — Other Ambulatory Visit: Payer: Self-pay

## 2021-04-28 ENCOUNTER — Encounter: Payer: Self-pay | Admitting: *Deleted

## 2021-04-28 ENCOUNTER — Encounter (HOSPITAL_COMMUNITY)
Admission: RE | Admit: 2021-04-28 | Discharge: 2021-04-28 | Disposition: A | Discharge status: Home / Self Care | Payer: Medicare Other | Source: Ambulatory Visit | Attending: Cardiology | Admitting: Cardiology

## 2021-04-28 ENCOUNTER — Ambulatory Visit (HOSPITAL_COMMUNITY)
Admission: RE | Admit: 2021-04-28 | Discharge: 2021-04-28 | Disposition: A | Discharge status: Home / Self Care | Payer: Medicare Other | Source: Ambulatory Visit | Attending: Cardiology | Admitting: Cardiology

## 2021-04-28 DIAGNOSIS — I2510 Atherosclerotic heart disease of native coronary artery without angina pectoris: Secondary | ICD-10-CM | POA: Diagnosis not present

## 2021-04-28 DIAGNOSIS — Z024 Encounter for examination for driving license: Secondary | ICD-10-CM | POA: Diagnosis present

## 2021-04-28 LAB — NM MYOCAR MULTI W/SPECT W/WALL MOTION / EF
LV dias vol: 125 mL (ref 62–150)
LV sys vol: 45 mL
Nuc Stress EF: 64 %
Peak HR: 81 {beats}/min
RATE: 0.5
Rest HR: 68 {beats}/min
Rest Nuclear Isotope Dose: 10.2 mCi
SDS: 2
SRS: 1
SSS: 3
ST Depression (mm): 0 mm
Stress Nuclear Isotope Dose: 30 mCi
TID: 1.32

## 2021-04-28 MED ORDER — SODIUM CHLORIDE FLUSH 0.9 % IV SOLN
INTRAVENOUS | Status: AC
  Administered 2021-04-28: 10 mL via INTRAVENOUS
  Filled 2021-04-28: qty 10

## 2021-04-28 MED ORDER — TECHNETIUM TC 99M TETROFOSMIN IV KIT
10.0000 | PACK | Freq: Once | INTRAVENOUS | Status: AC | PRN
  Administered 2021-04-28: 10.2 via INTRAVENOUS

## 2021-04-28 MED ORDER — REGADENOSON 0.4 MG/5ML IV SOLN
INTRAVENOUS | Status: AC
  Administered 2021-04-28: 0.4 mg via INTRAVENOUS
  Filled 2021-04-28: qty 5

## 2021-04-28 MED ORDER — TECHNETIUM TC 99M TETROFOSMIN IV KIT
30.0000 | PACK | Freq: Once | INTRAVENOUS | Status: AC | PRN
  Administered 2021-04-28: 30 via INTRAVENOUS

## 2021-05-02 IMAGING — RF DG C-ARM 1-60 MIN
1 series · 4 of 4 positions shown · non-contrast
Comparison: March 19, 2019.

CLINICAL DATA: Distal femoral nonunion.

EXAM:
LEFT KNEE - COMPLETE 4+ VIEW; DG C-ARM 1-60 MIN
FLUOROSCOPY TIME:  18 seconds.

[Series 1: run · 4 of 4 slices shown]
[im 1/4]
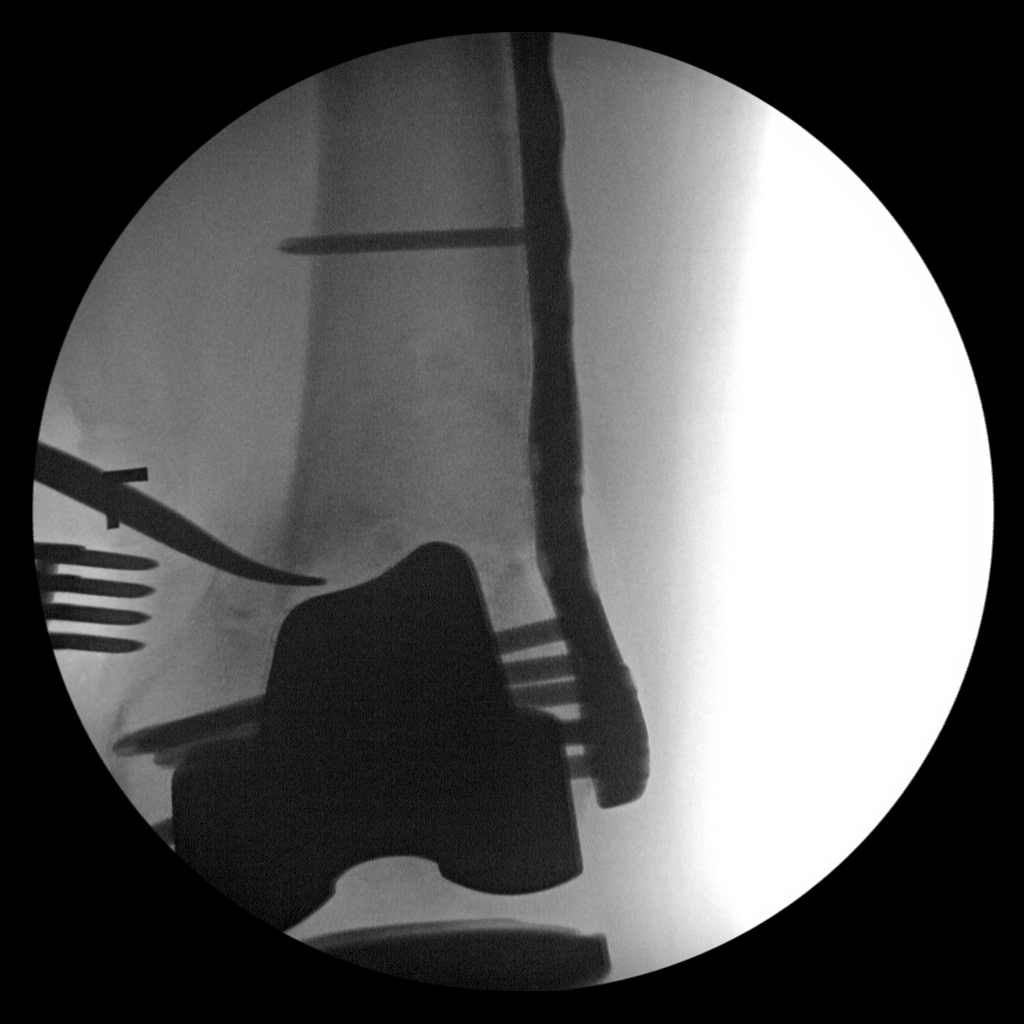
[im 2/4]
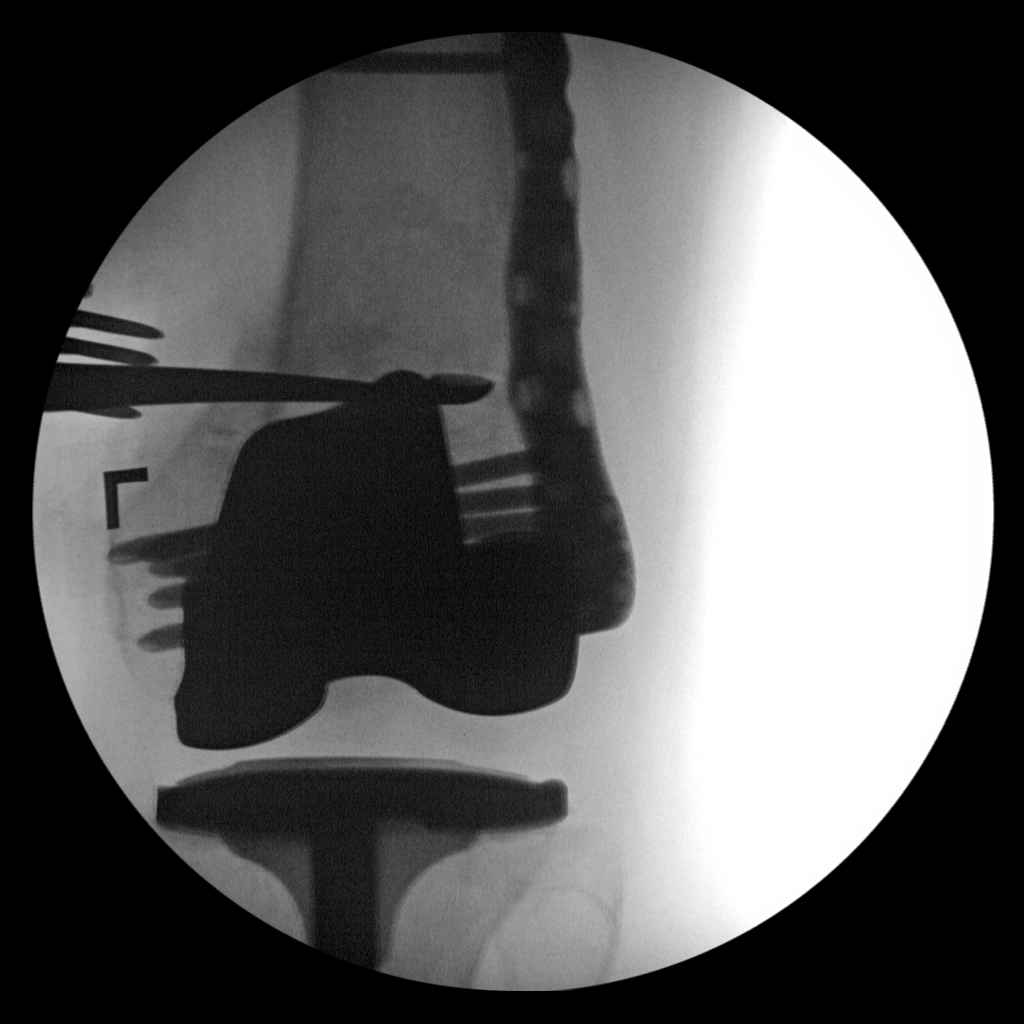
[im 3/4]
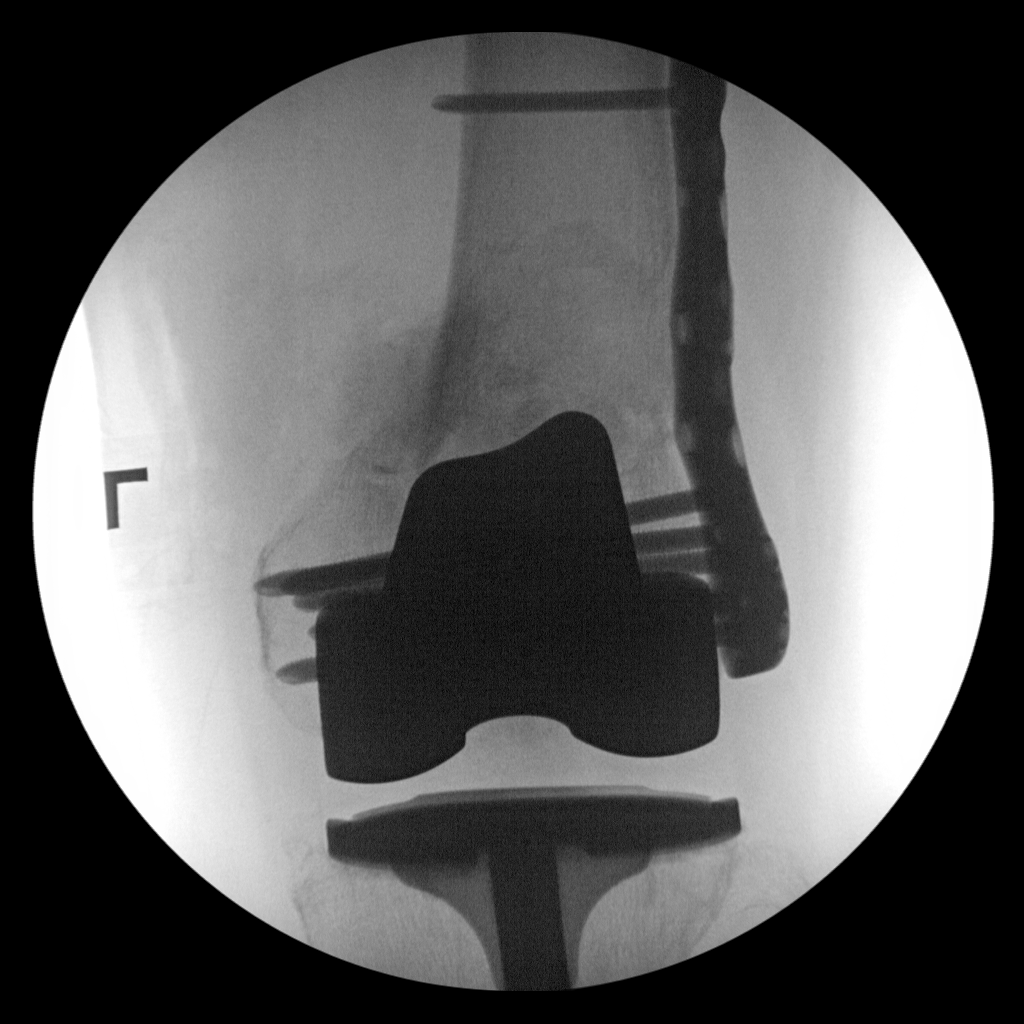
[im 4/4]
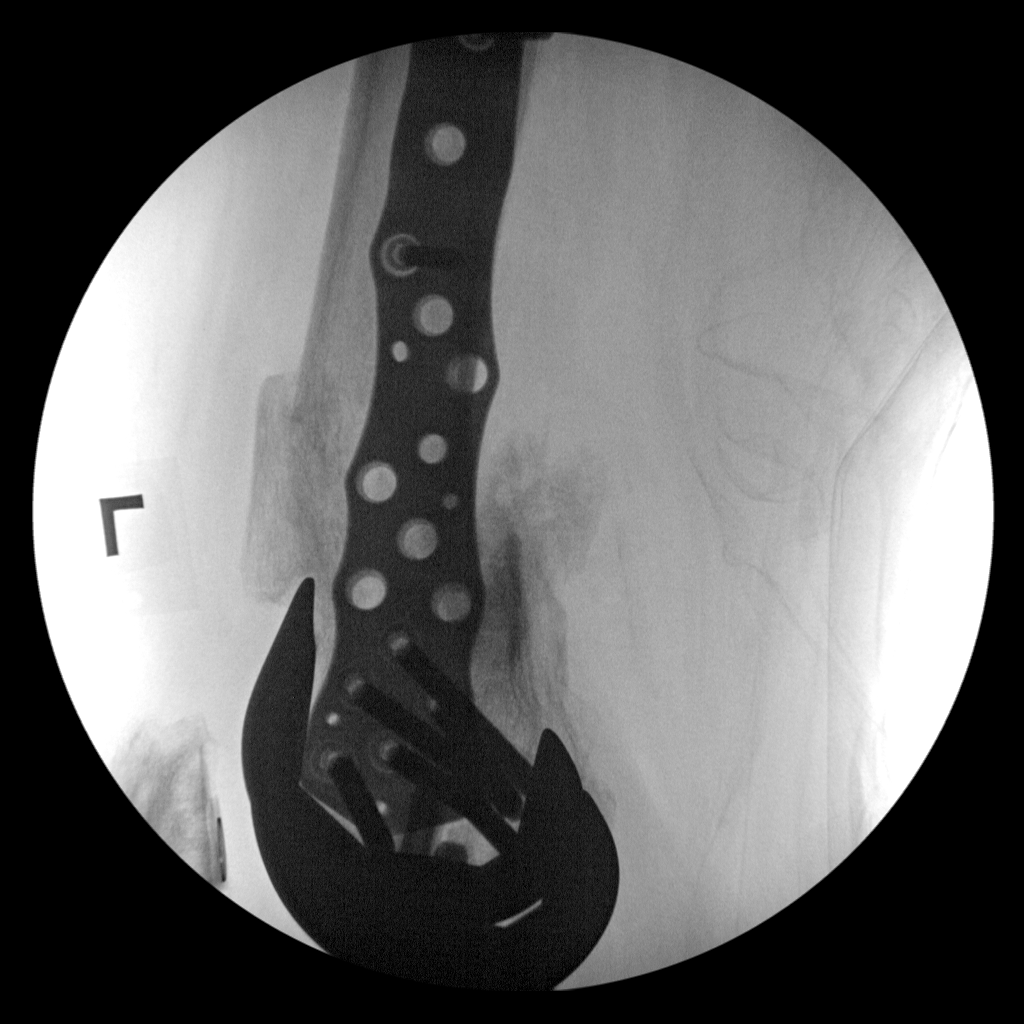

[4 of 4 positions shown; findings below may reference images not displayed]

FINDINGS: Four intraoperative fluoroscopic images were obtained of the distal
left femur. These images demonstrate the patient be status post
surgical internal fixation of old distal left femoral fracture.
IMPRESSION: Fluoroscopic guidance provided during distal left femoral surgery.

## 2021-05-02 IMAGING — CR DG FEMUR 2+V PORT*L*
3 series · 3 of 3 positions shown · non-contrast
Comparison: March 19, 2019.

CLINICAL DATA: Status post left femur nonunion repair.

EXAM:
LEFT FEMUR PORTABLE 2 VIEWS

[AP (1 of 2)]
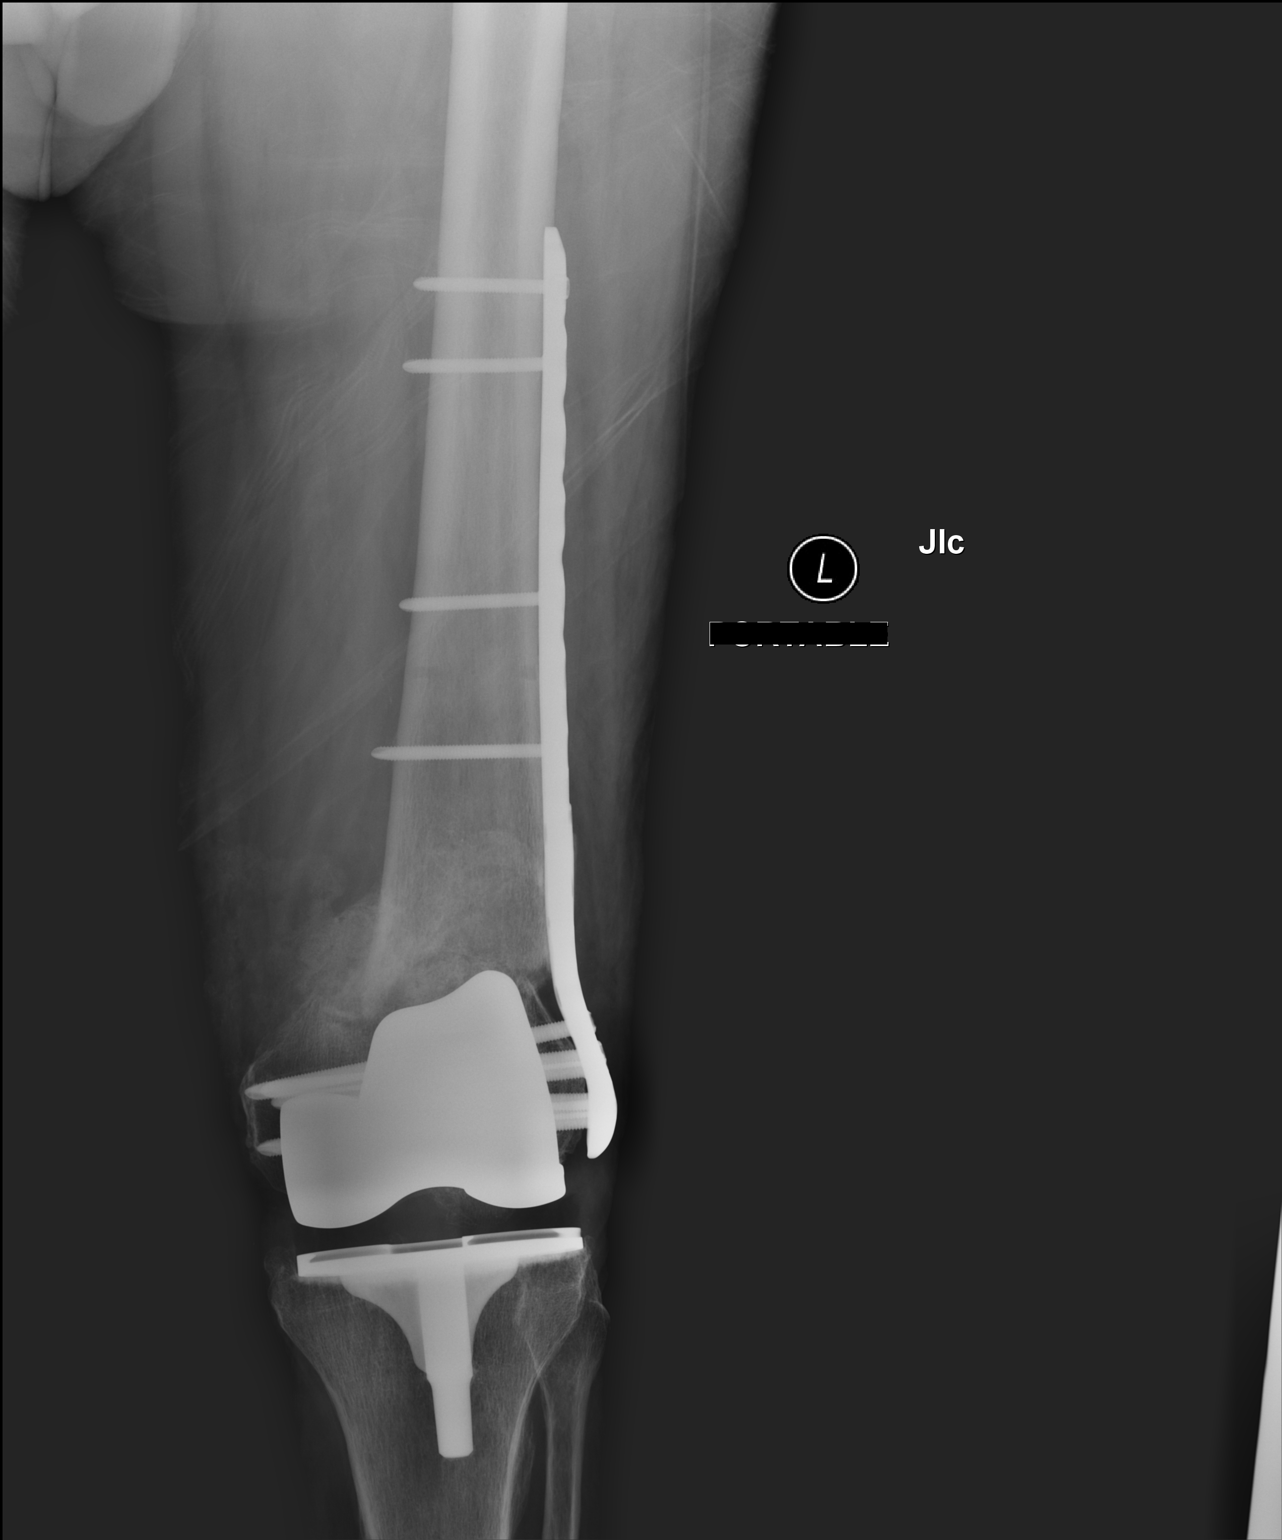

[AP (2 of 2)]
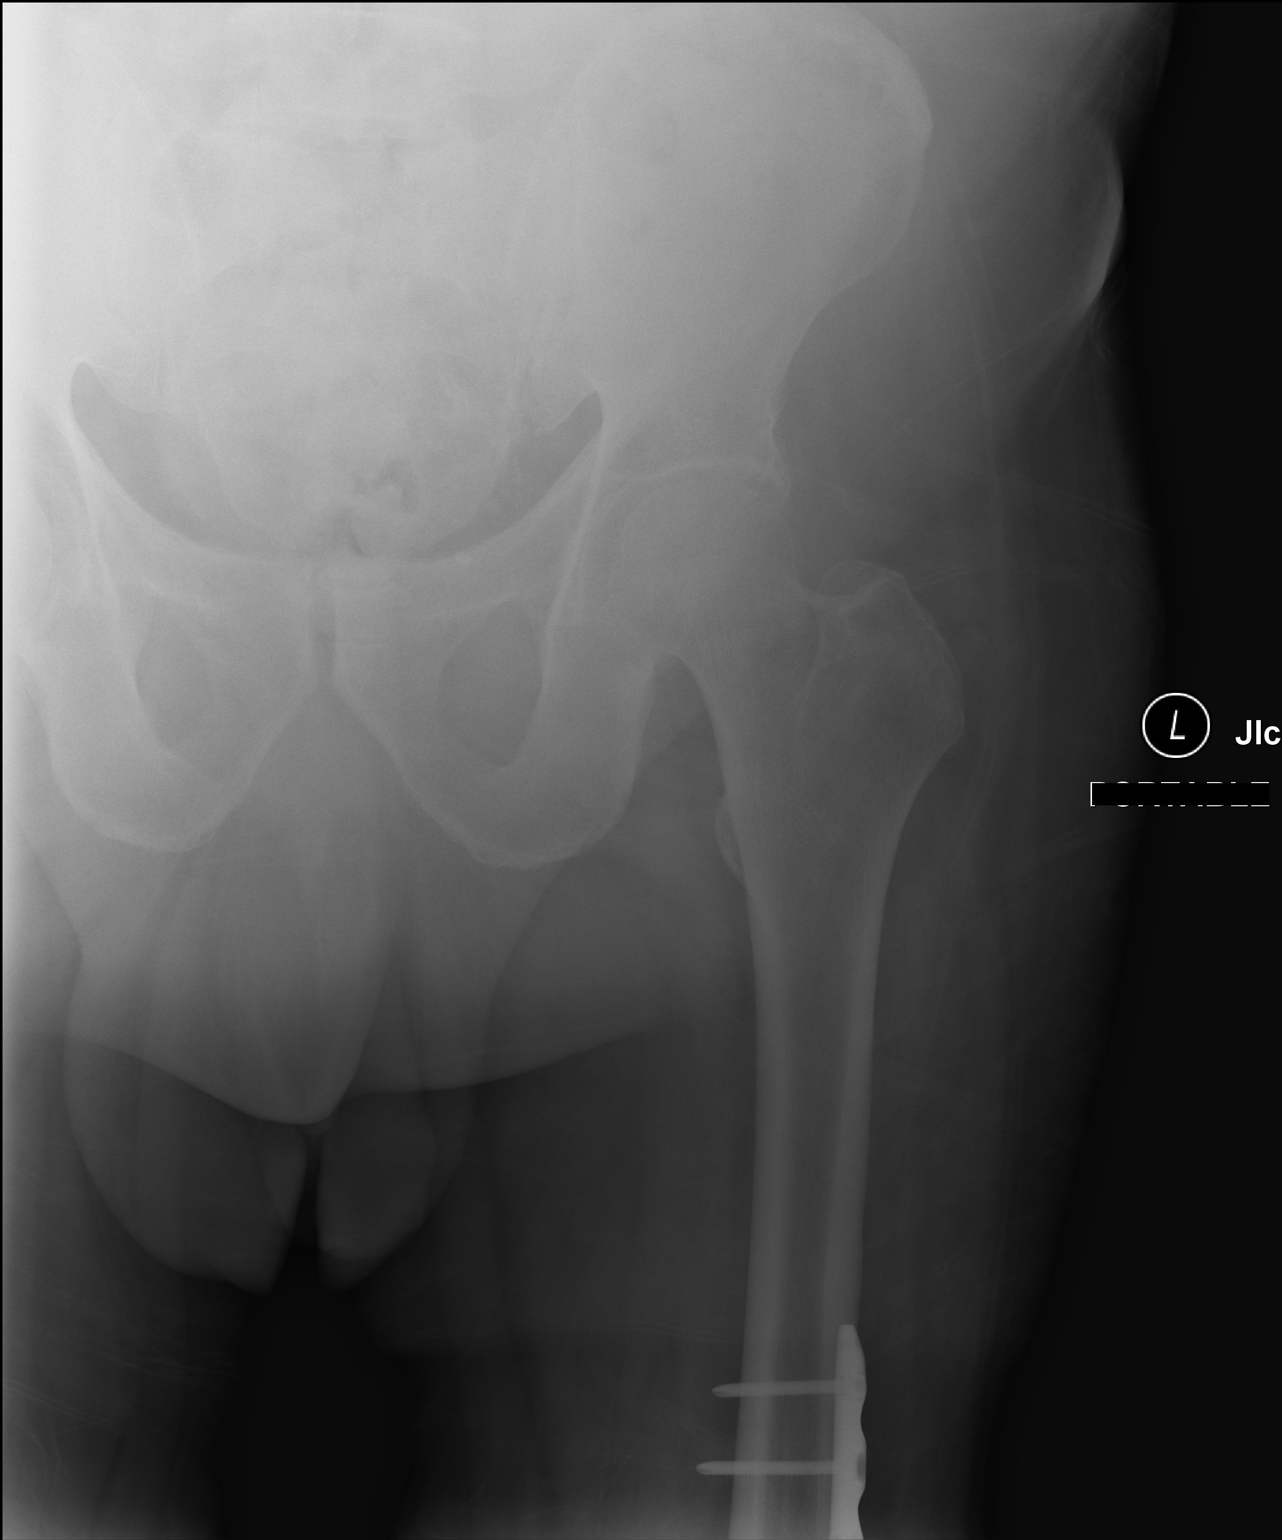

[lateral]
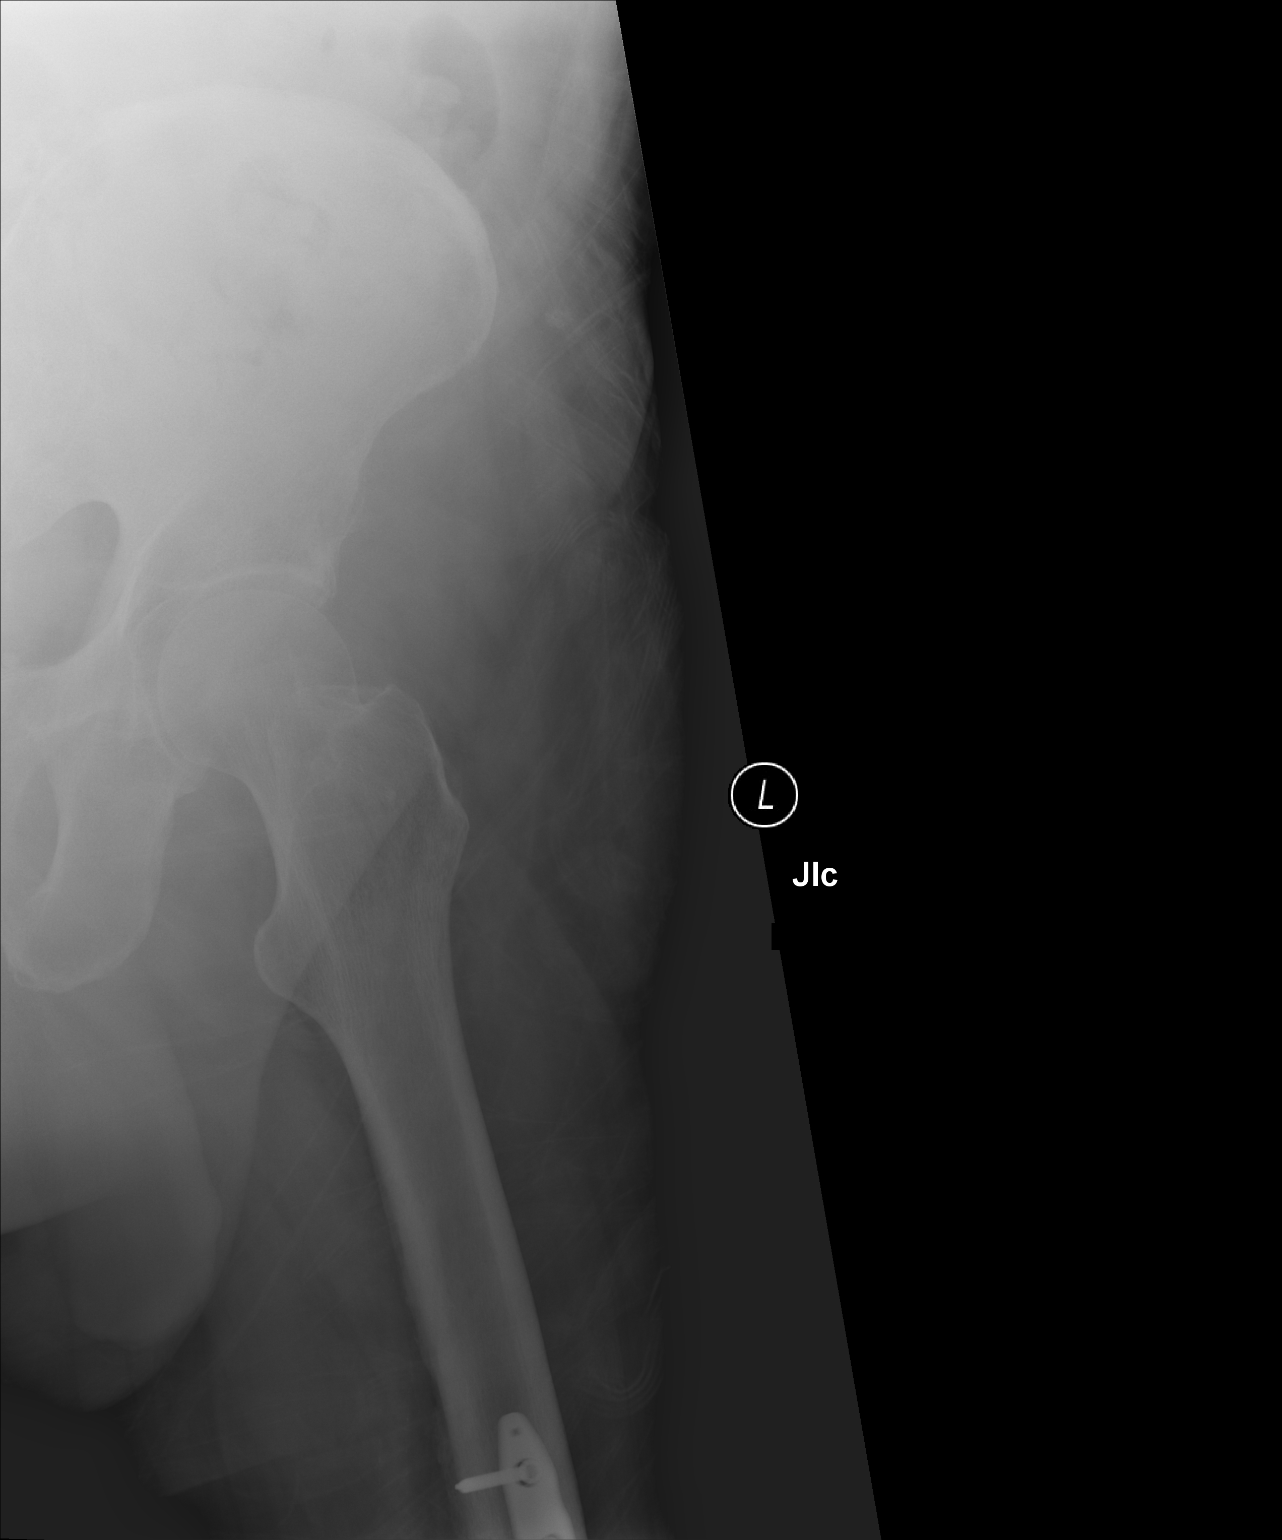

[3 of 3 positions shown; findings below may reference images not displayed]

FINDINGS: Status post surgical internal fixation of old distal left femoral
fracture. No new fracture or dislocation is noted. No soft tissue
abnormality is noted.
IMPRESSION: Status post surgical internal fixation of old distal left femoral
fracture.

## 2021-05-02 IMAGING — RF DG KNEE COMPLETE 4+V*L*
1 series · 4 of 4 positions shown · non-contrast
Comparison: March 19, 2019.

CLINICAL DATA: Distal femoral nonunion.

EXAM:
LEFT KNEE - COMPLETE 4+ VIEW; DG C-ARM 1-60 MIN
FLUOROSCOPY TIME:  18 seconds.

[Series 1: run · 4 of 4 slices shown]
[im 1/4]
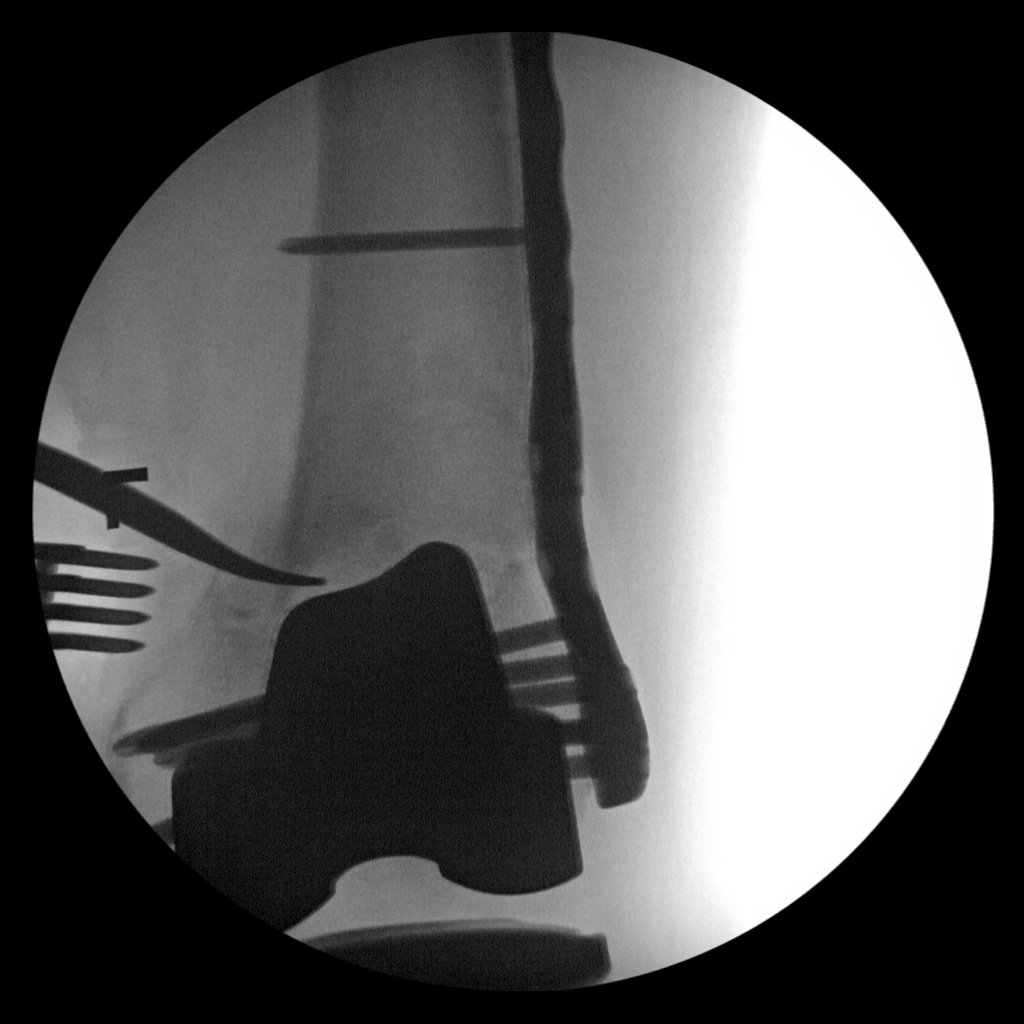
[im 2/4]
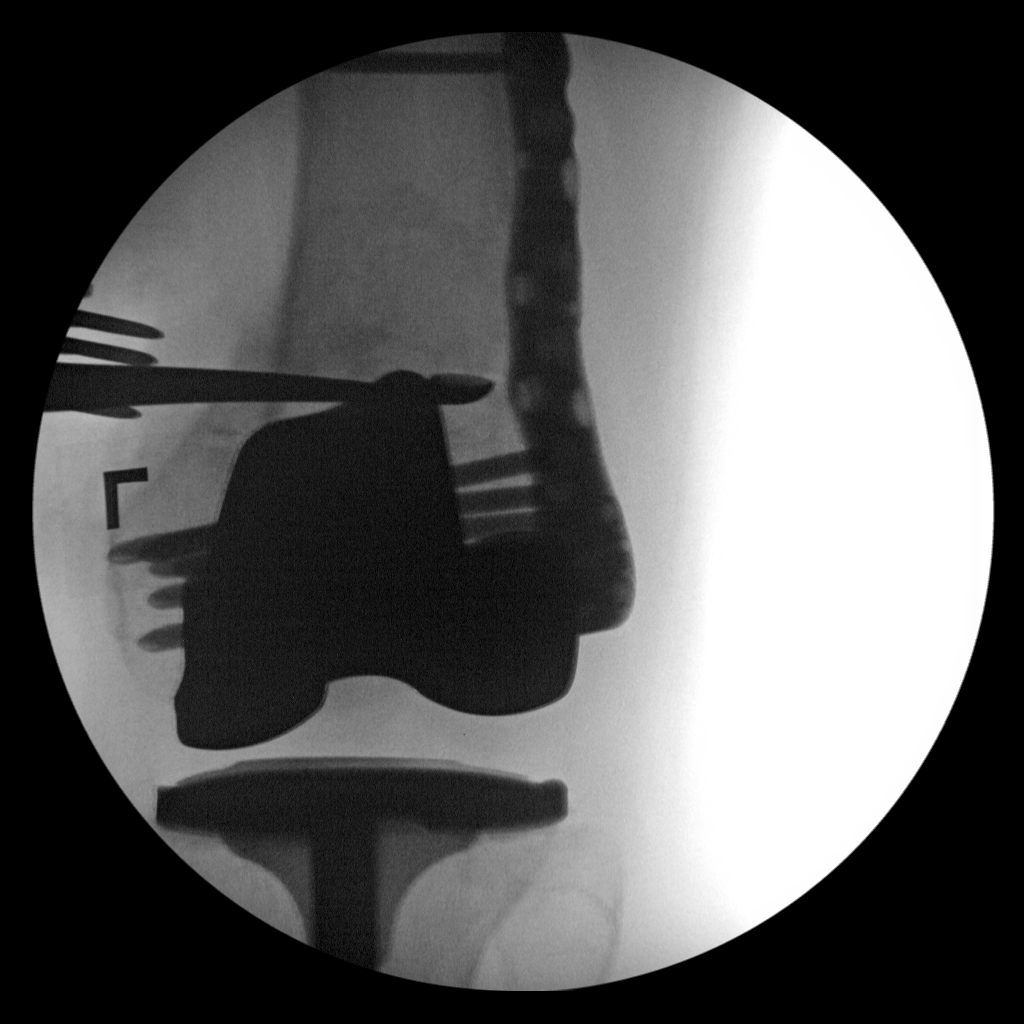
[im 3/4]
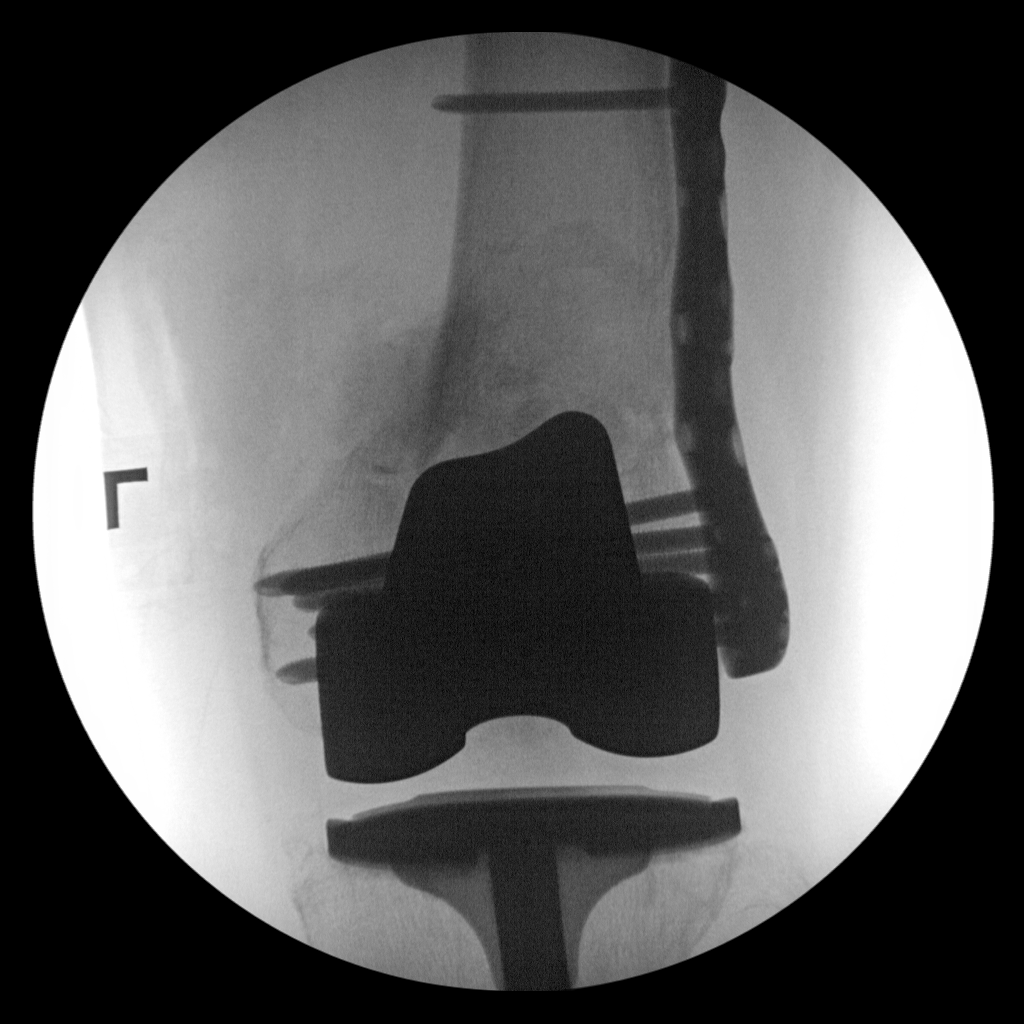
[im 4/4]
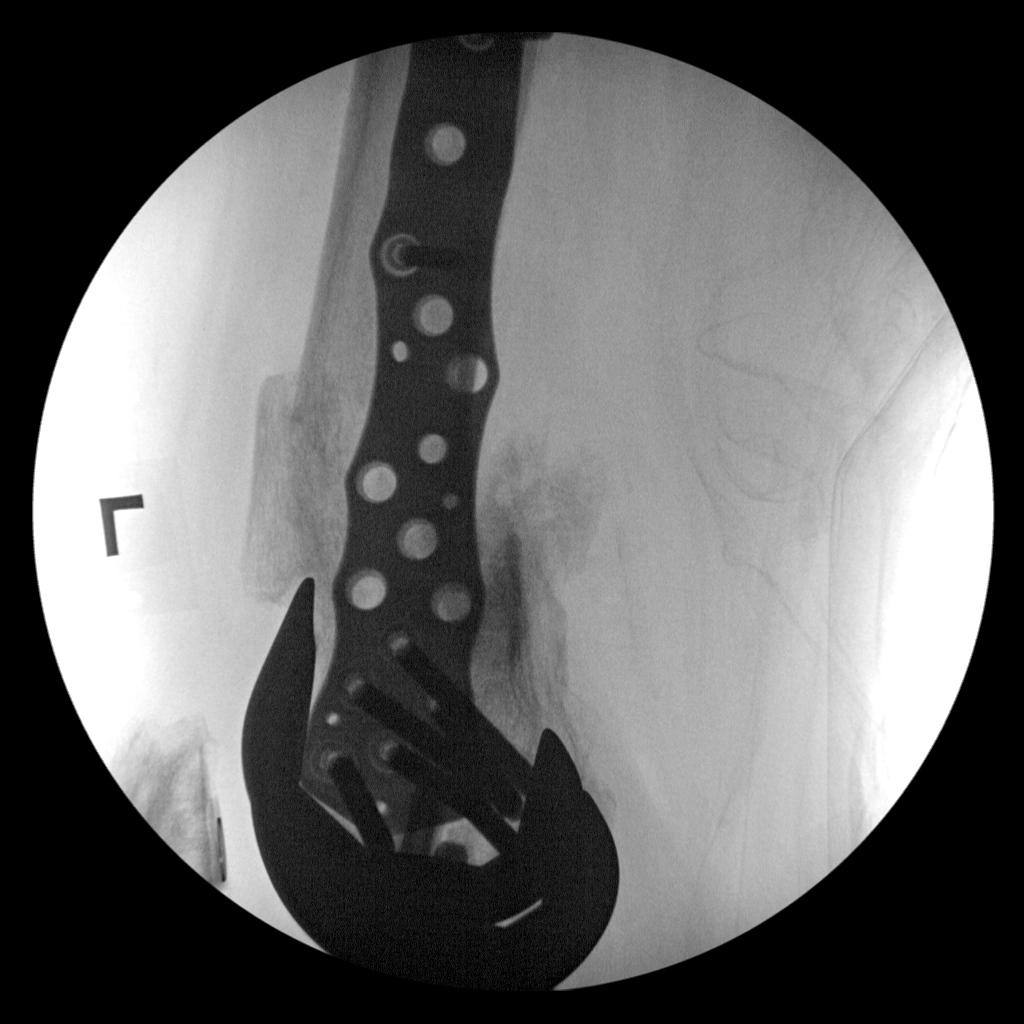

[4 of 4 positions shown; findings below may reference images not displayed]

FINDINGS: Four intraoperative fluoroscopic images were obtained of the distal
left femur. These images demonstrate the patient be status post
surgical internal fixation of old distal left femoral fracture.
IMPRESSION: Fluoroscopic guidance provided during distal left femoral surgery.

## 2021-05-03 ENCOUNTER — Telehealth: Payer: Self-pay | Admitting: Cardiology

## 2021-05-03 ENCOUNTER — Other Ambulatory Visit: Payer: Self-pay | Admitting: Cardiology

## 2021-05-03 NOTE — Telephone Encounter (Signed)
°  Patient called answering service asking if we have received his test results

## 2021-05-03 NOTE — Telephone Encounter (Signed)
Just resulted   Richard Malaia Buchta MD 

## 2021-05-03 NOTE — Telephone Encounter (Signed)
Stress test looks fine. Small area that may represent very small blockage that is not large enough to be of significant concern and would not affect his driving status   Zandra Abts MD

## 2021-05-03 NOTE — Telephone Encounter (Signed)
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 Advised him it has been routed to the provider and he will be contacted once his interpretation is given. Patient verbalized understanding and will wait for a callback.

## 2021-05-03 NOTE — Telephone Encounter (Signed)
[name] with [facility] is following up. She states they have not received what is needed for DOT clearance. She states they need the most recent stress test and echo, and a clearance note.  Fax#: [phone]

## 2021-05-03 NOTE — Telephone Encounter (Signed)
Pt notified and verbalized understanding.

## 2021-05-03 NOTE — Telephone Encounter (Signed)
Pt referring to stress test. Will fwd to provider to result

## 2021-05-11 ENCOUNTER — Other Ambulatory Visit: Payer: Self-pay | Admitting: Cardiology

## 2021-05-18 ENCOUNTER — Ambulatory Visit: Payer: Medicare Other | Admitting: Urology

## 2021-05-18 NOTE — Progress Notes (Incomplete)
H&P  Chief Complaint: Follow-up for prostate cancer  History of Present Illness: [name] is here today for follow-up of his prostate cancer.  In January 2022 he presented with a PSA of 5.5 and a 1.5 cm nodule at the midline base.  4.8.2022: TRUS/BX.  11/12 cores revealed adenocarcinoma, most with GS 4+5 score.  Prostate volume 53 mL. Initial IPSS 4  Patient elected to have LAD ADT plus radiotherapy.  5.20.2022: ADT started with Mills Koller, 240 mg 7.8.2022: Leuprolide 30 mg administered IM 9.12.2022: He underwent I-125 brachytherapy/SpaceOAR placement 11.30.2022: Leuprolide 30 mg administered  12.15.2022: Completed EBRT  1.31.2023:   Past Medical History:  Diagnosis Date   Arthritis    Asthma    Cancer ([place])    Prostate   COPD (chronic obstructive pulmonary disease) ([place])    Coronary atherosclerosis of native coronary artery 04/22/2001      Cardiac Catheterization   COVID 10/2020   asymptomatic home test due to covid exposure   Diverticulitis    Dyspnea    WITH EXERTION   Encounter for long-term (current) use of insulin (HCC)    Esophageal reflux    Gout, unspecified    History of blood transfusion    1 unit after 12-15-2020 surgery no problems with   History of kidney stones    Neuropathy    toes all the time   Old myocardial infarction 2013   Personal history of tobacco use, presenting hazards to health    Postsurgical percutaneous transluminal coronary angioplasty status    Prostate cancer ([place])    Pure hypercholesterolemia    Skin cancer    Sleep apnea    USES CPAP   Type II or unspecified type diabetes mellitus without mention of complication, not stated as uncontrolled    TYPE 11   Unspecified essential hypertension    Wears glasses 12/24/2020    Past Surgical History:  Procedure Laterality Date   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 Left  dominent   CARDIAC CATHETERIZATION  01/26/2011   LAD: Patent proximal stent. 90% calcified eccentic stenosis in med segment, mild LCX disease   CAROTID STENTS     CORONARY ANGIOPLASTY WITH STENT PLACEMENT  2003   Prox LAD: 3.0 X12 BMS   CORONARY ANGIOPLASTY WITH STENT PLACEMENT  01/26/2011   Mid LAD: 3.0 X15 mm Vision BMS. Complicated by a jailed septal branch and periprocedural MI   EYE SURGERY Bilateral    laser for bleed   HEMORRHOID SURGERY     yrs ago per pt on 12-24-2020   HERNIA REPAIR     INGUINAL YRS AGO per pt on 12-24-2020   HUMERUS FRACTURE SURGERY Left    LEFT TOTAK KNEE REPLACEMENT  2015   IN [facility]   NASAL SINUS SURGERY     yrs ago per pt on 12-24-2020   ORIF FEMUR FRACTURE Left 03/19/2019   Procedure: OPEN REDUCTION INTERNAL FIXATION (ORIF) DISTAL FEMUR FRACTURE;  Surgeon: Altamese [place]er[place], MD;  Location: [place];  Service: Orthopedics;  Laterality: Left;   ORIF FEMUR FRACTURE Left 08/06/2019   Procedure: REPAIR DISTAL FEMUR NONUNION WITH INFUSE AND ALLOGRAFT;  Surgeon: Altamese [place]then, MD;  Location: [place];  Service: Orthopedics;  Laterality: Left;   RADIOACTIVE SEED IMPLANT N/A 12/28/2020   Procedure: RADIOACTIVE SEED IMPLANT/BRACHYTHERAPY IMPLANT;  Surgeon: Franchot Gallo, MD;  Location: [facility];  Service: Urology;  Laterality: N/A;   SPACE OAR INSTILLATION N/A 12/28/2020   Procedure: SPACE OAR INSTILLATION;  Surgeon: Franchot Gallo, MD;  Location[facility];  Service: Urology;  Laterality: N/A;   TOTAL KNEE REVISION Left 12/15/2020   Procedure: LEFT TOTAL KNEE REVISION WITH REMOVAL OF HARDWARE;  Surgeon: Paralee Cancel, MD;  [facility];  Service: Orthopedics;  Laterality: Left;    Home Medications:  Allergies as of 05/18/2021       Reactions   Lisinopril Cough        Medication List        Accurate as of May 18, 2021  7:50 AM. If you have any questions, ask your nurse or doctor.          acarbose 100 MG  tablet Commonly known as: PRECOSE Take 100 mg by mouth 3 (three) times daily.   allopurinol 300 MG tablet Commonly known as: ZYLOPRIM Take 300 mg by mouth daily.   amLODipine 10 MG tablet Commonly known as: NORVASC Take 1 tablet (10 mg total) by mouth daily. What changed: when to take this   ascorbic acid 1000 MG tablet Commonly known as: VITAMIN C Take 1 tablet (1,000 mg total) by mouth daily.   aspirin EC 81 MG tablet Take 81 mg by mouth daily. Swallow whole.   atorvastatin 80 MG tablet Commonly known as: LIPITOR TAKE 1 TABLET (80 MG TOTAL) BY MOUTH DAILY. (STOP SIMVASTATIN) What changed: See the new instructions.   budesonide-formoterol 160-4.5 MCG/ACT inhaler Commonly known as: SYMBICORT Inhale 2 puffs into the lungs 2 (two) times daily.   carvedilol 25 MG tablet Commonly known as: COREG Take 25 mg by mouth 2 (two) times daily with a meal.   cholecalciferol 25 MCG (1000 UNIT) tablet Commonly known as: VITAMIN D3 Take 1,000 Units by mouth daily.   diclofenac 75 MG EC tablet Commonly known as: VOLTAREN Take 75 mg by mouth 2 (two) times daily.   glipiZIDE 5 MG 24 hr tablet Commonly known as: GLUCOTROL XL Take 5 mg by mouth every morning.   isosorbide mononitrate 30 MG 24 hr tablet Commonly known as: IMDUR TAKE 1 & 1/2 (ONE & ONE-HALF) TABLETS BY MOUTH ONCE DAILY   levocetirizine 5 MG tablet Commonly known as: XYZAL Take 5 mg by mouth every evening.   magnesium oxide 400 MG tablet Commonly known as: MAG-OX Take 400 mg by mouth daily.   methocarbamol 500 MG tablet Commonly known as: ROBAXIN Take 1 tablet (500 mg total) by mouth every 6 (six) hours as needed for muscle spasms.   montelukast 10 MG tablet Commonly known as: SINGULAIR Take 10 mg by mouth at bedtime.   multivitamin with minerals Tabs tablet Take 1 tablet by mouth daily.   nitroGLYCERIN 0.4 MG SL tablet Commonly known as: Nitrostat Place 1 tablet (0.4 mg total) under the tongue every 5  (five) minutes as needed for chest pain.   Ozempic (0.25 or 0.5 MG/DOSE) 2 MG/1.5ML Sopn Generic drug: Semaglutide(0.25 or 0.5MG /DOS) Inject 1 mg into the skin every Thursday.   pantoprazole 40 MG tablet Commonly known as: PROTONIX Take 40 mg by mouth daily.   polyethylene glycol 17 g packet Commonly known as: MIRALAX / GLYCOLAX Take 17 g by mouth daily as needed for mild constipation.   PRESCRIPTION MEDICATION Inhale into the lungs at bedtime. CPAP   torsemide 20 MG tablet Commonly known as: DEMADEX TAKE 2 TABLETS BY MOUTH ONCE DAILY  (STOP FUROSEMIDE)   valsartan 80  MG tablet Commonly known as: DIOVAN Take 80 mg by mouth daily.   zolpidem 10 MG tablet Commonly known as: AMBIEN Take 10 mg by mouth at bedtime as needed for sleep.        Allergies:  Allergies  Allergen Reactions   Lisinopril Cough    Family History  Problem Relation Age of Onset   Coronary artery disease Other        Family History   Breast cancer Neg Hx    Colon cancer Neg Hx    Prostate cancer Neg Hx    Pancreatic cancer Neg Hx     Social History:  reports that he quit smoking about 36 years ago. His smoking use included cigarettes. He started smoking about 61 years ago. He has a 39.00 pack-year smoking history. He quit smokeless tobacco use about 36 years ago.  His smokeless tobacco use included snuff and chew. He reports that he does not currently use alcohol. He reports that he does not use drugs.  ROS: A complete review of systems was performed.  All systems are negative except for pertinent findings as noted.  Physical Exam:  Vital signs in last 24 hours: There were no vitals taken for this visit. Constitutional:  Alert and oriented, No acute distress Cardiovascular: Regular rate  Respiratory: Normal respiratory effort GI: Abdomen is soft, nontender, nondistended, no abdominal masses. No CVAT.  Genitourinary: Normal male phallus, testes are descended bilaterally and non-tender and  without masses, scrotum is normal in appearance without lesions or masses, perineum is normal on inspection. Lymphatic: No lymphadenopathy Neurologic: Grossly intact, no focal deficits Psychiatric: Normal mood and affect  I have reviewed prior pt notes  I have reviewed notes from referring/previous physicians  I have reviewed urinalysis results  I have independently reviewed prior imaging  I have reviewed prior PSA results  I have reviewed prior urine culture   Impression/Assessment:  ***  Plan:  ***

## 2021-08-02 NOTE — Progress Notes (Signed)
? ?History of Present Illness: This is the first [facility] urology appointment for this man.  History as follows: ? ?PSA noted to be 5.5 in January of 2022. He was found to have a 1.5 cm nodule in the midline of the base of the prostate. He underwent a TRUS biopsy of the prostate on 4.8.22 that confirmed G 4+5 adenocarcinoma of the prostate with 11 out of 12 biopsy cores positive for malignancy.  ? ?Imaging studies:  ?CT abdomen/pelvis (08/06/20): Negative for malignancy.  ?Bone scan (08/13/20): Negative for malignancy.  ? ?TNM stage: cT2a N0 M0 (1.5 cm nodule at base in midline)  ?PSA: 5.5  ?Gleason score: 4+5=9 (GG 5)  ?Biopsy (07/24/20): 11/12 cores positive  ?Left: L lateral apex (50%, 4+5), L apex (50%, 4+4, PNI), L lateral mid (70%, 4+5), L mid (80%, 4+5), L lateral base (90%, 4+5), L base (90%, 4+5)  ?Right: R apex (5%, 4+5), R lateral apex (20%, 4+5), R mid (40%, 4+5), R base (95%, 4+5), R lateral base (50%, 4+5)  ?Prostate volume: 52.7 cc  ? ?IPSS is 4.  ?SHIM score is 18.  ? ?5.20.2022: LT ADT initiated w/ Firmagon 240 mg  ?7.8.2022: Leuprolide 30 mg administered.  ?9.12.2022: I 125 brachytherapy/[facility]  ? ?11.30.2022: He is here today for his leuprolide administration (30 mg). PSA <0.015.  ? ?12.15.2022: Completed 25 fractions of EBRT  ? ?4.3.2023: PSA <0.09, testosterone levels castrate.  He received a 37-monthLupron injection ? ?4.18.2023: [facility]  He received his Lupron injection last month.  He has at least two 476-monthnjections remaining. ? ?Past Medical History:  ?Diagnosis Date  ? Arthritis   ? Asthma   ? [facility]  ? Prostate  ? COPD (chronic obstructive pulmonary disease) ([facility]  ? Coronary atherosclerosis of native coronary artery 04/22/2001  ?    Cardiac Catheterization  ? COVID 10/2020  ? asymptomatic home test due to covid exposure  ? Diverticulitis   ? Dyspnea   ? WITH EXERTION  ? Encounter for long-term (current) use of insulin ([place]  ?  Esophageal reflux   ? Gout, unspecified   ? History of blood transfusion   ? 1 unit after 12-15-2020 surgery no problems with  ? History of kidney stones   ? Neuropathy   ? toes all the time  ? Old myocardial infarction 2013  ? Personal history of tobacco use, presenting hazards to health   ? Postsurgical percutaneous transluminal coronary angioplasty status   ? [place] cancer ([place]  ? Pure hypercholesterolemia   ? Skin cancer   ? Sleep apnea   ? USES CPAP  ? Type II or unspecified type diabetes mellitus without mention of complication, not stated as uncontrolled   ? TYPE 11  ? Unspecified essential hypertension   ? Wears glasses 12/24/2020  ? ? ?Past Surgical History:  ?Procedure Laterality Date  ? 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 Smashed in wench   ? CARDIAC CATHETERIZATION  2003  ? 70% proximal LAD (plaque rupture), 40 % mid. Left dominent  ? CARDIAC CATHETERIZATION  01/26/2011  ? LAD: Patent proximal stent. 90% calcified eccentic stenosis in med segment, mild LCX disease  ? CAROTID STENTS    ? CORONARY ANGIOPLASTY WITH STENT PLACEMENT  2003  ? Prox LAD: 3.0 X12 BMS  ? CORONARY ANGIOPLASTY WITH STENT PLACEMENT  01/26/2011  ? Mid LAD: 3.0 X15 mm Vision BMS. Complicated by a  jailed septal branch and periprocedural MI  ? EYE SURGERY Bilateral   ? laser for bleed  ? HEMORRHOID SURGERY    ? yrs ago per pt on 12-24-2020  ? HERNIA REPAIR    ? INGUINAL YRS AGO per pt on 12-24-2020  ? HUMERUS FRACTURE SURGERY Left   ? LEFT TOTAK KNEE REPLACEMENT  2015  ? IN [facility]  ? NASAL SINUS SURGERY    ? yrs ago per pt on 12-24-2020  ? ORIF FEMUR FRACTURE Left 03/19/2019  ? Procedure: OPEN REDUCTION INTERNAL FIXATION (ORIF) DISTAL FEMUR FRACTURE;  Surgeon: Altamese Johnson Siding, MD;  Location: [place];  Service: Orthopedics;  Laterality: Left;  ? ORIF FEMUR FRACTURE Left 08/06/2019  ? Procedure: REPAIR DISTAL FEMUR NONUNION WITH INFUSE AND ALLOGRAFT;  Surgeon: Altamese [place], MD;  Location: [place];  Service: Orthopedics;  Laterality:  Left;  ? RADIOACTIVE SEED IMPLANT N/A 12/28/2020  ? Procedure: RADIOACTIVE SEED IMPLANT/BRACHYTHERAPY IMPLANT;  Surgeon: Franchot Gallo, MD;  Location[facility];  Service: Urology;  Laterality: N/A;  ? SPACE OAR INSTILLATION N/A 12/28/2020  ? Procedure: SPACE OAR INSTILLATION;  Surgeon: Franchot Gallo, MD;  Location: [facility];  Service: Urology;  Laterality: N/A;  ? TOTAL KNEE REVISION Left 12/15/2020  ? Procedure: LEFT TOTAL KNEE REVISION WITH REMOVAL OF HARDWARE;  Surgeon: Paralee Cancel, MD;  [facility];  Service: Orthopedics;  Laterality: Left;  ? ? ?Home Medications:  ?Allergies as of 08/03/2021   ? ?   Reactions  ? Lisinopril Cough  ? ?  ? ?  ?Medication List  ?  ? ?  ? Accurate as of August 02, 2021 12:12 PM. If you have any questions, ask your nurse or doctor.  ?  ?  ? ?  ? ?acarbose 100 MG tablet ?Commonly known as: PRECOSE ?Take 100 mg by mouth 3 (three) times daily. ?  ?allopurinol 300 MG tablet ?Commonly known as: ZYLOPRIM ?Take 300 mg by mouth daily. ?  ?amLODipine 10 MG tablet ?Commonly known as: NORVASC ?Take 1 tablet (10 mg total) by mouth daily. ?What changed: when to take this ?  ?ascorbic acid 1000 MG tablet ?Commonly known as: VITAMIN C ?Take 1 tablet (1,000 mg total) by mouth daily. ?  ?aspirin EC 81 MG tablet ?Take 81 mg by mouth daily. Swallow whole. ?  ?atorvastatin 80 MG tablet ?Commonly known as: LIPITOR ?TAKE 1 TABLET (80 MG TOTAL) BY MOUTH DAILY. (STOP SIMVASTATIN) ?What changed: See the new instructions. ?  ?budesonide-formoterol 160-4.5 MCG/ACT inhaler ?Commonly known as: SYMBICORT ?Inhale 2 puffs into the lungs 2 (two) times daily. ?  ?carvedilol 25 MG tablet ?Commonly known as: COREG ?Take 25 mg by mouth 2 (two) times daily with a meal. ?  ?cholecalciferol 25 MCG (1000 UNIT) tablet ?Commonly known as: VITAMIN D3 ?Take 1,000 Units by mouth daily. ?  ?diclofenac 75 MG EC tablet ?Commonly known as: VOLTAREN ?Take 75 mg by mouth 2 (two) times  daily. ?  ?glipiZIDE 5 MG 24 hr tablet ?Commonly known as: GLUCOTROL XL ?Take 5 mg by mouth every morning. ?  ?isosorbide mononitrate 30 MG 24 hr tablet ?Commonly known as: IMDUR ?TAKE 1 & 1/2 (ONE & ONE-HALF) TABLETS BY MOUTH ONCE DAILY ?  ?levocetirizine 5 MG tablet ?Commonly known as: XYZAL ?Take 5 mg by mouth every evening. ?  ?magnesium oxide 400 MG tablet ?Commonly known as: MAG-OX ?Take 400 mg by mouth daily. ?  ?methocarbamol 500 MG tablet ?Commonly known as: ROBAXIN ?Take 1 tablet (  500 mg total) by mouth every 6 (six) hours as needed for muscle spasms. ?  ?montelukast 10 MG tablet ?Commonly known as: SINGULAIR ?Take 10 mg by mouth at bedtime. ?  ?multivitamin with minerals Tabs tablet ?Take 1 tablet by mouth daily. ?  ?nitroGLYCERIN 0.4 MG SL tablet ?Commonly known as: Nitrostat ?Place 1 tablet (0.4 mg total) under the tongue every 5 (five) minutes as needed for chest pain. ?  ?Ozempic (0.25 or 0.5 MG/DOSE) 2 MG/1.5ML Sopn ?Generic drug: Semaglutide(0.25 or 0.'5MG'$ /DOS) ?Inject 1 mg into the skin every Thursday. ?  ?pantoprazole 40 MG tablet ?Commonly known as: PROTONIX ?Take 40 mg by mouth daily. ?  ?polyethylene glycol 17 g packet ?Commonly known as: MIRALAX / GLYCOLAX ?Take 17 g by mouth daily as needed for mild constipation. ?  ?PRESCRIPTION MEDICATION ?Inhale into the lungs at bedtime. CPAP ?  ?torsemide 20 MG tablet ?Commonly known as: DEMADEX ?TAKE 2 TABLETS BY MOUTH ONCE DAILY  (STOP FUROSEMIDE) ?  ?valsartan 80 MG tablet ?Commonly known as: DIOVAN ?Take 80 mg by mouth daily. ?  ?zolpidem 10 MG tablet ?Commonly known as: AMBIEN ?Take 10 mg by mouth at bedtime as needed for sleep. ?  ? ?  ? ? ?Allergies:  ?Allergies  ?Allergen Reactions  ? Lisinopril Cough  ? ? ?Family History  ?Problem Relation Age of Onset  ? Coronary artery disease Other   ?     Family History  ? Breast cancer Neg Hx   ? Colon cancer Neg Hx   ? Prostate cancer Neg Hx   ? Pancreatic cancer Neg Hx   ? ? ?Social History:  reports that  he quit smoking about 36 years ago. His smoking use included cigarettes. He started smoking about 62 years ago. He has a 39.00 pack-year smoking history. He quit smokeless tobacco use about 36 years a

## 2021-08-03 ENCOUNTER — Ambulatory Visit (INDEPENDENT_AMBULATORY_CARE_PROVIDER_SITE_OTHER): Payer: Medicare HMO | Admitting: Urology

## 2021-08-03 ENCOUNTER — Encounter: Payer: Self-pay | Admitting: Urology

## 2021-08-03 VITALS — BP 150/79 | HR 64 | Ht 69.0 in | Wt 250.0 lb

## 2021-08-03 DIAGNOSIS — C61 Malignant neoplasm of prostate: Secondary | ICD-10-CM | POA: Diagnosis not present

## 2021-08-03 LAB — URINALYSIS, ROUTINE W REFLEX MICROSCOPIC
Bilirubin, UA: NEGATIVE
Glucose, UA: NEGATIVE
Ketones, UA: NEGATIVE
Leukocytes,UA: NEGATIVE
Nitrite, UA: NEGATIVE
Protein,UA: NEGATIVE
RBC, UA: NEGATIVE
Specific Gravity, UA: 1.015 (ref 1.005–1.030)
Urobilinogen, Ur: 0.2 mg/dL (ref 0.2–1.0)
pH, UA: 6 (ref 5.0–7.5)

## 2021-12-07 ENCOUNTER — Ambulatory Visit: Payer: Medicare HMO | Admitting: Urology

## 2022-04-20 ENCOUNTER — Ambulatory Visit: Payer: Medicare HMO | Admitting: Nurse Practitioner

## 2022-04-21 ENCOUNTER — Ambulatory Visit: Payer: Medicare Other | Attending: Nurse Practitioner | Admitting: Nurse Practitioner

## 2022-04-21 ENCOUNTER — Encounter: Payer: Self-pay | Admitting: Nurse Practitioner

## 2022-04-21 VITALS — BP 118/60 | HR 70 | Ht 70.0 in | Wt 249.8 lb

## 2022-04-21 DIAGNOSIS — I10 Essential (primary) hypertension: Secondary | ICD-10-CM

## 2022-04-21 DIAGNOSIS — I340 Nonrheumatic mitral (valve) insufficiency: Secondary | ICD-10-CM

## 2022-04-21 DIAGNOSIS — I2510 Atherosclerotic heart disease of native coronary artery without angina pectoris: Secondary | ICD-10-CM

## 2022-04-21 DIAGNOSIS — R600 Localized edema: Secondary | ICD-10-CM | POA: Diagnosis not present

## 2022-04-21 DIAGNOSIS — E785 Hyperlipidemia, unspecified: Secondary | ICD-10-CM | POA: Diagnosis not present

## 2022-04-21 MED ORDER — ISOSORBIDE MONONITRATE ER 30 MG PO TB24: ORAL_TABLET | ORAL | 3 refills | Status: DC

## 2022-04-21 NOTE — Progress Notes (Signed)
Cardiology Office Note:    Date:  04/21/2022  ID:  [name], DOB 10/06/1945, MRN [id number]  PCP:  Richard Sos, MD   [place] [facility] Cardiologist:  Richard Dolly, MD     Referring MD: Richard Sos, MD   CC: Here for 1 year follow-up  History of Present Illness:    [name] is a 77 y.o. male with a hx of the following:  CAD HTN HLD COPD Hx of femur fx Prostate Cancer  Patient is a 77 y.o. male with PMH as mentioned above.   History of bare-metal stent to LAD in 2003.  Underwent cardiac catheterization 2012 that showed LM patent, LAD with proximal stent mild ISR, patent mid stent, mid LAD lesion 90%.  OM2 30 to 40%, left circumflex 30% proximal, and RCA small non-dom with no disease.  Received DES to LAD, septal branch was jailed.  EF was found to be 65%.  In 2015 underwent exercise Cardiolite, negative for ischemic changes, EF 58%, moderate size mild intensity partially reversible inferior defect thought to be soft tissue attenuation, cannot rule out RCA ischemia.  Was admitted to [facility] in 2016 with chest pain.  Lexi scan was negative for ischemia, normal EF.  Underwent cardiac catheterization at [facility] in 2016 that showed patent coronaries.  Echocardiogram in 2018 showed normal EF, grade 1 DD.  Repeat NST in 2018 and 2021 was normal, low risk.   Last seen by Dr. Carlyle [name] on April 26, 2021.  Was overall doing well from a cardiac perspective.  Was compliant with his medications.  No changes to medication therapy.  Was told to follow-up in 6 months.  Today he presents for overdue follow-up with his wife.  He states he is doing well. Denies any acute cardiac complaints or concerns.  Denies any chest pain, shortness of breath, palpitations, syncope, presyncope, dizziness, orthopnea, PND, worsening swelling or significant weight changes, acute bleeding, or claudication.  Does admit to occasional intermittent leg edema, improved with  leg elevation.  Denies any other questions or concerns today.  He is husband to [name], another patient of mine.  Past Medical History:  Diagnosis Date   Arthritis    Asthma    Cancer [facility])    Prostate   COPD (chronic obstructive pulmonary disease) ([place])    Coronary atherosclerosis of native coronary artery 04/22/2001      Cardiac Catheterization   COVID 10/2020   asymptomatic home test due to covid exposure   Diverticulitis    Dyspnea    WITH EXERTION   Encounter for long-term (current) use of insulin ([facility])    Esophageal reflux    Gout, unspecified    History of blood transfusion    1 unit after 12-15-2020 surgery no problems with   History of kidney stones    Neuropathy    toes all the time   Old myocardial infarction 2013   Personal history of tobacco use, presenting hazards to health    Postsurgical percutaneous transluminal coronary angioplasty status    Prostate cancer ([place])    Pure hypercholesterolemia    Skin cancer    Sleep apnea    USES CPAP   Type II or unspecified type diabetes mellitus without mention of complication, not stated as uncontrolled    TYPE 11   Unspecified essential hypertension    Wears glasses 12/24/2020    Past Surgical History:  Procedure Laterality Date   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 Left dominent   CARDIAC CATHETERIZATION  01/26/2011   LAD: Patent proximal stent. 90% calcified eccentic stenosis in med segment, mild LCX disease   CAROTID STENTS     CORONARY ANGIOPLASTY WITH STENT PLACEMENT  2003   Prox LAD: 3.0 X12 BMS   CORONARY ANGIOPLASTY WITH STENT PLACEMENT  01/26/2011   Mid LAD: 3.0 X15 mm Vision BMS. Complicated by a jailed septal branch and periprocedural MI   EYE SURGERY Bilateral    laser for bleed   HEMORRHOID SURGERY     yrs ago per pt on 12-24-2020   HERNIA REPAIR     INGUINAL YRS AGO per pt on  12-24-2020   HUMERUS FRACTURE SURGERY Left    LEFT TOTAK KNEE REPLACEMENT  2015   IN [facility]   NASAL SINUS SURGERY     yrs ago per pt on 12-24-2020   ORIF FEMUR FRACTURE Left 03/19/2019   Procedure: OPEN REDUCTION INTERNAL FIXATION (ORIF) DISTAL FEMUR FRACTURE;  Surgeon: Altamese Mansfield, MD;  Location: [place];  Service: Orthopedics;  Laterality: Left;   ORIF FEMUR FRACTURE Left 08/06/2019   Procedure: REPAIR DISTAL FEMUR NONUNION WITH INFUSE AND ALLOGRAFT;  Surgeon: Altamese [place], MD;  Location: [place];  Service: Orthopedics;  Laterality: Left;   RADIOACTIVE SEED IMPLANT N/A 12/28/2020   Procedure: RADIOACTIVE SEED IMPLANT/BRACHYTHERAPY IMPLANT;  Surgeon: Franchot Gallo, MD;  Location: [facility];  Service: Urology;  Laterality: N/A;   SPACE OAR INSTILLATION N/A 12/28/2020   Procedure: SPACE OAR INSTILLATION;  Surgeon: Franchot Gallo, MD;  Location: [facility];  Service: Urology;  Laterality: N/A;   TOTAL KNEE REVISION Left 12/15/2020   Procedure: LEFT TOTAL KNEE REVISION WITH REMOVAL OF HARDWARE;  Surgeon: Paralee Cancel, MD;  [facility];  Service: Orthopedics;  Laterality: Left;    Current Medications: Current Meds  Medication Sig   acarbose (PRECOSE) 100 MG tablet Take 100 mg by mouth 3 (three) times daily.   allopurinol (ZYLOPRIM) 300 MG tablet Take 300 mg by mouth daily.    amLODipine (NORVASC) 10 MG tablet Take 1 tablet (10 mg total) by mouth daily. (Patient taking differently: Take 10 mg by mouth at bedtime.)   aspirin EC 81 MG tablet Take 81 mg by mouth daily. Swallow whole.   atorvastatin (LIPITOR) 80 MG tablet TAKE 1 TABLET (80 MG TOTAL) BY MOUTH DAILY. (STOP SIMVASTATIN) (Patient taking differently: daily.)   budesonide-formoterol (SYMBICORT) 160-4.5 MCG/ACT inhaler Inhale 2 puffs into the lungs 2 (two) times daily.   carvedilol (COREG) 25 MG tablet Take 25 mg by mouth 2 (two) times daily with a meal.   cholecalciferol (VITAMIN D3) 25 MCG  (1000 UNIT) tablet Take 1,000 Units by mouth daily.   diclofenac (VOLTAREN) 75 MG EC tablet Take 75 mg by mouth 2 (two) times daily.   glipiZIDE (GLUCOTROL XL) 5 MG 24 hr tablet Take 5 mg by mouth every morning.   levocetirizine (XYZAL) 5 MG tablet Take 5 mg by mouth every evening.   magnesium oxide (MAG-OX) 400 MG tablet Take 400 mg by mouth daily.   methocarbamol (ROBAXIN) 500 MG tablet Take 1 tablet (500 mg total) by mouth every 6 (six) hours as needed for muscle spasms.   montelukast (SINGULAIR) 10 MG tablet Take 10 mg by mouth at bedtime.   Multiple Vitamin (MULTIVITAMIN WITH MINERALS) TABS tablet Take 1 tablet by mouth daily.  nitroGLYCERIN (NITROSTAT) 0.4 MG SL tablet Place 1 tablet (0.4 mg total) under the tongue every 5 (five) minutes as needed for chest pain.   OZEMPIC, 0.25 OR 0.5 MG/DOSE, 2 MG/1.5ML SOPN Inject 1 mg into the skin every Thursday.   pantoprazole (PROTONIX) 40 MG tablet Take 40 mg by mouth daily.   polyethylene glycol (MIRALAX / GLYCOLAX) 17 g packet Take 17 g by mouth daily as needed for mild constipation.   PRESCRIPTION MEDICATION Inhale into the lungs at bedtime. CPAP   torsemide (DEMADEX) 20 MG tablet TAKE 2 TABLETS BY MOUTH ONCE DAILY  (STOP FUROSEMIDE)   valsartan (DIOVAN) 80 MG tablet Take 80 mg by mouth daily.   vitamin C (VITAMIN C) 1000 MG tablet Take 1 tablet (1,000 mg total) by mouth daily.   zolpidem (AMBIEN) 10 MG tablet Take 10 mg by mouth at bedtime as needed for sleep.   [DISCONTINUED] isosorbide mononitrate (IMDUR) 30 MG 24 hr tablet TAKE 1 & 1/2 (ONE & ONE-HALF) TABLETS BY MOUTH ONCE DAILY     Allergies:   Lisinopril   Social History   Socioeconomic History   Marital status: Married    Spouse name: PATRICIA  ANN   Number of children: 2   Years of education: Not on file   Highest education level: Not on file  Occupational History   Occupation: SELF EMPLOYED    Comment: Building control surveyor  Tobacco Use   Smoking status: Former    Packs/day:  1.50    Years: 26.00    Total pack years: 39.00    Types: Cigarettes    Start date: 07/16/1959    Quit date: 04/18/1985    Years since quitting: 37.0   Smokeless tobacco: Former    Types: Snuff, Chew    Quit date: 04/18/1985  Vaping Use   Vaping Use: Never used  Substance and Sexual Activity   Alcohol use: Not Currently   Drug use: No   Sexual activity: Not Currently  Other Topics Concern   Not on file  Social History Narrative   1 OF 2 CHILDREN DECEASED   Social Determinants of Health   Financial Resource Strain: Not on file  Food Insecurity: Not on file  Transportation Needs: Not on file  Physical Activity: Not on file  Stress: Not on file  Social Connections: Not on file     Family History: The patient's family history includes Coronary artery disease in an other family member. There is no history of Breast cancer, Colon cancer, Prostate cancer, or Pancreatic cancer.  ROS:   Review of Systems  Constitutional: Negative.   HENT: Negative.    Eyes: Negative.   Respiratory: Negative.    Cardiovascular: Negative.   Gastrointestinal: Negative.   Genitourinary: Negative.   Musculoskeletal:  Positive for joint pain. Negative for back pain, falls, myalgias and neck pain.  Skin: Negative.   Neurological:  Positive for tingling. Negative for dizziness, tremors, sensory change, speech change, focal weakness, seizures, loss of consciousness, weakness and headaches.       Numbness/tingling of bilateral forearms, most likely from overuse of job.   Endo/Heme/Allergies: Negative.   Psychiatric/Behavioral: Negative.      Please see the history of present illness.    All other systems reviewed and are negative.  EKGs/Labs/Other Studies Reviewed:    The following studies were reviewed today:   EKG:  EKG is ordered today.  The ekg ordered today demonstrates normal sinus rhythm, 66 bpm, no acute ischemic changes.  Lexiscan on  04/28/2021:   Findings are consistent with ischemia.  The study is low risk.   No ST deviation was noted.   LV perfusion is abnormal. There is evidence of ischemia. Defect 1: There is a medium defect with mild reduction in uptake present in the apical to basal inferior location(s) that is partially reversible. There is normal wall motion in the defect area. Consistent with ischemia.   Left ventricular function is normal. End diastolic cavity size is mildly enlarged. End systolic cavity size is normal.   Inferior perfusion defect worse on stress images suggests ischemia Low risk study, as area of ischemia is small.  Echocardiogram on 01/25/2017: Study Conclusions   - Left ventricle: The cavity size was mildly dilated. Wall    thickness was normal. Systolic function was normal. The estimated    ejection fraction was in the range of 55% to 60%. Wall motion was    normal; there were no regional wall motion abnormalities. Doppler    parameters are consistent with abnormal left ventricular    relaxation (grade 1 diastolic dysfunction). Doppler parameters    are consistent with high ventricular filling pressure.  - Mitral valve: There was mild regurgitation.   Recent Labs: No results found for requested labs within last 365 days.  Recent Lipid Panel No results found for: "CHOL", "TRIG", "HDL", "CHOLHDL", "VLDL", "LDLCALC", "LDLDIRECT"   Risk Assessment/Calculations:    The 10-year ASCVD risk score (Arnett DK, et al., 2019) is: 41%   Values used to calculate the score:     Age: 54 years     Sex: Male     Is Non-Hispanic African American: No     Diabetic: Yes     Tobacco smoker: No     Systolic Blood Pressure: 035 mmHg     Is BP treated: Yes     HDL Cholesterol: 31 mg/dL     Total Cholesterol: 131 mg/dL       Physical Exam:    VS:  BP 118/60   Pulse 70   Ht '5\' 10"'$  (1.778 m)   Wt 249 lb 12.8 oz (113.3 kg)   SpO2 93%   BMI 35.84 kg/m     Wt Readings from Last 3 Encounters:  04/21/22 249 lb 12.8 oz (113.3 kg)  08/03/21 250 lb  (113.4 kg)  04/26/21 250 lb 6.4 oz (113.6 kg)    GEN: Obese, 77 y.o. male in no acute distress HEENT: Normal NECK: No JVD; No carotid bruits CARDIAC: S1/S2, RRR, no murmurs, rubs, gallops; 2+ pulses RESPIRATORY:  Clear to auscultation without rales, wheezing or rhonchi  MUSCULOSKELETAL:  No edema; No deformity  SKIN: Warm and dry NEUROLOGIC:  Alert and oriented x 3 PSYCHIATRIC:  Normal affect   ASSESSMENT:    1. Coronary artery disease involving native heart without angina pectoris, unspecified vessel or lesion type   2. Essential hypertension   3. Hyperlipidemia, unspecified hyperlipidemia type   4. Leg edema   5. Mitral valve insufficiency, unspecified etiology    PLAN:    In order of problems listed above:  CAD History of past stenting to LAD. Stable with no anginal symptoms. No indication for ischemic evaluation. Continue ASA, Carvedilol, Imdur, NG PRN, and valsartan. Will refill Imdur per his request. Heart healthy diet and regular cardiovascular exercise encouraged.   HTN BP 118/60. BP well controlled at home. Discussed to monitor BP at home at least 2 hours after medications and sitting for 5-10 minutes. Continue Carvedilol, amlodipine, and valsartan. Heart healthy  diet and regular cardiovascular exercise encouraged.   HLD Lipid panel 1 year ago was unremarkable. Due for repeat labs with PCP. PCP to manage. Continue atorvastatin. Heart healthy diet and regular cardiovascular exercise encouraged.   LE edema Intermittent leg edema after being on his feet, goes away with leg elevation. Continue torsemide. Low salt, heart healthy diet and regular cardiovascular exercise encouraged. Compression stockings encouraged.   5. Mitral regurgitation Echo in 2018 revealed mild mitral regurgitation. Asymptomatic. Recommend updating 2D echo at next office visit to monitor this.   6. Disposition: Follow up with Dr. Harl Bowie in 6 months or sooner if anything changes.      Medication  Adjustments/Labs and Tests Ordered: Current medicines are reviewed at length with the patient today.  Concerns regarding medicines are outlined above.  Orders Placed This Encounter  Procedures   EKG 12-Lead   Meds ordered this encounter  Medications   isosorbide mononitrate (IMDUR) 30 MG 24 hr tablet    Sig: TAKE 1 & 1/2 (ONE & ONE-HALF) TABLETS BY MOUTH ONCE DAILY    Dispense:  135 tablet    Refill:  3    Patient Instructions  Medication Instructions:  Imdur refilled today Continue all other medications.     Labwork: none  Testing/Procedures: none  Follow-Up: 6 months   Any Other Special Instructions Will Be Listed Below (If Applicable).   If you need a refill on your cardiac medications before your next appointment, please call your pharmacy.    Signed, Finis Bud, NP  04/23/2022 2:43 PM    [place]

## 2022-04-21 NOTE — Patient Instructions (Signed)
Medication Instructions:  Imdur refilled today Continue all other medications.     Labwork: none  Testing/Procedures: none  Follow-Up: 6 months   Any Other Special Instructions Will Be Listed Below (If Applicable).   If you need a refill on your cardiac medications before your next appointment, please call your pharmacy.

## 2022-06-18 ENCOUNTER — Other Ambulatory Visit: Payer: Self-pay | Admitting: Cardiology

## 2022-07-25 NOTE — Progress Notes (Addendum)
RN reached out to patient to assess any navigation needs,and remind him of upcoming urology follow up.   RN left message for call back.

## 2022-07-26 ENCOUNTER — Encounter: Payer: Self-pay | Admitting: Urology

## 2022-07-26 ENCOUNTER — Ambulatory Visit: Payer: Medicare Other | Admitting: Urology

## 2022-07-26 VITALS — BP 136/66 | HR 65

## 2022-07-26 DIAGNOSIS — Z79899 Other long term (current) drug therapy: Secondary | ICD-10-CM | POA: Diagnosis not present

## 2022-07-26 DIAGNOSIS — C61 Malignant neoplasm of prostate: Secondary | ICD-10-CM

## 2022-07-26 LAB — URINALYSIS, ROUTINE W REFLEX MICROSCOPIC
Bilirubin, UA: NEGATIVE
Ketones, UA: NEGATIVE
Leukocytes,UA: NEGATIVE
Nitrite, UA: NEGATIVE
Protein,UA: NEGATIVE
RBC, UA: NEGATIVE
Specific Gravity, UA: 1.015 (ref 1.005–1.030)
Urobilinogen, Ur: 0.2 mg/dL (ref 0.2–1.0)
pH, UA: 5.5 (ref 5.0–7.5)

## 2022-07-26 NOTE — Progress Notes (Signed)
History of Present Illness: This 77 year old male returns for follow-up of adenocarcinoma the prostate, treated.  History as follows:   PSA noted to be 5.5 in January of 2022. He was found to have a 1.5 cm nodule in the midline of the base of the prostate. He underwent a TRUS biopsy of the prostate on 4.8.22 that confirmed G 4+5 adenocarcinoma of the prostate with 11 out of 12 biopsy cores positive for malignancy.    Imaging studies:  CT abdomen/pelvis (08/06/20): Negative for malignancy.  Bone scan (08/13/20): Negative for malignancy.    TNM stage: cT2a N0 M0 (1.5 cm nodule at base in midline)  PSA: 5.5  Gleason score: 4+5=9 (GG 5)  Biopsy (07/24/20): 11/12 cores positive  Left: L lateral apex (50%, 4+5), L apex (50%, 4+4, PNI), L lateral mid (70%, 4+5), L mid (80%, 4+5), L lateral base (90%, 4+5), L base (90%, 4+5)  Right: R apex (5%, 4+5), R lateral apex (20%, 4+5), R mid (40%, 4+5), R base (95%, 4+5), R lateral base (50%, 4+5)  Prostate volume: 52.7 cc    IPSS is 4.  SHIM score is 18.    5.20.2022: LT ADT initiated w/ Firmagon 240 mg  7.8.2022: Leuprolide 30 mg administered.  9.12.2022: I 125 brachytherapy/SpaceOAR    11.30.2022: He is here today for his leuprolide administration (30 mg). PSA <0.015.    12.15.2022: Completed 25 fractions of EBRT    4.5.2023: PSA <0.09, testosterone levels castrate.  He received a 92-month Lupron injection  4.18.2023: He is here today for introduction to [facility] urology.  He received his Lupron injection last month.  He has at least two 22-month injections remaining.  4.9.2024: First visit in a year, despite having directions to come back for continuing androgen deprivation therapy. He has not had a PSA done since his last visit. No LUTS, no blood in urine.  Past Medical History:  Diagnosis Date   Arthritis    Asthma    Cancer [facility])    Prostate   COPD (chronic obstructive pulmonary disease) (HCC)    Coronary atherosclerosis of native  coronary artery 04/22/2001      Cardiac Catheterization   COVID 10/2020   asymptomatic home test due to covid exposure   Diverticulitis    Dyspnea    WITH EXERTION   Encounter for long-term (current) use of insulin (HCC)    Esophageal reflux    Gout, unspecified    History of blood transfusion    1 unit after 12-15-2020 surgery no problems with   History of kidney stones    Neuropathy    toes all the time   Old myocardial infarction 2013   Personal history of tobacco use, presenting hazards to health    Postsurgical percutaneous transluminal coronary angioplasty status    Prostate cancer (HCC)    Pure hypercholesterolemia    Skin cancer    Sleep apnea    USES CPAP   Type II or unspecified type diabetes mellitus without mention of complication, not stated as uncontrolled    TYPE 11   Unspecified essential hypertension    Wears glasses 12/24/2020    Past Surgical History:  Procedure Laterality Date   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 Left dominent   CARDIAC CATHETERIZATION  01/26/2011   LAD: Patent proximal stent. 90% calcified eccentic stenosis in med segment, mild LCX disease  CAROTID STENTS     CORONARY ANGIOPLASTY WITH STENT PLACEMENT  2003   Prox LAD: 3.0 X12 BMS   CORONARY ANGIOPLASTY WITH STENT PLACEMENT  01/26/2011   Mid LAD: 3.0 X15 mm Vision BMS. Complicated by a jailed septal branch and periprocedural MI   EYE SURGERY Bilateral    laser for bleed   HEMORRHOID SURGERY     yrs ago per pt on 12-24-2020   HERNIA REPAIR     INGUINAL YRS AGO per pt on 12-24-2020   HUMERUS FRACTURE SURGERY Left    LEFT TOTAK KNEE REPLACEMENT  2015   IN [facility]   NASAL SINUS SURGERY     yrs ago per pt on 12-24-2020   ORIF FEMUR FRACTURE Left 03/19/2019   Procedure: OPEN REDUCTION INTERNAL FIXATION (ORIF) DISTAL FEMUR FRACTURE;  Surgeon: Myrene Galas, MD;  Location: [facility] OR;   Service: Orthopedics;  Laterality: Left;   ORIF FEMUR FRACTURE Left 08/06/2019   Procedure: REPAIR DISTAL FEMUR NONUNION WITH INFUSE AND ALLOGRAFT;  Surgeon: Myrene Galas, MD;  Location: [facility] OR;  Service: Orthopedics;  Laterality: Left;   RADIOACTIVE SEED IMPLANT N/A 12/28/2020   Procedure: RADIOACTIVE SEED IMPLANT/BRACHYTHERAPY IMPLANT;  Surgeon: Marcine Matar, MD;  Location: [facility];  Service: Urology;  Laterality: N/A;   SPACE OAR INSTILLATION N/A 12/28/2020   Procedure: SPACE OAR INSTILLATION;  Surgeon: Marcine Matar, MD;  Location: [facility];  Service: Urology;  Laterality: N/A;   TOTAL KNEE REVISION Left 12/15/2020   Procedure: LEFT TOTAL KNEE REVISION WITH REMOVAL OF HARDWARE;  Surgeon: Durene Romans, MD;  Location: [facility];  Service: Orthopedics;  Laterality: Left;    Home Medications:  Allergies as of 07/26/2022       Reactions   Lisinopril Cough        Medication List        Accurate as of July 26, 2022  8:13 AM. If you have any questions, ask your nurse or doctor.          acarbose 100 MG tablet Commonly known as: PRECOSE Take 100 mg by mouth 3 (three) times daily.   allopurinol 300 MG tablet Commonly known as: ZYLOPRIM Take 300 mg by mouth daily.   amLODipine 10 MG tablet Commonly known as: NORVASC Take 1 tablet (10 mg total) by mouth daily. What changed: when to take this   ascorbic acid 1000 MG tablet Commonly known as: VITAMIN C Take 1 tablet (1,000 mg total) by mouth daily.   aspirin EC 81 MG tablet Take 81 mg by mouth daily. Swallow whole.   atorvastatin 80 MG tablet Commonly known as: LIPITOR TAKE 1 TABLET (80 MG TOTAL) BY MOUTH DAILY. (STOP SIMVASTATIN) What changed: See the new instructions.   budesonide-formoterol 160-4.5 MCG/ACT inhaler Commonly known as: SYMBICORT Inhale 2 puffs into the lungs 2 (two) times daily.   carvedilol 25 MG tablet Commonly known as: COREG Take 25 mg by mouth 2 (two)  times daily with a meal.   cholecalciferol 25 MCG (1000 UNIT) tablet Commonly known as: VITAMIN D3 Take 1,000 Units by mouth daily.   diclofenac 75 MG EC tablet Commonly known as: VOLTAREN Take 75 mg by mouth 2 (two) times daily.   glipiZIDE 5 MG 24 hr tablet Commonly known as: GLUCOTROL XL Take 5 mg by mouth every morning.   isosorbide mononitrate 30 MG 24 hr tablet Commonly known as: IMDUR TAKE 1 & 1/2 (ONE & ONE-HALF) TABLETS BY MOUTH ONCE DAILY   levocetirizine 5 MG  tablet Commonly known as: XYZAL Take 5 mg by mouth every evening.   magnesium oxide 400 MG tablet Commonly known as: MAG-OX Take 400 mg by mouth daily.   methocarbamol 500 MG tablet Commonly known as: ROBAXIN Take 1 tablet (500 mg total) by mouth every 6 (six) hours as needed for muscle spasms.   montelukast 10 MG tablet Commonly known as: SINGULAIR Take 10 mg by mouth at bedtime.   multivitamin with minerals Tabs tablet Take 1 tablet by mouth daily.   nitroGLYCERIN 0.4 MG SL tablet Commonly known as: Nitrostat Place 1 tablet (0.4 mg total) under the tongue every 5 (five) minutes as needed for chest pain.   Ozempic (0.25 or 0.5 MG/DOSE) 2 MG/1.5ML Sopn Generic drug: Semaglutide(0.25 or 0.5MG /DOS) Inject 1 mg into the skin every Thursday.   pantoprazole 40 MG tablet Commonly known as: PROTONIX Take 40 mg by mouth daily.   polyethylene glycol 17 g packet Commonly known as: MIRALAX / GLYCOLAX Take 17 g by mouth daily as needed for mild constipation.   PRESCRIPTION MEDICATION Inhale into the lungs at bedtime. CPAP   torsemide 20 MG tablet Commonly known as: DEMADEX TAKE 2 TABLETS BY MOUTH ONCE DAILY - STOP FUROSEMIDE   valsartan 80 MG tablet Commonly known as: DIOVAN Take 80 mg by mouth daily.   zolpidem 10 MG tablet Commonly known as: AMBIEN Take 10 mg by mouth at bedtime as needed for sleep.        Allergies:  Allergies  Allergen Reactions   Lisinopril Cough    Family History   Problem Relation Age of Onset   Coronary artery disease Other        Family History   Breast cancer Neg Hx    Colon cancer Neg Hx    Prostate cancer Neg Hx    Pancreatic cancer Neg Hx     Social History:  reports that he quit smoking about 37 years ago. His smoking use included cigarettes. He started smoking about 63 years ago. He has a 39.00 pack-year smoking history. He quit smokeless tobacco use about 37 years ago.  His smokeless tobacco use included snuff and chew. He reports that he does not currently use alcohol. He reports that he does not use drugs.  ROS: A complete review of systems was performed.  All systems are negative except for pertinent findings as noted.  Physical Exam:  Vital signs in last 24 hours: There were no vitals taken for this visit. Constitutional:  Alert and oriented, No acute distress Cardiovascular: Regular rate  Respiratory: Normal respiratory effort Neurologic: Grossly intact, no focal deficits Psychiatric: Normal mood and affect  I have reviewed prior pt notes  I have reviewed notes from referring/previous physicians  I have reviewed urinalysis results--clear  I have independently reviewed prior imaging--ultrasound results reviewed  I have reviewed prior PSA and pathology results  Prior testosterone level reviewed   Impression/Assessment:  Grade group 5 prostate cancer, status post combination radiotherapy (completed December/2022) with incomplete androgen deprivation therapy.  Last injection was given just at a year ago.  He has been lost to follow-up but seems to be doing well  Plan:  Appropriate laboratories drawn today  I will have him come back in 6 months for recheck

## 2022-07-26 NOTE — Progress Notes (Signed)
RN spoke with patient and confirmed he is aware of upcoming appointment with urology.   RN provided address and phone number and educated on importance of continuing with follow up's.  Verbalized understanding.

## 2022-07-27 LAB — TESTOSTERONE: Testosterone: 30 ng/dL — ABNORMAL LOW (ref 264–916)

## 2022-07-27 LAB — PSA: Prostate Specific Ag, Serum: <0.1 ng/mL (ref 0.0–4.0)

## 2022-08-01 ENCOUNTER — Telehealth: Payer: Self-pay

## 2022-08-01 NOTE — Telephone Encounter (Signed)
-----   Message from Marcine Matar, MD sent at 08/01/2022  9:53 AM EDT ----- Notify patient that PSA is still 0, good news.  Testosterone still quite low, so he does not need any more shots. ----- Message ----- From: Troy Sine, CMA Sent: 07/27/2022   7:40 AM EDT To: Marcine Matar, MD  Please review

## 2022-08-01 NOTE — Telephone Encounter (Signed)
Tried calling patient with no answer, left voice for return call.

## 2022-08-02 NOTE — Telephone Encounter (Signed)
Letter sent out making patient aware of results.

## 2022-08-02 NOTE — Telephone Encounter (Signed)
Tried calling patient with no answer. Left voice message for return call to office.

## 2022-08-02 NOTE — Telephone Encounter (Signed)
-----   Message from Stephen Dahlstedt, MD sent at 08/01/2022  9:53 AM EDT ----- Notify patient that PSA is still 0, good news.  Testosterone still quite low, so he does not need any more shots. ----- Message ----- From: Violet Cart R, CMA Sent: 07/27/2022   7:40 AM EDT To: Stephen Dahlstedt, MD  Please review  

## 2022-08-02 NOTE — Telephone Encounter (Signed)
-----   Message from Stephen Dahlstedt, MD sent at 08/01/2022  9:53 AM EDT ----- Notify patient that PSA is still 0, good news.  Testosterone still quite low, so he does not need any more shots. ----- Message ----- From: Cassandria Drew R, CMA Sent: 07/27/2022   7:40 AM EDT To: Stephen Dahlstedt, MD  Please review  

## 2022-10-24 ENCOUNTER — Telehealth: Payer: Self-pay | Admitting: Cardiology

## 2022-10-24 NOTE — Telephone Encounter (Signed)
Pt called stating "my bp top number has been high, bottom number has been low" He denies any other symptoms but states he just doesn't feel right. Please advise.

## 2022-10-24 NOTE — Telephone Encounter (Signed)
Patient stated he was seen at [facility] at provider there told him top number on his BP was too high & bottom number too low & was told to get in touch with [place].  Patient could not give me any BP readings and does not have BP monitor at home.  Suggested he purchase one & start logging BP & HR to bring to next visit with him.  States he will go to [place] and buy one this evening.  Also, looks like patient is due for Echo at upcoming visit.  He verbalized understanding.  Appointment scheduled.

## 2022-11-11 ENCOUNTER — Ambulatory Visit: Payer: Medicare Other | Attending: Nurse Practitioner | Admitting: Nurse Practitioner

## 2022-11-11 ENCOUNTER — Encounter: Payer: Self-pay | Admitting: Nurse Practitioner

## 2022-11-11 DIAGNOSIS — I10 Essential (primary) hypertension: Secondary | ICD-10-CM | POA: Diagnosis not present

## 2022-11-11 DIAGNOSIS — E785 Hyperlipidemia, unspecified: Secondary | ICD-10-CM | POA: Diagnosis not present

## 2022-11-11 DIAGNOSIS — I340 Nonrheumatic mitral (valve) insufficiency: Secondary | ICD-10-CM

## 2022-11-11 DIAGNOSIS — I2510 Atherosclerotic heart disease of native coronary artery without angina pectoris: Secondary | ICD-10-CM | POA: Diagnosis not present

## 2022-11-11 DIAGNOSIS — R600 Localized edema: Secondary | ICD-10-CM

## 2022-11-11 DIAGNOSIS — R079 Chest pain, unspecified: Secondary | ICD-10-CM

## 2022-11-11 MED ORDER — NITROGLYCERIN 0.4 MG SL SUBL: 0.4000 mg | SUBLINGUAL_TABLET | SUBLINGUAL | 3 refills | Status: DC | PRN

## 2022-11-11 NOTE — Patient Instructions (Addendum)
Medication Instructions:  Your physician recommends that you continue on your current medications as directed. Please refer to the Current Medication list given to you today.  Labwork: None  Testing/Procedures: None  Follow-Up: Your physician recommends that you schedule a follow-up appointment in: 3 Months with Philis Nettle   Any Other Special Instructions Will Be Listed Below (If Applicable).  If you need a refill on your cardiac medications before your next appointment, please call your pharmacy.

## 2022-11-11 NOTE — Progress Notes (Unsigned)
Cardiology Office Note:    Date:  11/11/2022 ID:  [name], DOB [birth year], MRN [id number] PCP:  Richard Benne, MD [facility] Providers Cardiologist:  Richard Rich, MD   Referring MD: Richard Benne, MD  CC: Here for scheduled follow-up  History of Present Illness:    [name] is a 77 y.o. male with a PMH of CAD, HTN, HLD, COPD, hx of femur fracture, and prostate cancer, who presents today for scheduled follow-up.   History of bare-metal stent to LAD in 2003.  Underwent cardiac catheterization 2012 that showed LM patent, LAD with proximal stent mild ISR, patent mid stent, mid LAD lesion 90%.  OM2 30 to 40%, left circumflex 30% proximal, and RCA small non-dom with no disease.  Received DES to LAD, septal branch was jailed.  EF was found to be 65%.  In 2015 underwent exercise Cardiolite, negative for ischemic changes, EF 58%, moderate size mild intensity partially reversible inferior defect thought to be soft tissue attenuation, cannot rule out RCA ischemia.  Was admitted to [facility] in 2016 with chest pain.  Lexiscan was negative, normal EF.  Underwent LHC at [facility] in 2016, showed patent coronaries.  TTE 2018 showed normal EF, grade 1 DD.  Repeat NST in 2018 and 2021 was normal, low risk.   Today he presents for scheduled follow-up.  Sadly, he shares with me that his wife passed away in 08-25-22 of this year.  They were married for over 50 years.  Overall he is doing well from a cardiac perspective.  He does admit to chest pain "every once in a while," and this does sound musculoskeletal in nature, as he describes this as soreness and has been stable over the past year, he states that Tylenol helps his chest pain.  He remains very active and does outside work regularly, typically notices this when performing outside work. Denies any shortness of breath, palpitations, syncope, presyncope, dizziness, orthopnea, PND, swelling or significant weight changes, acute bleeding, or  claudication.  Please see the history of present illness.    All other systems reviewed and are negative.  EKGs/Labs/Other Studies Reviewed:    The following studies were reviewed today:   EKG:  EKG is not ordered today.    Lexiscan on 04/28/2021:   Findings are consistent with ischemia. The study is low risk.   No ST deviation was noted.   LV perfusion is abnormal. There is evidence of ischemia. Defect 1: There is a medium defect with mild reduction in uptake present in the apical to basal inferior location(s) that is partially reversible. There is normal wall motion in the defect area. Consistent with ischemia.   Left ventricular function is normal. End diastolic cavity size is mildly enlarged. End systolic cavity size is normal.   Inferior perfusion defect worse on stress images suggests ischemia Low risk study, as area of ischemia is small.  Echocardiogram on 01/25/2017: Study Conclusions   - Left ventricle: The cavity size was mildly dilated. Wall    thickness was normal. Systolic function was normal. The estimated    ejection fraction was in the range of 55% to 60%. Wall motion was    normal; there were no regional wall motion abnormalities. Doppler    parameters are consistent with abnormal left ventricular    relaxation (grade 1 diastolic dysfunction). Doppler parameters    are consistent with high ventricular filling pressure.  - Mitral valve: There was mild regurgitation.   Risk Assessment/Calculations:  The 10-year ASCVD risk score (Arnett DK, et al., 2019) is: 43.3%   Values used to calculate the score:     Age: 52 years     Sex: Male     Is Non-Hispanic African American: No     Diabetic: Yes     Tobacco smoker: No     Systolic Blood Pressure: 118 mmHg     Is BP treated: Yes     HDL Cholesterol: 31 mg/dL     Total Cholesterol: 131 mg/dL  Physical Exam:    VS:  BP 118/64   Pulse 72   Ht 5\' 9"  (1.753 m)   Wt 245 lb 6.4 oz (111.3 kg)   SpO2 94%   BMI  36.24 kg/m     Wt Readings from Last 3 Encounters:  11/11/22 245 lb 6.4 oz (111.3 kg)  04/21/22 249 lb 12.8 oz (113.3 kg)  08/03/21 250 lb (113.4 kg)    GEN: Obese, 77 y.o. male in no acute distress HEENT: Normal NECK: No JVD; No carotid bruits CARDIAC: S1/S2, RRR, no murmurs, rubs, gallops; 2+ pulses RESPIRATORY:  Clear to auscultation without rales, wheezing or rhonchi  MUSCULOSKELETAL:  No edema; No deformity  SKIN: Warm and dry NEUROLOGIC:  Alert and oriented x 3 PSYCHIATRIC:  Normal affect   ASSESSMENT & PLAN:    In order of problems listed above:  CAD, chest pain of uncertain etiology History of past stenting to LAD. Does admit to some atypical symptoms of chest pain, some features of his description do sound MSK in nature. Recommended Tylenol. Will hold off on medication titration at this time. No indication for ischemic evaluation as most recent stress test from January 2023 reviewed with patient, and Dr. Wyline Mood stated "stress test looks fine." Study was found to be low risk. Continue ASA, Atorvastatin, Carvedilol, Imdur, NTG PRN, and valsartan. Will refill NTG. Heart healthy diet and regular cardiovascular exercise encouraged.   HTN BP stable. BP well controlled at home. Discussed to monitor BP at home at least 2 hours after medications and sitting for 5-10 minutes.  No medication changes at this time.  Heart healthy diet and regular cardiovascular exercise encouraged.   HLD Lipid panel 1 year ago was unremarkable. PCP to manage. Continue atorvastatin. Heart healthy diet and regular cardiovascular exercise encouraged. Plan to request labs at next OV as this is when he will be getting next labs with PCP.  LE edema Intermittent leg edema after being on his feet, goes away with leg elevation. No edema on exam today. Continue torsemide. Low salt, heart healthy diet and regular cardiovascular exercise encouraged. Compression stockings encouraged.   5. Mitral regurgitation Echo  in 2018 revealed mild mitral regurgitation. Asymptomatic. Recommend updating 2D echo at next office visit to monitor this.   6. Disposition: Follow up with Dr. Wyline Mood or APP in 3 months or sooner if anything changes.   Medication Adjustments/Labs and Tests Ordered: Current medicines are reviewed at length with the patient today.  Concerns regarding medicines are outlined above.  No orders of the defined types were placed in this encounter.  Meds ordered this encounter  Medications   nitroGLYCERIN (NITROSTAT) 0.4 MG SL tablet    Sig: Place 1 tablet (0.4 mg total) under the tongue every 5 (five) minutes as needed for chest pain.    Dispense:  25 tablet    Refill:  3    Patient Instructions  Medication Instructions:  Your physician recommends that you continue on your current medications  as directed. Please refer to the Current Medication list given to you today.  Labwork: None  Testing/Procedures: None  Follow-Up: Your physician recommends that you schedule a follow-up appointment in: 3 Months with [name]   Any Other Special Instructions Will Be Listed Below (If Applicable).  If you need a refill on your cardiac medications before your next appointment, please call your pharmacy.   Signed, Sharlene Dory, NP  11/14/2022 5:40 AM    [facility]

## 2023-01-26 NOTE — Progress Notes (Signed)
History of Present Illness: This 77 year old male returns for follow-up of adenocarcinoma the prostate, treated.  History as follows:   PSA noted to be 5.5 in January of 2022. He was found to have a 1.5 cm nodule in the midline of the base of the prostate. He underwent a TRUS biopsy of the prostate on 4.8.22 that confirmed G 4+5 adenocarcinoma of the prostate with 11 out of 12 biopsy cores positive for malignancy.    Imaging studies:  CT abdomen/pelvis (08/06/20): Negative for malignancy.  Bone scan (08/13/20): Negative for malignancy.    TNM stage: cT2a N0 M0 (1.5 cm nodule at base in midline)  PSA: 5.5  Gleason score: 4+5=9 (GG 5)  Biopsy (07/24/20): 11/12 cores positive  Left: L lateral apex (50%, 4+5), L apex (50%, 4+4, PNI), L lateral mid (70%, 4+5), L mid (80%, 4+5), L lateral base (90%, 4+5), L base (90%, 4+5)  Right: R apex (5%, 4+5), R lateral apex (20%, 4+5), R mid (40%, 4+5), R base (95%, 4+5), R lateral base (50%, 4+5)  Prostate volume: 52.7 cc    IPSS is 4.  SHIM score is 18.    5.20.2022: LT ADT initiated w/ Firmagon 240 mg  7.8.2022: Leuprolide 30 mg administered.  9.12.2022: I 125 brachytherapy/SpaceOAR    11.30.2022: He is here today for his leuprolide administration (30 mg). PSA <0.015.    12.15.2022: Completed 25 fractions of EBRT    4.5.2023: PSA <0.09, testosterone levels castrate.  He received a 59-month Lupron injection  4.9.2024: First visit in a year, despite having directions to come back for continuing androgen deprivation therapy. He has not had a PSA done since his last visit. No LUTS, no blood in urine.  10.15.2024: Here today for routine check.  His PSA at last visit was less than 0.1, testosterone level 30.  Since his visit 6 months ago he has been voiding well.  IPSS 11/3.  Does have nocturia x 4.  Does not limit sodium in his diet-eats at [employer] every day, drinks caffeinated beverages in the evening.  No blood in his urine or stool.   Past  Medical History:  Diagnosis Date   Arthritis    Asthma    Cancer [facility])    Prostate   COPD (chronic obstructive pulmonary disease) (HCC)    Coronary atherosclerosis of native coronary artery 04/22/2001      Cardiac Catheterization   COVID 10/2020   asymptomatic home test due to covid exposure   Diverticulitis    Dyspnea    WITH EXERTION   Encounter for long-term (current) use of insulin (HCC)    Esophageal reflux    Gout, unspecified    History of blood transfusion    1 unit after 12-15-2020 surgery no problems with   History of kidney stones    Neuropathy    toes all the time   Old myocardial infarction 2013   Personal history of tobacco use, presenting hazards to health    Postsurgical percutaneous transluminal coronary angioplasty status    Prostate cancer (HCC)    Pure hypercholesterolemia    Skin cancer    Sleep apnea    USES CPAP   Type II or unspecified type diabetes mellitus without mention of complication, not stated as uncontrolled    TYPE 11   Unspecified essential hypertension    Wears glasses 12/24/2020    Past Surgical History:  Procedure Laterality Date   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 Left dominent   CARDIAC CATHETERIZATION  01/26/2011   LAD: Patent proximal stent. 90% calcified eccentic stenosis in med segment, mild LCX disease   CAROTID STENTS     CORONARY ANGIOPLASTY WITH STENT PLACEMENT  2003   Prox LAD: 3.0 X12 BMS   CORONARY ANGIOPLASTY WITH STENT PLACEMENT  01/26/2011   Mid LAD: 3.0 X15 mm Vision BMS. Complicated by a jailed septal branch and periprocedural MI   EYE SURGERY Bilateral    laser for bleed   HEMORRHOID SURGERY     yrs ago per pt on 12-24-2020   HERNIA REPAIR     INGUINAL YRS AGO per pt on 12-24-2020   HUMERUS FRACTURE SURGERY Left    LEFT TOTAK KNEE REPLACEMENT  2015   [facility]   NASAL SINUS SURGERY     yrs ago per  pt on 12-24-2020   ORIF FEMUR FRACTURE Left 03/19/2019   Procedure: OPEN REDUCTION INTERNAL FIXATION (ORIF) DISTAL FEMUR FRACTURE;  Surgeon: Myrene Galas, MD;  Location: [facility] OR;  Service: Orthopedics;  Laterality: Left;   ORIF FEMUR FRACTURE Left 08/06/2019   Procedure: REPAIR DISTAL FEMUR NONUNION WITH INFUSE AND ALLOGRAFT;  Surgeon: Myrene Galas, MD;  Location: [facility] OR;  Service: Orthopedics;  Laterality: Left;   RADIOACTIVE SEED IMPLANT N/A 12/28/2020   Procedure: RADIOACTIVE SEED IMPLANT/BRACHYTHERAPY IMPLANT;  Surgeon: Marcine Matar, MD;  Location: [facility];  Service: Urology;  Laterality: N/A;   SPACE OAR INSTILLATION N/A 12/28/2020   Procedure: SPACE OAR INSTILLATION;  Surgeon: Marcine Matar, MD;  Location: [facility];  Service: Urology;  Laterality: N/A;   TOTAL KNEE REVISION Left 12/15/2020   Procedure: LEFT TOTAL KNEE REVISION WITH REMOVAL OF HARDWARE;  Surgeon: Durene Romans, MD;  Location: [facility];  Service: Orthopedics;  Laterality: Left;    Home Medications:  Allergies as of 01/31/2023       Reactions   Lisinopril Cough        Medication List        Accurate as of January 26, 2023  2:21 PM. If you have any questions, ask your nurse or doctor.          acarbose 100 MG tablet Commonly known as: PRECOSE Take 100 mg by mouth 3 (three) times daily.   allopurinol 300 MG tablet Commonly known as: ZYLOPRIM Take 300 mg by mouth daily.   amLODipine 10 MG tablet Commonly known as: NORVASC Take 1 tablet (10 mg total) by mouth daily. What changed: when to take this   ascorbic acid 1000 MG tablet Commonly known as: VITAMIN C Take 1 tablet (1,000 mg total) by mouth daily.   aspirin EC 81 MG tablet Take 81 mg by mouth daily. Swallow whole.   atorvastatin 80 MG tablet Commonly known as: LIPITOR TAKE 1 TABLET (80 MG TOTAL) BY MOUTH DAILY. (STOP SIMVASTATIN) What changed: See the new instructions.   budesonide-formoterol  160-4.5 MCG/ACT inhaler Commonly known as: SYMBICORT Inhale 2 puffs into the lungs 2 (two) times daily.   carvedilol 25 MG tablet Commonly known as: COREG Take 25 mg by mouth 2 (two) times daily with a meal.   cholecalciferol 25 MCG (1000 UNIT) tablet Commonly known as: VITAMIN D3 Take 1,000 Units by mouth daily.   diclofenac 75 MG EC tablet Commonly known as: VOLTAREN Take 75 mg by mouth 2 (two) times daily.   glipiZIDE 5 MG 24 hr tablet Commonly known  as: GLUCOTROL XL Take 5 mg by mouth every morning.   isosorbide mononitrate 30 MG 24 hr tablet Commonly known as: IMDUR TAKE 1 & 1/2 (ONE & ONE-HALF) TABLETS BY MOUTH ONCE DAILY   levocetirizine 5 MG tablet Commonly known as: XYZAL Take 5 mg by mouth every evening.   magnesium oxide 400 MG tablet Commonly known as: MAG-OX Take 400 mg by mouth daily.   methocarbamol 500 MG tablet Commonly known as: ROBAXIN Take 1 tablet (500 mg total) by mouth every 6 (six) hours as needed for muscle spasms.   montelukast 10 MG tablet Commonly known as: SINGULAIR Take 10 mg by mouth at bedtime.   multivitamin with minerals Tabs tablet Take 1 tablet by mouth daily.   nitroGLYCERIN 0.4 MG SL tablet Commonly known as: Nitrostat Place 1 tablet (0.4 mg total) under the tongue every 5 (five) minutes as needed for chest pain.   oxyCODONE-acetaminophen 5-325 MG tablet Commonly known as: PERCOCET/ROXICET Take 1 tablet by mouth 3 (three) times daily as needed.   Ozempic (0.25 or 0.5 MG/DOSE) 2 MG/1.5ML Sopn Generic drug: Semaglutide(0.25 or 0.5MG /DOS) Inject 1 mg into the skin every Thursday.   pantoprazole 40 MG tablet Commonly known as: PROTONIX Take 40 mg by mouth daily.   polyethylene glycol 17 g packet Commonly known as: MIRALAX / GLYCOLAX Take 17 g by mouth daily as needed for mild constipation.   PRESCRIPTION MEDICATION Inhale into the lungs at bedtime. CPAP   torsemide 20 MG tablet Commonly known as: DEMADEX TAKE 2  TABLETS BY MOUTH ONCE DAILY - STOP FUROSEMIDE   valsartan 80 MG tablet Commonly known as: DIOVAN Take 80 mg by mouth daily.   zolpidem 10 MG tablet Commonly known as: AMBIEN Take 10 mg by mouth at bedtime as needed for sleep.        Allergies:  Allergies  Allergen Reactions   Lisinopril Cough    Family History  Problem Relation Age of Onset   Coronary artery disease Other        Family History   Breast cancer Neg Hx    Colon cancer Neg Hx    Prostate cancer Neg Hx    Pancreatic cancer Neg Hx     Social History:  reports that he quit smoking about 37 years ago. His smoking use included cigarettes. He started smoking about 63 years ago. He has a 38.9 pack-year smoking history. He quit smokeless tobacco use about 37 years ago.  His smokeless tobacco use included snuff and chew. He reports that he does not currently use alcohol. He reports that he does not use drugs.  ROS: A complete review of systems was performed.  All systems are negative except for pertinent findings as noted.  Physical Exam:  Vital signs in last 24 hours: There were no vitals taken for this visit. Constitutional:  Alert and oriented, No acute distress Cardiovascular: Regular rate.  He does have 3+ pitting edema bilaterally Respiratory: Normal respiratory effort Neurologic: Grossly intact, no focal deficits Psychiatric: Normal mood and affect  I have reviewed prior pt note  I have reviewed urinalysis results--clear  I have independently reviewed prior imaging--ultrasound results reviewed  I have reviewed prior PSA and pathology results  Prior testosterone level reviewed  IPSS reviewed   Impression/Assessment:  Grade group 5 prostate cancer, status post combination radiotherapy (completed December/2022) with incomplete androgen deprivation therapy.  Despite this, his PSA last visit was undetectable, testosterone level basically castrate  Plan:  PSA and testosterone drawn today  I will  have him come back in 6 months for recheck

## 2023-01-31 ENCOUNTER — Encounter: Payer: Self-pay | Admitting: Urology

## 2023-01-31 ENCOUNTER — Ambulatory Visit: Payer: Medicare Other | Admitting: Urology

## 2023-01-31 VITALS — BP 130/66 | HR 63 | Ht 69.0 in | Wt 245.4 lb

## 2023-01-31 DIAGNOSIS — Z08 Encounter for follow-up examination after completed treatment for malignant neoplasm: Secondary | ICD-10-CM

## 2023-01-31 DIAGNOSIS — Z8546 Personal history of malignant neoplasm of prostate: Secondary | ICD-10-CM | POA: Diagnosis not present

## 2023-02-01 LAB — URINALYSIS, ROUTINE W REFLEX MICROSCOPIC
Bilirubin, UA: NEGATIVE
Glucose, UA: NEGATIVE
Ketones, UA: NEGATIVE
Leukocytes,UA: NEGATIVE
Nitrite, UA: NEGATIVE
Protein,UA: NEGATIVE
RBC, UA: NEGATIVE
Specific Gravity, UA: 1.03 (ref 1.005–1.030)
Urobilinogen, Ur: 1 mg/dL (ref 0.2–1.0)
pH, UA: 5.5 (ref 5.0–7.5)

## 2023-02-01 LAB — PSA: Prostate Specific Ag, Serum: <0.1 ng/mL (ref 0.0–4.0)

## 2023-02-06 ENCOUNTER — Telehealth: Payer: Self-pay

## 2023-02-06 NOTE — Telephone Encounter (Signed)
Tried calling patient with no answer, left vm for return call 

## 2023-02-06 NOTE — Telephone Encounter (Signed)
-----   Message from Bertram Millard Dahlstedt sent at 02/03/2023 11:40 AM EDT ----- Notify pt psa still 0 ----- Message ----- From: Nell Range Lab Results In Sent: 02/01/2023   5:38 AM EDT To: Marcine Matar, MD

## 2023-02-07 NOTE — Telephone Encounter (Signed)
Tried calling patient with no answer, left vm for return call 

## 2023-02-07 NOTE — Telephone Encounter (Signed)
-----   Message from Bertram Millard Dahlstedt sent at 02/03/2023 11:40 AM EDT ----- Notify pt psa still 0 ----- Message ----- From: Nell Range Lab Results In Sent: 02/01/2023   5:38 AM EDT To: Marcine Matar, MD

## 2023-02-08 NOTE — Telephone Encounter (Signed)
Patient left a voice message 02-08-2023.  Returned missed call.  Please return call.

## 2023-02-09 ENCOUNTER — Encounter: Payer: Self-pay | Admitting: Nurse Practitioner

## 2023-02-09 ENCOUNTER — Ambulatory Visit: Payer: Medicare Other | Attending: Nurse Practitioner | Admitting: Nurse Practitioner

## 2023-02-09 VITALS — BP 130/62 | HR 69 | Ht 70.0 in | Wt 238.6 lb

## 2023-02-09 DIAGNOSIS — I2510 Atherosclerotic heart disease of native coronary artery without angina pectoris: Secondary | ICD-10-CM | POA: Diagnosis not present

## 2023-02-09 DIAGNOSIS — E785 Hyperlipidemia, unspecified: Secondary | ICD-10-CM | POA: Diagnosis not present

## 2023-02-09 DIAGNOSIS — I10 Essential (primary) hypertension: Secondary | ICD-10-CM | POA: Diagnosis not present

## 2023-02-09 DIAGNOSIS — R0609 Other forms of dyspnea: Secondary | ICD-10-CM

## 2023-02-09 DIAGNOSIS — I340 Nonrheumatic mitral (valve) insufficiency: Secondary | ICD-10-CM

## 2023-02-09 DIAGNOSIS — R0600 Dyspnea, unspecified: Secondary | ICD-10-CM

## 2023-02-09 DIAGNOSIS — Z01810 Encounter for preprocedural cardiovascular examination: Secondary | ICD-10-CM

## 2023-02-09 NOTE — Telephone Encounter (Signed)
Patient called again today to get the results from his lab work , call him on his cell phone

## 2023-02-09 NOTE — Progress Notes (Signed)
Cardiology Office Note:    Date: 02/09/2023 ID:  [name], DOB 05-07-1945, MRN [id number] PCP:  Elise Benne, MD [facility] Providers Cardiologist:  Dina Rich, MD   Referring MD: Elise Benne, MD  CC: Here for scheduled follow-up  History of Present Illness:    [name] is a 77 y.o. male with a PMH of CAD, HTN, HLD, COPD, hx of femur fracture, and prostate cancer, who presents today for scheduled follow-up.   History of bare-metal stent to LAD in 2003.  Underwent cardiac catheterization 2012 that showed LM patent, LAD with proximal stent mild ISR, patent mid stent, mid LAD lesion 90%.  OM2 30 to 40%, left circumflex 30% proximal, and RCA small non-dom with no disease.  Received DES to LAD, septal branch was jailed.  EF was found to be 65%.  In 2015 underwent exercise Cardiolite, negative for ischemic changes, EF 58%, moderate size mild intensity partially reversible inferior defect thought to be soft tissue attenuation, cannot rule out RCA ischemia.  Was admitted to [facility] in 2016 with chest pain.  Lexiscan was negative, normal EF.  Underwent LHC at [facility] in 2016, showed patent coronaries.  TTE 2018 showed normal EF, grade 1 DD.  Repeat NST in 2018 and 2021 was normal, low risk.   11/11/2022 - Today he presents for scheduled follow-up.  Sadly, he shares with me that his wife passed away in 10-03-22 of this year.  They were married for over 50 years.  Overall he is doing well from a cardiac perspective.  He does admit to chest pain "every once in a while," and this does sound musculoskeletal in nature, as he describes this as soreness and has been stable over the past year, he states that Tylenol helps his chest pain.  He remains very active and does outside work regularly, typically notices this when performing outside work. Denies any shortness of breath, palpitations, syncope, presyncope, dizziness, orthopnea, PND, swelling or significant weight changes, acute  bleeding, or claudication.  02/09/2023 - Doing well. Says he is scheduled for a colonoscopy in early December with [place], states he has had carotid ultrasound performed at [facility] as well. Denies any chest pain, palpitations, syncope, presyncope, dizziness, orthopnea, PND, swelling or significant weight changes, acute bleeding, or claudication. Does admit to some dyspnea on exertion, particularly when going uphill.   Please see the history of present illness.    All other systems reviewed and are negative.  EKGs/Labs/Other Studies Reviewed:    The following studies were reviewed today:   EKG:  EKG is not ordered today.    Lexiscan on 04/28/2021:   Findings are consistent with ischemia. The study is low risk.   No ST deviation was noted.   LV perfusion is abnormal. There is evidence of ischemia. Defect 1: There is a medium defect with mild reduction in uptake present in the apical to basal inferior location(s) that is partially reversible. There is normal wall motion in the defect area. Consistent with ischemia.   Left ventricular function is normal. End diastolic cavity size is mildly enlarged. End systolic cavity size is normal.   Inferior perfusion defect worse on stress images suggests ischemia Low risk study, as area of ischemia is small.  Echocardiogram on 01/25/2017: Study Conclusions   - Left ventricle: The cavity size was mildly dilated. Wall    thickness was normal. Systolic function was normal. The estimated    ejection fraction was in the  range of 55% to 60%. Wall motion was    normal; there were no regional wall motion abnormalities. Doppler    parameters are consistent with abnormal left ventricular    relaxation (grade 1 diastolic dysfunction). Doppler parameters    are consistent with high ventricular filling pressure.  - Mitral valve: There was mild regurgitation.   Risk Assessment/Calculations:    The 10-year ASCVD risk score  (Arnett DK, et al., 2019) is: 49.1%   Values used to calculate the score:     Age: 53 years     Sex: Male     Is Non-Hispanic African American: No     Diabetic: Yes     Tobacco smoker: No     Systolic Blood Pressure: 130 mmHg     Is BP treated: Yes     HDL Cholesterol: 31 mg/dL     Total Cholesterol: 131 mg/dL  Physical Exam:    VS:  BP 130/62   Pulse 69   Ht 5\' 10"  (1.778 m)   Wt 238 lb 9.6 oz (108.2 kg)   SpO2 97%   BMI 34.24 kg/m     Wt Readings from Last 3 Encounters:  02/09/23 238 lb 9.6 oz (108.2 kg)  01/31/23 245 lb 6.4 oz (111.3 kg)  11/11/22 245 lb 6.4 oz (111.3 kg)    GEN: Obese, 77 y.o. male in no acute distress HEENT: Normal NECK: No JVD; No carotid bruits CARDIAC: S1/S2, RRR, no murmurs, rubs, gallops; 2+ pulses RESPIRATORY:  Clear to auscultation without rales, wheezing or rhonchi  MUSCULOSKELETAL:  No edema; No deformity  SKIN: Warm and dry NEUROLOGIC:  Alert and oriented x 3 PSYCHIATRIC:  Normal affect   ASSESSMENT & PLAN:    In order of problems listed above:  CAD, DOE History of past stenting to LAD. Stable with no anginal symptoms. No indication for ischemic evaluation. No indication for ischemic evaluation as most recent stress test from January 2023 reviewed with patient. Will update Echo due to DOE, noticed when walking uphill. Continue current medication regimen. Heart healthy diet and regular cardiovascular exercise encouraged.   HTN BP stable. BP well controlled at home. Discussed to monitor BP at home at least 2 hours after medications and sitting for 5-10 minutes.  No medication changes at this time.  Heart healthy diet and regular cardiovascular exercise encouraged.   HLD Recent LDL 56. PCP to manage. Continue atorvastatin. Heart healthy diet and regular cardiovascular exercise encouraged.   4. Mitral regurgitation, DOE Echo in 2018 revealed mild mitral regurgitation. Does admit to DOE as mentioned above. Updating 2D echo as mentioned  above.   5. Pre-operative cardiovascular risk assessment [name]'s perioperative risk of a major cardiac event is 0.9% according to the Revised Cardiac Risk Index (RCRI).  Therefore, he is at low risk for perioperative complications.   His functional capacity is excellent at 6.61 METs according to the Duke Activity Status Index (DASI). Recommendations: According to ACC/AHA guidelines, no further cardiovascular testing needed.  The patient may proceed to surgery at acceptable risk.  Does have DOE, and therefore will require Echo to be obtained prior to clearance since last Echo was obtained in 2018. Then will provide clearance.  Antiplatelet and/or Anticoagulation Recommendations: The patient should remain on Aspirin without interruption.    6. Disposition: Follow up with Dr. Wyline Mood or APP in 1 month or sooner if anything changes.   Medication Adjustments/Labs and Tests Ordered: Current medicines are reviewed at length with the patient today.  Concerns regarding medicines are outlined above.  Orders Placed This Encounter  Procedures   ECHOCARDIOGRAM COMPLETE   No orders of the defined types were placed in this encounter.   Patient Instructions  Medication Instructions:  Your physician recommends that you continue on your current medications as directed. Please refer to the Current Medication list given to you today.  Labwork: None   Testing/Procedures: Your physician has requested that you have an echocardiogram. Echocardiography is a painless test that uses sound waves to create images of your heart. It provides your doctor with information about the size and shape of your heart and how well your heart's chambers and valves are working. This procedure takes approximately one hour. There are no restrictions for this procedure. Please do NOT wear cologne, perfume, aftershave, or lotions (deodorant is allowed). Please arrive 15 minutes prior to your appointment  time.  Follow-Up: Your physician recommends that you schedule a follow-up appointment in: 1 Month   Any Other Special Instructions Will Be Listed Below (If Applicable).  If you need a refill on your cardiac medications before your next appointment, please call your pharmacy.   Signed, Sharlene Dory, NP

## 2023-02-09 NOTE — Telephone Encounter (Signed)
Patient made aware and voiced understanding.

## 2023-02-09 NOTE — Telephone Encounter (Signed)
Letter mailed informing pt of results.

## 2023-02-09 NOTE — Patient Instructions (Addendum)
Medication Instructions:  Your physician recommends that you continue on your current medications as directed. Please refer to the Current Medication list given to you today.  Labwork: None   Testing/Procedures: Your physician has requested that you have an echocardiogram. Echocardiography is a painless test that uses sound waves to create images of your heart. It provides your doctor with information about the size and shape of your heart and how well your heart's chambers and valves are working. This procedure takes approximately one hour. There are no restrictions for this procedure. Please do NOT wear cologne, perfume, aftershave, or lotions (deodorant is allowed). Please arrive 15 minutes prior to your appointment time.  Follow-Up: Your physician recommends that you schedule a follow-up appointment in: 1 Month   Any Other Special Instructions Will Be Listed Below (If Applicable).  If you need a refill on your cardiac medications before your next appointment, please call your pharmacy.

## 2023-02-23 ENCOUNTER — Ambulatory Visit: Payer: Medicare Other | Attending: Nurse Practitioner

## 2023-02-23 DIAGNOSIS — R0609 Other forms of dyspnea: Secondary | ICD-10-CM | POA: Diagnosis not present

## 2023-02-23 LAB — ECHOCARDIOGRAM COMPLETE
AR max vel: 3.33 cm2
AV Area VTI: 3.08 cm2
AV Area mean vel: 3.47 cm2
AV Mean grad: 4 mm[Hg]
AV Peak grad: 6.6 mm[Hg]
Ao pk vel: 1.28 m/s
Area-P 1/2: 2.66 cm2
Calc EF: 63.7 %
MV VTI: 2.45 cm2
S' Lateral: 3.8 cm
Single Plane A2C EF: 50 %
Single Plane A4C EF: 72.7 %

## 2023-03-06 ENCOUNTER — Ambulatory Visit: Payer: Medicare Other | Attending: Nurse Practitioner | Admitting: Nurse Practitioner

## 2023-03-06 ENCOUNTER — Encounter: Payer: Self-pay | Admitting: Nurse Practitioner

## 2023-03-06 VITALS — BP 124/68 | HR 68 | Ht 70.0 in | Wt 238.4 lb

## 2023-03-06 DIAGNOSIS — I10 Essential (primary) hypertension: Secondary | ICD-10-CM | POA: Diagnosis not present

## 2023-03-06 DIAGNOSIS — I2510 Atherosclerotic heart disease of native coronary artery without angina pectoris: Secondary | ICD-10-CM

## 2023-03-06 DIAGNOSIS — J449 Chronic obstructive pulmonary disease, unspecified: Secondary | ICD-10-CM

## 2023-03-06 DIAGNOSIS — R0609 Other forms of dyspnea: Secondary | ICD-10-CM

## 2023-03-06 DIAGNOSIS — E785 Hyperlipidemia, unspecified: Secondary | ICD-10-CM

## 2023-03-06 DIAGNOSIS — Z01810 Encounter for preprocedural cardiovascular examination: Secondary | ICD-10-CM

## 2023-03-06 NOTE — Progress Notes (Unsigned)
Cardiology Office Note:   Date: 03/06/2023 ID:  [name], DOB 07-16-1945, MRN [id number] PCP:  Elise Benne, MD [facility] Providers Cardiologist:  Dina Rich, MD   Referring MD: Elise Benne, MD  CC: Here for scheduled follow-up  History of Present Illness:    [name] is a 77 y.o. male with a PMH of CAD, HTN, HLD, COPD, hx of femur fracture, and prostate cancer, who presents today for scheduled follow-up.   History of bare-metal stent to LAD in 2003.  Underwent cardiac catheterization 2012 that showed LM patent, LAD with proximal stent mild ISR, patent mid stent, mid LAD lesion 90%.  OM2 30 to 40%, left circumflex 30% proximal, and RCA small non-dom with no disease.  Received DES to LAD, septal branch was jailed.  EF was found to be 65%.  In 2015 underwent exercise Cardiolite, negative for ischemic changes, EF 58%, moderate size mild intensity partially reversible inferior defect thought to be soft tissue attenuation, cannot rule out RCA ischemia.  Was admitted to [facility] in 2016 with chest pain.  Lexiscan was negative, normal EF.  Underwent LHC at [facility] in 2016, showed patent coronaries.  TTE 2018 showed normal EF, grade 1 DD.  Repeat NST in 2018 and 2021 was normal, low risk.   02/09/2023 - Doing well. Says he is scheduled for a colonoscopy in early December with [facility], states he has had carotid ultrasound performed at [facility] as well. Denies any chest pain, palpitations, syncope, presyncope, dizziness, orthopnea, PND, swelling or significant weight changes, acute bleeding, or claudication. Does admit to some dyspnea on exertion, particularly when going uphill.   03/06/2023 - He presents today for follow-up.  He is doing well.  Denies any recent chest pain. Continues to admit to stable DOE, relates this to his COPD. Denies any chest pain, palpitations, syncope, presyncope, dizziness, orthopnea, PND, swelling or significant  weight changes, acute bleeding, or claudication.  Please see the history of present illness.    All other systems reviewed and are negative.  EKGs/Labs/Other Studies Reviewed:    The following studies were reviewed today:   EKG:  EKG is not ordered today.    Echo 02/2023:  1. Left ventricular ejection fraction, by estimation, is 60 to 65%. The  left ventricle has normal function. The left ventricle has no regional  wall motion abnormalities. Left ventricular diastolic parameters are  consistent with Grade I diastolic  dysfunction (impaired relaxation). Normal LVEDP.   2. Right ventricular systolic function is normal. The right ventricular  size is normal. Tricuspid regurgitation signal is inadequate for assessing  PA pressure.   3. The mitral valve is normal in structure. No evidence of mitral valve  regurgitation. No evidence of mitral stenosis.   4. The aortic valve is tricuspid. There is mild calcification of the  aortic valve. Aortic valve regurgitation is not visualized. No aortic  stenosis is present.   Comparison(s): No significant change from prior study.  Lexiscan on 04/28/2021:   Findings are consistent with ischemia. The study is low risk.   No ST deviation was noted.   LV perfusion is abnormal. There is evidence of ischemia. Defect 1: There is a medium defect with mild reduction in uptake present in the apical to basal inferior location(s) that is partially reversible. There is normal wall motion in the defect area. Consistent with ischemia.   Left ventricular function is normal. End diastolic cavity size is mildly enlarged. End systolic  cavity size is normal.   Inferior perfusion defect worse on stress images suggests ischemia Low risk study, as area of ischemia is small.  Echocardiogram on 01/25/2017: Study Conclusions   - Left ventricle: The cavity size was mildly dilated. Wall    thickness was normal. Systolic function was normal. The estimated    ejection  fraction was in the range of 55% to 60%. Wall motion was    normal; there were no regional wall motion abnormalities. Doppler    parameters are consistent with abnormal left ventricular    relaxation (grade 1 diastolic dysfunction). Doppler parameters    are consistent with high ventricular filling pressure.  - Mitral valve: There was mild regurgitation.   Risk Assessment/Calculations:    The 10-year ASCVD risk score (Arnett DK, et al., 2019) is: 46.3%   Values used to calculate the score:     Age: 51 years     Sex: Male     Is Non-Hispanic African American: No     Diabetic: Yes     Tobacco smoker: No     Systolic Blood Pressure: 124 mmHg     Is BP treated: Yes     HDL Cholesterol: 31 mg/dL     Total Cholesterol: 131 mg/dL  Physical Exam:    VS:  BP 124/68   Pulse 68   Ht 5\' 10"  (1.778 m)   Wt 238 lb 6.4 oz (108.1 kg)   SpO2 96%   BMI 34.21 kg/m     Wt Readings from Last 3 Encounters:  03/06/23 238 lb 6.4 oz (108.1 kg)  02/09/23 238 lb 9.6 oz (108.2 kg)  01/31/23 245 lb 6.4 oz (111.3 kg)    GEN: Obese, 77 y.o. male in no acute distress HEENT: Normal NECK: No JVD; No carotid bruits CARDIAC: S1/S2, RRR, no murmurs, rubs, gallops; 2+ pulses RESPIRATORY:  Clear to auscultation without rales, wheezing or rhonchi  MUSCULOSKELETAL:  No edema; No deformity  SKIN: Warm and dry NEUROLOGIC:  Alert and oriented x 3 PSYCHIATRIC:  Normal affect   ASSESSMENT & PLAN:    In order of problems listed above:  CAD, DOE History of past stenting to LAD. Stable with no anginal symptoms. No indication for ischemic evaluation. No indication for ischemic evaluation as most recent stress test from January 2023 reviewed with patient. Chronic, stable DOE r/t his COPD. Continue current medication regimen. Heart healthy diet and regular cardiovascular exercise encouraged. Care and ED precautions discussed.   HTN BP stable. BP well controlled at home. Discussed to monitor BP at home at least 2  hours after medications and sitting for 5-10 minutes.  No medication changes at this time.  Heart healthy diet and regular cardiovascular exercise encouraged.   HLD Past LDL 56. PCP to manage. Continue atorvastatin. Heart healthy diet and regular cardiovascular exercise encouraged.   4. COPD, DOE Denies any shortness of breath or acute exacerbations. Notes chronic, stable DOE. Recent Echo benign. No medication changes at this time. Continue to follow with PCP.  5. Pre-operative cardiovascular risk assessment Mr. Stratmann's perioperative risk of a major cardiac event is 0.9% according to the Revised Cardiac Risk Index (RCRI).  Therefore, he is at low risk for perioperative complications.   His functional capacity is excellent at 6.61 METs according to the Duke Activity Status Index (DASI). Recommendations: According to ACC/AHA guidelines, no further cardiovascular testing needed.  The patient may proceed to surgery at acceptable risk.  Antiplatelet and/or Anticoagulation Recommendations: The patient should remain on  Aspirin without interruption.    6. Disposition: Follow up with Dr. Wyline Mood or APP in 3-4 months or sooner if anything changes.   Medication Adjustments/Labs and Tests Ordered: Current medicines are reviewed at length with the patient today.  Concerns regarding medicines are outlined above.  No orders of the defined types were placed in this encounter.  No orders of the defined types were placed in this encounter.   Patient Instructions  Medication Instructions:  Your physician recommends that you continue on your current medications as directed. Please refer to the Current Medication list given to you today.  Labwork: None   Testing/Procedures: None   Follow-Up: Your physician recommends that you schedule a follow-up appointment in: 3-4 months   Any Other Special Instructions Will Be Listed Below (If Applicable).  If you need a refill on your cardiac medications  before your next appointment, please call your pharmacy.   Signed, Sharlene Dory, NP

## 2023-03-06 NOTE — Patient Instructions (Addendum)
Medication Instructions:  Your physician recommends that you continue on your current medications as directed. Please refer to the Current Medication list given to you today.  Labwork: None   Testing/Procedures: None   Follow-Up: Your physician recommends that you schedule a follow-up appointment in: 3-47 months   Any Other Special Instructions Will Be Listed Below (If Applicable).  If you need a refill on your cardiac medications before your next appointment, please call your pharmacy.

## 2023-03-13 ENCOUNTER — Telehealth: Payer: Self-pay | Admitting: *Deleted

## 2023-03-13 NOTE — Telephone Encounter (Signed)
   Pre-operative Risk Assessment    Patient Name: [name]  DOB: 02-04-1946 MRN: [id number]  DATE OF LAST VISIT: 03/06/23 Sharlene Dory, NP DATE OF NEXT VISIT: 06/20/23 Sharlene Dory, NP    Request for Surgical Clearance    Procedure:   COLONOSCOPY  Date of Surgery:  Clearance TBD                                 Surgeon:  DR. Eula Listen Group or Practice Name:  [facility]  Phone number:  [phone] Fax number:  [phone]   Type of Clearance Requested:   - Medical ; NONE INDICATED TO BE HELD   Type of Anesthesia:  Not Indicated   Additional requests/questions:  Please fax a copy of RECENT OV NOTE AND ECHO to the surgeon's office.  Elpidio Anis   03/13/2023, 5:51 PM

## 2023-03-14 NOTE — Telephone Encounter (Signed)
   Patient Name: [name]  DOB: 30-Oct-1945 MRN: [id number]  Primary Cardiologist: Dina Rich, MD  Chart reviewed as part of pre-operative protocol coverage. Pre-op clearance already addressed by colleagues in earlier phone notes. To summarize recommendations:  - Pre-operative cardiovascular risk assessment [name]'s perioperative risk of a major cardiac event is 0.9% according to the Revised Cardiac Risk Index (RCRI).  Therefore, he is at low risk for perioperative complications.   His functional capacity is excellent at 6.61 METs according to the Duke Activity Status Index (DASI). Recommendations: According to ACC/AHA guidelines, no further cardiovascular testing needed.  The patient may proceed to surgery at acceptable risk.  Antiplatelet and/or Anticoagulation Recommendations: The patient should remain on Aspirin without interruption.   Patient was cleared 03/06/2023 by Sharlene Dory, NP  Will route this bundled recommendation to requesting provider via Epic fax function and remove from pre-op pool. Please call with questions.  Sharlene Dory, PA-C 03/14/2023, 4:47 PM

## 2023-04-18 ENCOUNTER — Other Ambulatory Visit: Payer: Self-pay | Admitting: Otolaryngology

## 2023-04-18 DIAGNOSIS — K219 Gastro-esophageal reflux disease without esophagitis: Secondary | ICD-10-CM

## 2023-04-18 DIAGNOSIS — R1314 Dysphagia, pharyngoesophageal phase: Secondary | ICD-10-CM

## 2023-04-26 ENCOUNTER — Other Ambulatory Visit (HOSPITAL_COMMUNITY): Payer: Self-pay | Admitting: Otolaryngology

## 2023-04-26 DIAGNOSIS — R1314 Dysphagia, pharyngoesophageal phase: Secondary | ICD-10-CM

## 2023-04-26 DIAGNOSIS — K219 Gastro-esophageal reflux disease without esophagitis: Secondary | ICD-10-CM

## 2023-04-26 NOTE — Progress Notes (Signed)
 [place] Esophagram (Order [id number])  has been faxed to [facility].

## 2023-05-01 ENCOUNTER — Ambulatory Visit (HOSPITAL_COMMUNITY)
Admission: RE | Admit: 2023-05-01 | Discharge: 2023-05-01 | Disposition: A | Discharge status: Home / Self Care | Payer: Medicare Other | Source: Ambulatory Visit | Attending: Otolaryngology | Admitting: Otolaryngology

## 2023-05-01 ENCOUNTER — Other Ambulatory Visit (HOSPITAL_COMMUNITY): Payer: Self-pay | Admitting: Otolaryngology

## 2023-05-01 DIAGNOSIS — R1314 Dysphagia, pharyngoesophageal phase: Secondary | ICD-10-CM

## 2023-05-01 DIAGNOSIS — K219 Gastro-esophageal reflux disease without esophagitis: Secondary | ICD-10-CM

## 2023-06-20 ENCOUNTER — Ambulatory Visit: Payer: Medicare Other | Attending: Nurse Practitioner | Admitting: Nurse Practitioner

## 2023-06-20 ENCOUNTER — Encounter: Payer: Self-pay | Admitting: Nurse Practitioner

## 2023-06-20 NOTE — Progress Notes (Deleted)
 Cardiology Office Note:   Date: 03/06/2023 ID:  [name], DOB 1945-10-18, MRN [id number] PCP:  Elise Benne, MD [facility] Providers Cardiologist:  Dina Rich, MD   Referring MD: Elise Benne, MD  CC: Here for scheduled follow-up  History of Present Illness:    [name] is a 78 y.o. male with a PMH of CAD, HTN, HLD, COPD, hx of femur fracture, and prostate cancer, who presents today for scheduled follow-up.   History of bare-metal stent to LAD in 2003.  Underwent cardiac catheterization 2012 that showed LM patent, LAD with proximal stent mild ISR, patent mid stent, mid LAD lesion 90%.  OM2 30 to 40%, left circumflex 30% proximal, and RCA small non-dom with no disease.  Received DES to LAD, septal branch was jailed.  EF was found to be 65%.  In 2015 underwent exercise Cardiolite, negative for ischemic changes, EF 58%, moderate size mild intensity partially reversible inferior defect thought to be soft tissue attenuation, cannot rule out RCA ischemia.  Was admitted to [facility] in 2016 with chest pain.  Lexiscan was negative, normal EF.  Underwent LHC at [facility] in 2016, showed patent coronaries.  TTE 2018 showed normal EF, grade 1 DD.  Repeat NST in 2018 and 2021 was normal, low risk.   02/09/2023 - Doing well. Says he is scheduled for a colonoscopy in early December with [facility], states he has had carotid ultrasound performed at [facility] as well. Denies any chest pain, palpitations, syncope, presyncope, dizziness, orthopnea, PND, swelling or significant weight changes, acute bleeding, or claudication. Does admit to some dyspnea on exertion, particularly when going uphill.   03/06/2023 - He presents today for follow-up.  He is doing well.  Denies any recent chest pain. Continues to admit to stable DOE, relates this to his COPD. Denies any chest pain, palpitations, syncope, presyncope, dizziness, orthopnea, PND, swelling or significant  weight changes, acute bleeding, or claudication.  Please see the history of present illness.    All other systems reviewed and are negative.  EKGs/Labs/Other Studies Reviewed:    The following studies were reviewed today:   EKG:  EKG is not ordered today.    Echo 02/2023:  1. Left ventricular ejection fraction, by estimation, is 60 to 65%. The  left ventricle has normal function. The left ventricle has no regional  wall motion abnormalities. Left ventricular diastolic parameters are  consistent with Grade I diastolic  dysfunction (impaired relaxation). Normal LVEDP.   2. Right ventricular systolic function is normal. The right ventricular  size is normal. Tricuspid regurgitation signal is inadequate for assessing  PA pressure.   3. The mitral valve is normal in structure. No evidence of mitral valve  regurgitation. No evidence of mitral stenosis.   4. The aortic valve is tricuspid. There is mild calcification of the  aortic valve. Aortic valve regurgitation is not visualized. No aortic  stenosis is present.   Comparison(s): No significant change from prior study.  Lexiscan on 04/28/2021:   Findings are consistent with ischemia. The study is low risk.   No ST deviation was noted.   LV perfusion is abnormal. There is evidence of ischemia. Defect 1: There is a medium defect with mild reduction in uptake present in the apical to basal inferior location(s) that is partially reversible. There is normal wall motion in the defect area. Consistent with ischemia.   Left ventricular function is normal. End diastolic cavity size is mildly enlarged. End systolic  cavity size is normal.   Inferior perfusion defect worse on stress images suggests ischemia Low risk study, as area of ischemia is small.  Echocardiogram on 01/25/2017: Study Conclusions   - Left ventricle: The cavity size was mildly dilated. Wall    thickness was normal. Systolic function was normal. The estimated    ejection  fraction was in the range of 55% to 60%. Wall motion was    normal; there were no regional wall motion abnormalities. Doppler    parameters are consistent with abnormal left ventricular    relaxation (grade 1 diastolic dysfunction). Doppler parameters    are consistent with high ventricular filling pressure.  - Mitral valve: There was mild regurgitation.   Risk Assessment/Calculations:    The ASCVD Risk score (Arnett DK, et al., 2019) failed to calculate for the following reasons:   Cannot find a previous HDL lab   Cannot find a previous total cholesterol lab  Physical Exam:    VS:  There were no vitals taken for this visit.    Wt Readings from Last 3 Encounters:  03/06/23 238 lb 6.4 oz (108.1 kg)  02/09/23 238 lb 9.6 oz (108.2 kg)  01/31/23 245 lb 6.4 oz (111.3 kg)    GEN: Obese, 78 y.o. male in no acute distress HEENT: Normal NECK: No JVD; No carotid bruits CARDIAC: S1/S2, RRR, no murmurs, rubs, gallops; 2+ pulses RESPIRATORY:  Clear to auscultation without rales, wheezing or rhonchi  MUSCULOSKELETAL:  No edema; No deformity  SKIN: Warm and dry NEUROLOGIC:  Alert and oriented x 3 PSYCHIATRIC:  Normal affect   ASSESSMENT & PLAN:    In order of problems listed above:  CAD, DOE History of past stenting to LAD. Stable with no anginal symptoms. No indication for ischemic evaluation. No indication for ischemic evaluation as most recent stress test from January 2023 reviewed with patient. Chronic, stable DOE r/t his COPD. Continue current medication regimen. Heart healthy diet and regular cardiovascular exercise encouraged. Care and ED precautions discussed.   HTN BP stable. BP well controlled at home. Discussed to monitor BP at home at least 2 hours after medications and sitting for 5-10 minutes.  No medication changes at this time.  Heart healthy diet and regular cardiovascular exercise encouraged.   HLD Past LDL 56. PCP to manage. Continue atorvastatin. Heart healthy diet  and regular cardiovascular exercise encouraged.   4. COPD, DOE Denies any shortness of breath or acute exacerbations. Notes chronic, stable DOE. Recent Echo benign. No medication changes at this time. Continue to follow with PCP.  5. Pre-operative cardiovascular risk assessment Mr. Tauer's perioperative risk of a major cardiac event is 0.9% according to the Revised Cardiac Risk Index (RCRI).  Therefore, he is at low risk for perioperative complications.   His functional capacity is excellent at 6.61 METs according to the Duke Activity Status Index (DASI). Recommendations: According to ACC/AHA guidelines, no further cardiovascular testing needed.  The patient may proceed to surgery at acceptable risk.  Antiplatelet and/or Anticoagulation Recommendations: The patient should remain on Aspirin without interruption.    6. Disposition: Follow up with Dr. Wyline Mood or APP in 3-4 months or sooner if anything changes.   Medication Adjustments/Labs and Tests Ordered: Current medicines are reviewed at length with the patient today.  Concerns regarding medicines are outlined above.  No orders of the defined types were placed in this encounter.  No orders of the defined types were placed in this encounter.   Patient Instructions  Medication Instructions:  Your physician recommends that you continue on your current medications as directed. Please refer to the Current Medication list given to you today.  Labwork: None   Testing/Procedures: None   Follow-Up: Your physician recommends that you schedule a follow-up appointment in: 3-4 months   Any Other Special Instructions Will Be Listed Below (If Applicable).  If you need a refill on your cardiac medications before your next appointment, please call your pharmacy.   Signed, Sharlene Dory, NP

## 2023-06-21 ENCOUNTER — Other Ambulatory Visit: Payer: Self-pay | Admitting: Cardiology

## 2023-06-29 ENCOUNTER — Encounter: Payer: Self-pay | Admitting: Nurse Practitioner

## 2023-06-29 ENCOUNTER — Ambulatory Visit: Attending: Nurse Practitioner | Admitting: Nurse Practitioner

## 2023-06-29 VITALS — BP 116/64 | HR 68 | Ht 70.0 in | Wt 238.0 lb

## 2023-06-29 DIAGNOSIS — I10 Essential (primary) hypertension: Secondary | ICD-10-CM

## 2023-06-29 DIAGNOSIS — I2510 Atherosclerotic heart disease of native coronary artery without angina pectoris: Secondary | ICD-10-CM | POA: Diagnosis not present

## 2023-06-29 DIAGNOSIS — J449 Chronic obstructive pulmonary disease, unspecified: Secondary | ICD-10-CM

## 2023-06-29 DIAGNOSIS — R0609 Other forms of dyspnea: Secondary | ICD-10-CM

## 2023-06-29 DIAGNOSIS — E785 Hyperlipidemia, unspecified: Secondary | ICD-10-CM

## 2023-06-29 NOTE — Progress Notes (Unsigned)
 Cardiology Office Note:   Date: 03/06/2023 ID:  [name], DOB 1945-10-18, MRN [id number] PCP:  Elise Benne, MD [facility] Providers Cardiologist:  Dina Rich, MD   Referring MD: Elise Benne, MD  CC: Here for scheduled follow-up  History of Present Illness:    [name] is a 78 y.o. male with a PMH of CAD, HTN, HLD, COPD, hx of femur fracture, and prostate cancer, who presents today for scheduled follow-up.   History of bare-metal stent to LAD in 2003.  Underwent cardiac catheterization 2012 that showed LM patent, LAD with proximal stent mild ISR, patent mid stent, mid LAD lesion 90%.  OM2 30 to 40%, left circumflex 30% proximal, and RCA small non-dom with no disease.  Received DES to LAD, septal branch was jailed.  EF was found to be 65%.  In 2015 underwent exercise Cardiolite, negative for ischemic changes, EF 58%, moderate size mild intensity partially reversible inferior defect thought to be soft tissue attenuation, cannot rule out RCA ischemia.  Was admitted to [facility] in 2016 with chest pain.  Lexiscan was negative, normal EF.  Underwent LHC at [facility] in 2016, showed patent coronaries.  TTE 2018 showed normal EF, grade 1 DD.  Repeat NST in 2018 and 2021 was normal, low risk.   02/09/2023 - Doing well. Says he is scheduled for a colonoscopy in early December with [facility], states he has had carotid ultrasound performed at [facility] as well. Denies any chest pain, palpitations, syncope, presyncope, dizziness, orthopnea, PND, swelling or significant weight changes, acute bleeding, or claudication. Does admit to some dyspnea on exertion, particularly when going uphill.   03/06/2023 - He presents today for follow-up.  He is doing well.  Denies any recent chest pain. Continues to admit to stable DOE, relates this to his COPD. Denies any chest pain, palpitations, syncope, presyncope, dizziness, orthopnea, PND, swelling or significant  weight changes, acute bleeding, or claudication.  Please see the history of present illness.    All other systems reviewed and are negative.  EKGs/Labs/Other Studies Reviewed:    The following studies were reviewed today:   EKG:  EKG is not ordered today.    Echo 02/2023:  1. Left ventricular ejection fraction, by estimation, is 60 to 65%. The  left ventricle has normal function. The left ventricle has no regional  wall motion abnormalities. Left ventricular diastolic parameters are  consistent with Grade I diastolic  dysfunction (impaired relaxation). Normal LVEDP.   2. Right ventricular systolic function is normal. The right ventricular  size is normal. Tricuspid regurgitation signal is inadequate for assessing  PA pressure.   3. The mitral valve is normal in structure. No evidence of mitral valve  regurgitation. No evidence of mitral stenosis.   4. The aortic valve is tricuspid. There is mild calcification of the  aortic valve. Aortic valve regurgitation is not visualized. No aortic  stenosis is present.   Comparison(s): No significant change from prior study.  Lexiscan on 04/28/2021:   Findings are consistent with ischemia. The study is low risk.   No ST deviation was noted.   LV perfusion is abnormal. There is evidence of ischemia. Defect 1: There is a medium defect with mild reduction in uptake present in the apical to basal inferior location(s) that is partially reversible. There is normal wall motion in the defect area. Consistent with ischemia.   Left ventricular function is normal. End diastolic cavity size is mildly enlarged. End systolic  cavity size is normal.   Inferior perfusion defect worse on stress images suggests ischemia Low risk study, as area of ischemia is small.  Echocardiogram on 01/25/2017: Study Conclusions   - Left ventricle: The cavity size was mildly dilated. Wall    thickness was normal. Systolic function was normal. The estimated    ejection  fraction was in the range of 55% to 60%. Wall motion was    normal; there were no regional wall motion abnormalities. Doppler    parameters are consistent with abnormal left ventricular    relaxation (grade 1 diastolic dysfunction). Doppler parameters    are consistent with high ventricular filling pressure.  - Mitral valve: There was mild regurgitation.   Risk Assessment/Calculations:    The ASCVD Risk score (Arnett DK, et al., 2019) failed to calculate for the following reasons:   Cannot find a previous HDL lab   Cannot find a previous total cholesterol lab  Physical Exam:    VS:  There were no vitals taken for this visit.    Wt Readings from Last 3 Encounters:  03/06/23 238 lb 6.4 oz (108.1 kg)  02/09/23 238 lb 9.6 oz (108.2 kg)  01/31/23 245 lb 6.4 oz (111.3 kg)    GEN: Obese, 78 y.o. male in no acute distress HEENT: Normal NECK: No JVD; No carotid bruits CARDIAC: S1/S2, RRR, no murmurs, rubs, gallops; 2+ pulses RESPIRATORY:  Clear to auscultation without rales, wheezing or rhonchi  MUSCULOSKELETAL:  No edema; No deformity  SKIN: Warm and dry NEUROLOGIC:  Alert and oriented x 3 PSYCHIATRIC:  Normal affect   ASSESSMENT & PLAN:    In order of problems listed above:  CAD, DOE History of past stenting to LAD. Stable with no anginal symptoms. No indication for ischemic evaluation. No indication for ischemic evaluation as most recent stress test from January 2023 reviewed with patient. Chronic, stable DOE r/t his COPD. Continue current medication regimen. Heart healthy diet and regular cardiovascular exercise encouraged. Care and ED precautions discussed.   HTN BP stable. BP well controlled at home. Discussed to monitor BP at home at least 2 hours after medications and sitting for 5-10 minutes.  No medication changes at this time.  Heart healthy diet and regular cardiovascular exercise encouraged.   HLD Past LDL 56. PCP to manage. Continue atorvastatin. Heart healthy diet  and regular cardiovascular exercise encouraged.   4. COPD, DOE Denies any shortness of breath or acute exacerbations. Notes chronic, stable DOE. Recent Echo benign. No medication changes at this time. Continue to follow with PCP.  5. Pre-operative cardiovascular risk assessment Mr. Tauer's perioperative risk of a major cardiac event is 0.9% according to the Revised Cardiac Risk Index (RCRI).  Therefore, he is at low risk for perioperative complications.   His functional capacity is excellent at 6.61 METs according to the Duke Activity Status Index (DASI). Recommendations: According to ACC/AHA guidelines, no further cardiovascular testing needed.  The patient may proceed to surgery at acceptable risk.  Antiplatelet and/or Anticoagulation Recommendations: The patient should remain on Aspirin without interruption.    6. Disposition: Follow up with Dr. Wyline Mood or APP in 3-4 months or sooner if anything changes.   Medication Adjustments/Labs and Tests Ordered: Current medicines are reviewed at length with the patient today.  Concerns regarding medicines are outlined above.  No orders of the defined types were placed in this encounter.  No orders of the defined types were placed in this encounter.   Patient Instructions  Medication Instructions:  Your physician recommends that you continue on your current medications as directed. Please refer to the Current Medication list given to you today.  Labwork: None   Testing/Procedures: None   Follow-Up: Your physician recommends that you schedule a follow-up appointment in: 3-4 months   Any Other Special Instructions Will Be Listed Below (If Applicable).  If you need a refill on your cardiac medications before your next appointment, please call your pharmacy.   Signed, Sharlene Dory, NP

## 2023-06-29 NOTE — Patient Instructions (Addendum)
 Medication Instructions:  Your physician recommends that you continue on your current medications as directed. Please refer to the Current Medication list given to you today.   Labwork: none  Testing/Procedures: None  Follow-Up:  Your physician recommends that you schedule a follow-up appointment in: 6 months  Any Other Special Instructions Will Be Listed Below (If Applicable).  If you need a refill on your cardiac medications before your next appointment, please call your pharmacy.

## 2023-06-30 ENCOUNTER — Other Ambulatory Visit: Payer: Self-pay | Admitting: Nurse Practitioner

## 2023-07-31 NOTE — Progress Notes (Addendum)
 HPI: This 78 year old male returns for follow-up of adenocarcinoma the prostate, treated.  History as follows:   PSA noted to be 5.5 in January of 2022. He was found to have a 1.5 cm nodule in the midline of the base of the prostate. He underwent a TRUS biopsy of the prostate on 4.8.22 that confirmed G 4+5 adenocarcinoma of the prostate with 11 out of 12 biopsy cores positive for malignancy.    Imaging studies:  CT abdomen/pelvis (08/06/20): Negative for malignancy.  Bone scan (08/13/20): Negative for malignancy.    TNM stage: cT2a N0 M0 (1.5 cm nodule at base in midline)  PSA: 5.5  Gleason score: 4+5=9 (GG 5)  Biopsy (07/24/20): 11/12 cores positive  Left: L lateral apex (50%, 4+5), L apex (50%, 4+4, PNI), L lateral mid (70%, 4+5), L mid (80%, 4+5), L lateral base (90%, 4+5), L base (90%, 4+5)  Right: R apex (5%, 4+5), R lateral apex (20%, 4+5), R mid (40%, 4+5), R base (95%, 4+5), R lateral base (50%, 4+5)  Prostate volume: 52.7 cc    IPSS is 4.  SHIM score is 18.    5.20.2022: LT ADT initiated w/ Firmagon 240 mg  7.8.2022: Leuprolide 30 mg administered.  9.12.2022: I 125 brachytherapy/SpaceOAR    11.30.2022: He is here today for his leuprolide administration (30 mg). PSA <0.015.    12.15.2022: Completed 25 fractions of EBRT    4.5.2023: PSA <0.09, testosterone  levels castrate.  He received a 77-month Lupron injection  4.9.2024: First visit in a year, despite having directions to come back for continuing androgen deprivation therapy. He has not had a PSA done since his last visit. No LUTS, no blood in urine.  10.15.2024: PSA less than 0.1  4.15.2025: Here for recheck.  No real urinary symptoms of note.  IPSS 10/2.  No gross hematuria or blood in stool.   Past Medical History:  Diagnosis Date   Arthritis    Asthma    Cancer [facility])    Prostate   COPD (chronic obstructive pulmonary disease) (HCC)    Coronary atherosclerosis of native coronary artery 04/22/2001      Cardiac  Catheterization   COVID 10/2020   asymptomatic home test due to covid exposure   Diverticulitis    Dyspnea    WITH EXERTION   Encounter for long-term (current) use of insulin  (HCC)    Esophageal reflux    Gout, unspecified    History of blood transfusion    1 unit after 12-15-2020 surgery no problems with   History of kidney stones    Neuropathy    toes all the time   Old myocardial infarction 2013   Personal history of tobacco use, presenting hazards to health    Postsurgical percutaneous transluminal coronary angioplasty status    Prostate cancer (HCC)    Pure hypercholesterolemia    Skin cancer    Sleep apnea    USES CPAP   Type II or unspecified type diabetes mellitus without mention of complication, 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 Left dominent   CARDIAC CATHETERIZATION  01/26/2011   LAD: Patent proximal stent. 90% calcified eccentic stenosis in med segment, mild LCX disease  CAROTID STENTS     CORONARY ANGIOPLASTY WITH STENT PLACEMENT  2003   Prox LAD: 3.0 X12 BMS   CORONARY ANGIOPLASTY WITH STENT PLACEMENT  01/26/2011   Mid LAD: 3.0 X15 mm Vision BMS. Complicated by a jailed septal branch and periprocedural MI   EYE SURGERY Bilateral    laser for bleed   HEMORRHOID SURGERY     yrs ago per pt on 12-24-2020   HERNIA REPAIR     INGUINAL YRS AGO per pt on 12-24-2020   HUMERUS FRACTURE SURGERY Left    LEFT TOTAK KNEE REPLACEMENT  2015   IN [facility]   NASAL SINUS SURGERY     yrs ago per pt on 12-24-2020   ORIF FEMUR FRACTURE Left 03/19/2019   Procedure: OPEN REDUCTION INTERNAL FIXATION (ORIF) DISTAL FEMUR FRACTURE;  Surgeon: Hardy Lia, MD;  Location: [facility] OR;  Service: Orthopedics;  Laterality: Left;    ORIF FEMUR FRACTURE Left 08/06/2019   Procedure: REPAIR DISTAL FEMUR NONUNION WITH INFUSE AND ALLOGRAFT;  Surgeon: Hardy Lia, MD;  Location: [facility] OR;  Service: Orthopedics;  Laterality: Left;   RADIOACTIVE SEED IMPLANT N/A 12/28/2020   Procedure: RADIOACTIVE SEED IMPLANT/BRACHYTHERAPY IMPLANT;  Surgeon: Trent Frizzle, MD;  Location: [facility];  Service: Urology;  Laterality: N/A;   SPACE OAR INSTILLATION N/A 12/28/2020   Procedure: SPACE OAR INSTILLATION;  Surgeon: Trent Frizzle, MD;  Location: [facility];  Service: Urology;  Laterality: N/A;   TOTAL KNEE REVISION Left 12/15/2020   Procedure: LEFT TOTAL KNEE REVISION WITH REMOVAL OF HARDWARE;  Surgeon: Claiborne Crew, MD;  Location: [facility];  Service: Orthopedics;  Laterality: Left;    Home Medications:  Allergies as of 08/01/2023       Reactions   Lisinopril  Cough        Medication List        Accurate as of July 31, 2023 11:18 AM. If you have any questions, ask your nurse or doctor.          acarbose  100 MG tablet Commonly known as: PRECOSE  Take 100 mg by mouth 3 (three) times daily.   allopurinol  300 MG tablet Commonly known as: ZYLOPRIM  Take 300 mg by mouth daily.   amLODipine  10 MG tablet Commonly known as: NORVASC  Take 1 tablet (10 mg total) by mouth daily. What changed: when to take this   ascorbic acid  1000 MG tablet Commonly known as: VITAMIN C  Take 1 tablet (1,000 mg total) by mouth daily.   aspirin  EC 81 MG tablet Take 81 mg by mouth daily. Swallow whole.   atorvastatin  80 MG tablet Commonly known as: LIPITOR  TAKE 1 TABLET (80 MG TOTAL) BY MOUTH DAILY. (STOP SIMVASTATIN ) What changed: See the new instructions.   budesonide-formoterol  160-4.5 MCG/ACT inhaler Commonly known as: SYMBICORT Inhale 2 puffs into the lungs 2 (two) times daily.   carvedilol  25 MG tablet Commonly known as: COREG  Take 25 mg by mouth 2 (two) times daily with a meal.    cholecalciferol  25 MCG (1000 UNIT) tablet Commonly known as: VITAMIN D3 Take 1,000 Units by mouth daily.   diclofenac 75 MG EC tablet Commonly known as: VOLTAREN Take 75 mg by mouth 2 (two) times daily.   glipiZIDE  5 MG 24 hr tablet Commonly known as: GLUCOTROL  XL Take 5 mg by mouth every morning.   isosorbide  mononitrate 30 MG 24 hr tablet Commonly known as: IMDUR  TAKE 1 AND 1/2 TABLETS BY MOUTH ONCE DAILY   levocetirizine 5 MG tablet Commonly known as:  XYZAL  Take 5 mg by mouth every evening.   magnesium oxide 400 MG tablet Commonly known as: MAG-OX Take 400 mg by mouth daily.   methocarbamol  500 MG tablet Commonly known as: ROBAXIN  Take 1 tablet (500 mg total) by mouth every 6 (six) hours as needed for muscle spasms.   montelukast  10 MG tablet Commonly known as: SINGULAIR  Take 10 mg by mouth at bedtime.   multivitamin with minerals Tabs tablet Take 1 tablet by mouth daily.   nitroGLYCERIN  0.4 MG SL tablet Commonly known as: Nitrostat  Place 1 tablet (0.4 mg total) under the tongue every 5 (five) minutes as needed for chest pain.   oxyCODONE -acetaminophen  5-325 MG tablet Commonly known as: PERCOCET/ROXICET Take 1 tablet by mouth 3 (three) times daily as needed.   Ozempic (0.25 or 0.5 MG/DOSE) 2 MG/1.5ML Sopn Generic drug: Semaglutide(0.25 or 0.5MG /DOS) Inject 1 mg into the skin every Thursday.   pantoprazole  40 MG tablet Commonly known as: PROTONIX  Take 40 mg by mouth daily.   polyethylene glycol 17 g packet Commonly known as: MIRALAX  / GLYCOLAX  Take 17 g by mouth daily as needed for mild constipation.   PRESCRIPTION MEDICATION Inhale into the lungs at bedtime. CPAP   torsemide  20 MG tablet Commonly known as: DEMADEX  TAKE 2 TABLETS BY MOUTH ONCE DAILY, STOP FUROSEMIDE    valsartan 80 MG tablet Commonly known as: DIOVAN Take 80 mg by mouth daily.   zolpidem  10 MG tablet Commonly known as: AMBIEN  Take 10 mg by mouth at bedtime as needed for sleep.         Allergies:  Allergies  Allergen Reactions   Lisinopril  Cough    Family History  Problem Relation Age of Onset   Coronary artery disease Other        Family History   Breast cancer Neg Hx    Colon cancer Neg Hx    Prostate cancer Neg Hx    Pancreatic cancer Neg Hx     Social History:  reports that he quit smoking about 38 years ago. His smoking use included cigarettes. He started smoking about 64 years ago. He has a 38.9 pack-year smoking history. He quit smokeless tobacco use about 38 years ago.  His smokeless tobacco use included snuff and chew. He reports that he does not currently use alcohol. He reports that he does not use drugs.  ROS: A complete review of systems was performed.  All systems are negative except for pertinent findings as noted.  Physical Exam:  Vital signs in last 24 hours: There were no vitals taken for this visit. Constitutional:  Alert and oriented, No acute distress Cardiovascular: Regular rate.  He does have 3+ pitting edema bilaterally Respiratory: Normal respiratory effort Neurologic: Grossly intact, no focal deficits Psychiatric: Normal mood and affect  I have reviewed prior pt note  I have reviewed urinalysis results  I have independently reviewed prior imaging--ultrasound results reviewed  I have reviewed prior PSA and pathology results  Prior testosterone  levels reviewed--last testosterone  check was 30 and that was a year ago  IPSS reviewed--10/2   Impression/Assessment:  Grade group 5 prostate cancer, status post combination radiotherapy (completed December/2022) with incomplete androgen deprivation therapy.  He has had excellent PSA response thus far.  Plan:  I will check PSA and testosterone  level today  He asked for a prescription for sildenafil-unfortunately, he is on daily nitrates  I will see back in 6 months for recheck

## 2023-08-01 ENCOUNTER — Ambulatory Visit: Payer: Medicare Other | Admitting: Urology

## 2023-08-01 VITALS — BP 131/83 | HR 72

## 2023-08-01 DIAGNOSIS — Z79899 Other long term (current) drug therapy: Secondary | ICD-10-CM

## 2023-08-01 DIAGNOSIS — Z8546 Personal history of malignant neoplasm of prostate: Secondary | ICD-10-CM | POA: Diagnosis not present

## 2023-08-01 LAB — POCT URINALYSIS DIPSTICK
Bilirubin, UA: NEGATIVE
Blood, UA: NEGATIVE
Glucose, UA: NEGATIVE
Ketones, UA: NEGATIVE
Leukocytes, UA: NEGATIVE
Nitrite, UA: NEGATIVE
Protein, UA: NEGATIVE
Spec Grav, UA: 1.01 (ref 1.010–1.025)
Urobilinogen, UA: 0.2 U/dL
pH, UA: 6 (ref 5.0–8.0)

## 2023-08-02 ENCOUNTER — Telehealth: Payer: Self-pay

## 2023-08-02 LAB — PSA: Prostate Specific Ag, Serum: <0.1 ng/mL (ref 0.0–4.0)

## 2023-08-02 LAB — TESTOSTERONE: Testosterone: 47 ng/dL — ABNORMAL LOW (ref 264–916)

## 2023-08-02 NOTE — Telephone Encounter (Signed)
 Called Pt to relay results from MD pt voiced understanding

## 2023-08-02 NOTE — Telephone Encounter (Signed)
-----   Message from Malcolm Scrivener Dahlstedt sent at 08/02/2023 11:50 AM EDT ----- Please call, good news.  PSA still 0.  Testosterone level still low, has not increased yet. ----- Message ----- From: Roselee Cong, LPN Sent: 12/31/7827   3:07 PM EDT To: Trent Frizzle, MD

## 2023-08-16 ENCOUNTER — Ambulatory Visit: Admitting: Nurse Practitioner

## 2023-09-06 ENCOUNTER — Encounter (HOSPITAL_COMMUNITY): Payer: Self-pay

## 2023-09-06 ENCOUNTER — Emergency Department (HOSPITAL_COMMUNITY)

## 2023-09-06 ENCOUNTER — Emergency Department (HOSPITAL_COMMUNITY)
Admission: EM | Admit: 2023-09-06 | Discharge: 2023-09-06 | Disposition: A | Discharge status: Home / Self Care | Attending: Emergency Medicine | Admitting: Emergency Medicine

## 2023-09-06 ENCOUNTER — Other Ambulatory Visit: Payer: Self-pay

## 2023-09-06 DIAGNOSIS — M79672 Pain in left foot: Secondary | ICD-10-CM | POA: Diagnosis not present

## 2023-09-06 DIAGNOSIS — M79605 Pain in left leg: Secondary | ICD-10-CM | POA: Diagnosis present

## 2023-09-06 DIAGNOSIS — Z79899 Other long term (current) drug therapy: Secondary | ICD-10-CM | POA: Insufficient documentation

## 2023-09-06 DIAGNOSIS — Z7982 Long term (current) use of aspirin: Secondary | ICD-10-CM | POA: Insufficient documentation

## 2023-09-06 DIAGNOSIS — J449 Chronic obstructive pulmonary disease, unspecified: Secondary | ICD-10-CM | POA: Diagnosis not present

## 2023-09-06 DIAGNOSIS — I2510 Atherosclerotic heart disease of native coronary artery without angina pectoris: Secondary | ICD-10-CM | POA: Diagnosis not present

## 2023-09-06 DIAGNOSIS — M7989 Other specified soft tissue disorders: Secondary | ICD-10-CM | POA: Diagnosis not present

## 2023-09-06 LAB — CBC WITH DIFFERENTIAL/PLATELET
Abs Immature Granulocytes: 0.04 10*3/uL (ref 0.00–0.07)
Basophils Absolute: 0.1 10*3/uL (ref 0.0–0.1)
Basophils Relative: 1 %
Eosinophils Absolute: 0.2 10*3/uL (ref 0.0–0.5)
Eosinophils Relative: 2 %
HCT: 34.7 % — ABNORMAL LOW (ref 39.0–52.0)
Hemoglobin: 12.2 g/dL — ABNORMAL LOW (ref 13.0–17.0)
Immature Granulocytes: 0 %
Lymphocytes Relative: 9 %
Lymphs Abs: 0.9 10*3/uL (ref 0.7–4.0)
MCH: 33 pg (ref 26.0–34.0)
MCHC: 35.2 g/dL (ref 30.0–36.0)
MCV: 93.8 fL (ref 80.0–100.0)
Monocytes Absolute: 0.9 10*3/uL (ref 0.1–1.0)
Monocytes Relative: 9 %
Neutro Abs: 8 10*3/uL — ABNORMAL HIGH (ref 1.7–7.7)
Neutrophils Relative %: 79 %
Platelets: 225 10*3/uL (ref 150–400)
RBC: 3.7 MIL/uL — ABNORMAL LOW (ref 4.22–5.81)
RDW: 12.9 % (ref 11.5–15.5)
WBC: 10.1 10*3/uL (ref 4.0–10.5)
nRBC: 0 % (ref 0.0–0.2)

## 2023-09-06 LAB — BRAIN NATRIURETIC PEPTIDE: B Natriuretic Peptide: 76 pg/mL (ref 0.0–100.0)

## 2023-09-06 LAB — COMPREHENSIVE METABOLIC PANEL WITH GFR
ALT: 16 U/L (ref 0–44)
AST: 15 U/L (ref 15–41)
Albumin: 3.6 g/dL (ref 3.5–5.0)
Alkaline Phosphatase: 125 U/L (ref 38–126)
Anion gap: 7 (ref 5–15)
BUN: 20 mg/dL (ref 8–23)
CO2: 21 mmol/L — ABNORMAL LOW (ref 22–32)
Calcium: 9.1 mg/dL (ref 8.9–10.3)
Chloride: 103 mmol/L (ref 98–111)
Creatinine, Ser: 1.13 mg/dL (ref 0.61–1.24)
GFR, Estimated: >60 mL/min (ref >60)
Glucose, Bld: 262 mg/dL — ABNORMAL HIGH (ref 70–99)
Potassium: 4.6 mmol/L (ref 3.5–5.1)
Sodium: 131 mmol/L — ABNORMAL LOW (ref 135–145)
Total Bilirubin: 0.7 mg/dL (ref 0.0–1.2)
Total Protein: 7.4 g/dL (ref 6.5–8.1)

## 2023-09-06 LAB — TROPONIN I (HIGH SENSITIVITY)
Troponin I (High Sensitivity): 8 ng/L (ref <18)
Troponin I (High Sensitivity): 7 ng/L (ref <18)

## 2023-09-06 MED ORDER — MELOXICAM 15 MG PO TBDP: 15.0000 mg | ORAL_TABLET | Freq: Every day | ORAL | 0 refills | Status: AC

## 2023-09-06 MED ORDER — NAPROXEN 250 MG PO TABS
500.0000 mg | ORAL_TABLET | Freq: Once | ORAL | Status: AC
  Administered 2023-09-06: 500 mg via ORAL
  Filled 2023-09-06: qty 2

## 2023-09-06 NOTE — Discharge Instructions (Signed)
 Your testing has been reassuring, there is no signs of broken bones, your blood work was reassuring, you do need to start taking the furosemide  again, this is also known as Lasix .  You have told me that your family doctor has prescribed this for you again.  I would like for you to take this as prescribed by your doctor.  I have prescribed an anti-inflammatory called meloxicam, take this once a day as needed for pain  You need to have an ultrasound of your leg, I have ordered it, you will need to call the phone number below to arrange this, it can probably be done tomorrow morning  [phone]  Thank you for allowing us  to treat you in the emergency department today.  After reviewing your examination and potential testing that was done it appears that you are safe to go home.  I would like for you to follow-up with your doctor within the next several days, have them obtain your records and follow-up with them to review all potential tests and results from your visit.  If you should develop severe or worsening symptoms return to the emergency department immediately

## 2023-09-06 NOTE — ED Provider Triage Note (Signed)
 Emergency Medicine Provider Triage Evaluation Note  [name] , a 78 y.o. male hx of copd and cad was evaluated in triage.  Pt complains of L foot pain. Thinks that he twisted it while walking the other day.  Review of Systems  Positive: Foot pain Negative: Chest pain sob  Physical Exam  BP (!) 175/81 (BP Location: Right Arm)   Pulse 75   Temp 97.8 F (36.6 C) (Temporal)   Resp 16   Ht 5\' 10"  (1.778 m)   Wt 108 kg   SpO2 97%   BMI 34.16 kg/m  Gen:   Awake, no distress  Resp:  Normal effort  MSK:   Moves extremities without difficulty  Other:  LLE with ttp of medial malleolus, no swelling, no joint effusion or warmth, normal ROM, dp pulse 2+  Medical Decision Making  Medically screening exam initiated at 3:51 PM.  Appropriate orders placed.  [name] was informed that the remainder of the evaluation will be completed by another provider, this initial triage assessment does not replace that evaluation, and the importance of remaining in the ED until their evaluation is complete.   Ninetta Basket, MD 09/06/23 [phone]

## 2023-09-06 NOTE — ED Triage Notes (Signed)
 Pt arrived via POV from home c/o bilateral lower extremity swelling for past 3 days. Pt reports seeing his PCP on Monday and was told to drink more water  and sent home. Pt presents for further evaluation.

## 2023-09-06 NOTE — ED Provider Notes (Signed)
 [place] EMERGENCY DEPARTMENT AT [facility] Provider Note   CSN: [id number] Arrival date & time: 09/06/23  1105     History  Chief Complaint  Patient presents with   Leg Pain    [name] is a 78 y.o. male.   Leg Pain  This patient is a 78 year old male presenting to the hospital today with a complaint of pain in his left leg.  The patient has been on furosemide  for some time, he recently stopped taking the medication because the swelling came down, he then started to have increasing swelling although he is unclear whether the swelling started before or after having an injury to his left ankle which occurred several days ago.  He denies having chest pain or shortness of breath, no fevers or chills, no redness, the pain is up in the upper part of his leg above the calf as well.  There is no swelling above the knees, he has had repeated surgeries on his left leg after having trauma and injuries in the past.  Nothing recently.    Home Medications Prior to Admission medications   Medication Sig Start Date End Date Taking? Authorizing Provider  Meloxicam 15 MG TBDP Take 15 mg by mouth daily for 14 days. 09/06/23 09/20/23 Yes Early Glisson, MD  acarbose  (PRECOSE ) 100 MG tablet Take 100 mg by mouth 3 (three) times daily. 02/14/19   [provider]  allopurinol  (ZYLOPRIM ) 300 MG tablet Take 300 mg by mouth daily.  Patient not taking: Reported on 08/01/2023    [provider]  amLODipine  (NORVASC ) 10 MG tablet Take 1 tablet (10 mg total) by mouth daily. Patient taking differently: Take 10 mg by mouth at bedtime. 12/10/13   Laurann Pollock, MD  aspirin  EC 81 MG tablet Take 81 mg by mouth daily. Swallow whole.    [provider]  atorvastatin  (LIPITOR ) 80 MG tablet TAKE 1 TABLET (80 MG TOTAL) BY MOUTH DAILY. (STOP SIMVASTATIN ) Patient taking differently: daily. 08/08/14   Laurann Pollock, MD  budesonide-formoterol  [facility]) 160-4.5 MCG/ACT  inhaler Inhale 2 puffs into the lungs 2 (two) times daily.    [provider]  carvedilol  (COREG ) 25 MG tablet Take 25 mg by mouth 2 (two) times daily with a meal.    [provider]  cholecalciferol  (VITAMIN D3) 25 MCG (1000 UNIT) tablet Take 1,000 Units by mouth daily.    [provider]  diclofenac (VOLTAREN) 75 MG EC tablet Take 75 mg by mouth 2 (two) times daily. 04/07/21   [provider]  glipiZIDE  (GLUCOTROL  XL) 5 MG 24 hr tablet Take 5 mg by mouth every morning. 10/22/20   [provider]  isosorbide  mononitrate (IMDUR ) 30 MG 24 hr tablet TAKE 1 AND 1/2 TABLETS BY MOUTH ONCE DAILY 07/03/23   Laurann Pollock, MD  levocetirizine (XYZAL ) 5 MG tablet Take 5 mg by mouth every evening. 11/04/19   [provider]  magnesium oxide (MAG-OX) 400 MG tablet Take 400 mg by mouth daily. Patient not taking: Reported on 08/01/2023    [provider]  methocarbamol  (ROBAXIN ) 500 MG tablet Take 1 tablet (500 mg total) by mouth every 6 (six) hours as needed for muscle spasms. 12/17/20   Earnie Gola, PA-C  montelukast  (SINGULAIR ) 10 MG tablet Take 10 mg by mouth at bedtime.    [provider]  Multiple Vitamin (MULTIVITAMIN WITH MINERALS) TABS tablet Take 1 tablet by mouth daily.     [provider]  nitroGLYCERIN  (NITROSTAT ) 0.4 MG SL tablet Place 1 tablet (0.4 mg total) under the tongue every 5 (five) minutes as needed for chest pain. 11/11/22   Lasalle Pointer, NP  oxyCODONE -acetaminophen  (PERCOCET/ROXICET) 5-325 MG tablet Take 1 tablet by mouth 3 (three) times daily as needed.    [provider]  OZEMPIC, 0.25 OR 0.5 MG/DOSE, 2 MG/1.5ML SOPN Inject 1 mg into the skin every Thursday. 10/04/19   [provider]  pantoprazole  (PROTONIX ) 40 MG tablet Take 40 mg by mouth daily.    [provider]  polyethylene glycol (MIRALAX  / GLYCOLAX ) 17 g packet Take 17 g by mouth daily as needed for mild constipation.  12/17/20   Earnie Gola, PA-C  PRESCRIPTION MEDICATION Inhale into the lungs at bedtime. CPAP    [provider]  torsemide  (DEMADEX ) 20 MG tablet TAKE 2 TABLETS BY MOUTH ONCE DAILY, STOP FUROSEMIDE  06/21/23   Laurann Pollock, MD  valsartan (DIOVAN) 80 MG tablet Take 80 mg by mouth daily. 05/26/20   [provider]  vitamin C  (VITAMIN C ) 1000 MG tablet Take 1 tablet (1,000 mg total) by mouth daily. 03/23/19   Marisela Sicks, PA-C  zolpidem  (AMBIEN ) 10 MG tablet Take 10 mg by mouth at bedtime as needed for sleep.    [provider]      Allergies    Lisinopril     Review of Systems   Review of Systems  All other systems reviewed and are negative.   Physical Exam Updated Vital Signs BP (!) 174/77   Pulse 69   Temp 97.8 F (36.6 C) (Temporal)   Resp 18   Ht 1.778 m (5\' 10" )   Wt 108 kg   SpO2 96%   BMI 34.16 kg/m  Physical Exam Vitals and nursing note reviewed.  Constitutional:      General: He is not in acute distress.    Appearance: He is well-developed.  HENT:     Head: Normocephalic and atraumatic.     Mouth/Throat:     Pharynx: No oropharyngeal exudate.  Eyes:     General: No scleral icterus.       Right eye: No discharge.        Left eye: No discharge.     Conjunctiva/sclera: Conjunctivae normal.     Pupils: Pupils are equal, round, and reactive to light.  Neck:     Thyroid: No thyromegaly.     Vascular: No JVD.  Cardiovascular:     Rate and Rhythm: Normal rate and regular rhythm.     Heart sounds: Normal heart sounds. No murmur heard.    No friction rub. No gallop.  Pulmonary:     Effort: Pulmonary effort is normal. No respiratory distress.     Breath sounds: Normal breath sounds. No wheezing or rales.  Abdominal:     General: Bowel sounds are normal. There is no distension.     Palpations: Abdomen is soft. There is no mass.     Tenderness: There is no abdominal tenderness.  Musculoskeletal:        General: Tenderness present.  Normal range of motion.     Cervical back: Normal range of motion and neck supple.     Right lower leg: No edema.     Left lower leg: No edema.     Comments: No visible asymmetry of the legs, mild tenderness around the ankle and calf, no redness, normal pulses.  Lymphadenopathy:     Cervical: No cervical adenopathy.  Skin:    General: Skin is warm and dry.     Findings: No erythema or rash.  Neurological:     Mental Status: He is alert.     Coordination: Coordination normal.  Psychiatric:        Behavior: Behavior normal.     ED Results / Procedures / Treatments   Labs (all labs ordered are listed, but only abnormal results are displayed) Labs Reviewed  CBC WITH DIFFERENTIAL/PLATELET - Abnormal; Notable for the following components:      Result Value   RBC 3.70 (*)    Hemoglobin 12.2 (*)    HCT 34.7 (*)    Neutro Abs 8.0 (*)    All other components within normal limits  COMPREHENSIVE METABOLIC PANEL WITH GFR - Abnormal; Notable for the following components:   Sodium 131 (*)    CO2 21 (*)    Glucose, Bld 262 (*)    All other components within normal limits  BRAIN NATRIURETIC PEPTIDE  TROPONIN I (HIGH SENSITIVITY)  TROPONIN I (HIGH SENSITIVITY)    EKG None  Radiology DG Chest 2 View Result Date: 09/06/2023 CLINICAL DATA:  Bilateral lower extremity swelling for several days. EXAM: CHEST - 2 VIEW COMPARISON:  December 03, 2020 FINDINGS: Stable cardiomediastinal silhouette. No acute pulmonary abnormality is noted. The visualized skeletal structures are unremarkable. IMPRESSION: No active cardiopulmonary disease. Electronically Signed   By: Rosalene Colon M.D.   On: 09/06/2023 14:13   DG Ankle Complete Left Result Date: 09/06/2023 CLINICAL DATA:  Bilateral lower extremity swelling for 3 days. EXAM: LEFT ANKLE COMPLETE - 3+ VIEW COMPARISON:  None Available. FINDINGS: There is no evidence of fracture, dislocation, or joint effusion. There is no evidence of arthropathy or other  focal bone abnormality. Soft tissues are unremarkable. IMPRESSION: Negative. Electronically Signed   By: Rosalene Colon M.D.   On: 09/06/2023 14:11    Procedures Procedures    Medications Ordered in ED Medications  naproxen (NAPROSYN) tablet 500 mg (has no administration in time range)    ED Course/ Medical Decision Making/ A&P                                 Medical Decision Making Amount and/or Complexity of Data Reviewed Labs: ordered. Radiology: ordered.  Risk Prescription drug management.    This patient presents to the ED for concern of swelling and pain in the leg differential diagnosis includes DVT seems less likely but still a possibility given the recurrent and multiple surgeries on the left side.  He has had a recent injury and ankle pain but no signs of fracture.  Labs show no other reason to be edematous.  No renal or liver dysfunction.  He states his family doctor has resend in Lasix  for him.  Ultrasound scheduled for tomorrow morning   Lab Tests:  I Ordered, and personally interpreted labs.  The pertinent results include: Normal labs   Imaging Studies ordered:  I ordered imaging studies including x-ray of chest and ankle I independently visualized and interpreted imaging which showed no acute findings I agree with the radiologist interpretation   Medicines ordered and prescription drug management:  I ordered medication including Naprosyn for pain, meloxicam for home Reevaluation of the patient after these medicines showed that the patient stable I have reviewed the patients home medicines and have made adjustments as needed   Problem List / ED Course:  Stable for  discharge, will come back in the morning for ultrasound   Social Determinants of Health:  None   I have discussed with the patient at the bedside the results, and the meaning of these results.  They have had opportunity to ask questions,  expressed their understanding to the need  for follow-up with primary care physician           Final Clinical Impression(s) / ED Diagnoses Final diagnoses:  Left leg swelling    Rx / DC Orders ED Discharge Orders          Ordered    Meloxicam 15 MG TBDP  Daily        09/06/23 1856    US  Venous Img Lower Unilateral Left        09/06/23 1856              Early Glisson, MD 09/06/23 1858

## 2023-09-07 ENCOUNTER — Other Ambulatory Visit (HOSPITAL_COMMUNITY): Payer: Self-pay | Admitting: Emergency Medicine

## 2023-09-07 ENCOUNTER — Ambulatory Visit (HOSPITAL_COMMUNITY)
Admission: RE | Admit: 2023-09-07 | Discharge: 2023-09-07 | Disposition: A | Discharge status: Home / Self Care | Source: Ambulatory Visit | Attending: Emergency Medicine | Admitting: Emergency Medicine

## 2023-09-07 ENCOUNTER — Telehealth (HOSPITAL_COMMUNITY): Payer: Self-pay | Admitting: Emergency Medicine

## 2023-09-07 ENCOUNTER — Other Ambulatory Visit (HOSPITAL_COMMUNITY): Payer: Self-pay

## 2023-09-07 DIAGNOSIS — M7989 Other specified soft tissue disorders: Secondary | ICD-10-CM | POA: Diagnosis present

## 2023-09-07 MED ORDER — APIXABAN (ELIQUIS) VTE STARTER PACK (10MG AND 5MG)
ORAL_TABLET | ORAL | 0 refills | Status: DC
Start: 2023-09-07 — End: 2023-10-17
  Filled 2023-09-07: qty 74, 30d supply, fill #0

## 2023-09-07 NOTE — Telephone Encounter (Signed)
 Pt had outpatient duplex performed  Has resulted, he has distal dvt peroneal/tibial veins. He is symptomatic. Will go ahead and treat with DOAC, sent to [facility] community pharmacy. Discussed w/ patient over telephone.  Will f/u with pcp for recheck in the next week

## 2023-09-12 ENCOUNTER — Other Ambulatory Visit (HOSPITAL_COMMUNITY): Payer: Self-pay

## 2023-10-17 ENCOUNTER — Emergency Department (HOSPITAL_COMMUNITY)
Admission: EM | Admit: 2023-10-17 | Discharge: 2023-10-17 | Disposition: A | Discharge status: Home / Self Care | Attending: Emergency Medicine | Admitting: Emergency Medicine

## 2023-10-17 ENCOUNTER — Emergency Department (HOSPITAL_COMMUNITY)

## 2023-10-17 ENCOUNTER — Other Ambulatory Visit: Payer: Self-pay

## 2023-10-17 ENCOUNTER — Encounter (HOSPITAL_COMMUNITY): Payer: Self-pay

## 2023-10-17 DIAGNOSIS — Z794 Long term (current) use of insulin: Secondary | ICD-10-CM | POA: Insufficient documentation

## 2023-10-17 DIAGNOSIS — Z7901 Long term (current) use of anticoagulants: Secondary | ICD-10-CM | POA: Diagnosis not present

## 2023-10-17 DIAGNOSIS — I10 Essential (primary) hypertension: Secondary | ICD-10-CM | POA: Insufficient documentation

## 2023-10-17 DIAGNOSIS — Z79899 Other long term (current) drug therapy: Secondary | ICD-10-CM | POA: Insufficient documentation

## 2023-10-17 DIAGNOSIS — I2510 Atherosclerotic heart disease of native coronary artery without angina pectoris: Secondary | ICD-10-CM | POA: Insufficient documentation

## 2023-10-17 DIAGNOSIS — R1310 Dysphagia, unspecified: Secondary | ICD-10-CM | POA: Diagnosis present

## 2023-10-17 DIAGNOSIS — E119 Type 2 diabetes mellitus without complications: Secondary | ICD-10-CM | POA: Insufficient documentation

## 2023-10-17 DIAGNOSIS — Z8546 Personal history of malignant neoplasm of prostate: Secondary | ICD-10-CM | POA: Diagnosis not present

## 2023-10-17 DIAGNOSIS — Z7984 Long term (current) use of oral hypoglycemic drugs: Secondary | ICD-10-CM | POA: Diagnosis not present

## 2023-10-17 DIAGNOSIS — R519 Headache, unspecified: Secondary | ICD-10-CM | POA: Insufficient documentation

## 2023-10-17 LAB — CBC WITH DIFFERENTIAL/PLATELET
Abs Immature Granulocytes: 0.02 10*3/uL (ref 0.00–0.07)
Basophils Absolute: 0 10*3/uL (ref 0.0–0.1)
Basophils Relative: 0 %
Eosinophils Absolute: 0.2 10*3/uL (ref 0.0–0.5)
Eosinophils Relative: 4 %
HCT: 31 % — ABNORMAL LOW (ref 39.0–52.0)
Hemoglobin: 9.9 g/dL — ABNORMAL LOW (ref 13.0–17.0)
Immature Granulocytes: 0 %
Lymphocytes Relative: 11 %
Lymphs Abs: 0.7 10*3/uL (ref 0.7–4.0)
MCH: 31.2 pg (ref 26.0–34.0)
MCHC: 31.9 g/dL (ref 30.0–36.0)
MCV: 97.8 fL (ref 80.0–100.0)
Monocytes Absolute: 0.7 10*3/uL (ref 0.1–1.0)
Monocytes Relative: 11 %
Neutro Abs: 4.4 10*3/uL (ref 1.7–7.7)
Neutrophils Relative %: 74 %
Platelets: 183 10*3/uL (ref 150–400)
RBC: 3.17 MIL/uL — ABNORMAL LOW (ref 4.22–5.81)
RDW: 14.3 % (ref 11.5–15.5)
WBC: 6 10*3/uL (ref 4.0–10.5)
nRBC: 0 % (ref 0.0–0.2)

## 2023-10-17 LAB — COMPREHENSIVE METABOLIC PANEL WITH GFR
ALT: 18 U/L (ref 0–44)
AST: 14 U/L — ABNORMAL LOW (ref 15–41)
Albumin: 2.9 g/dL — ABNORMAL LOW (ref 3.5–5.0)
Alkaline Phosphatase: 94 U/L (ref 38–126)
Anion gap: 10 (ref 5–15)
BUN: 18 mg/dL (ref 8–23)
CO2: 22 mmol/L (ref 22–32)
Calcium: 8.6 mg/dL — ABNORMAL LOW (ref 8.9–10.3)
Chloride: 104 mmol/L (ref 98–111)
Creatinine, Ser: 1.07 mg/dL (ref 0.61–1.24)
GFR, Estimated: >60 mL/min (ref >60)
Glucose, Bld: 420 mg/dL — ABNORMAL HIGH (ref 70–99)
Potassium: 4.1 mmol/L (ref 3.5–5.1)
Sodium: 136 mmol/L (ref 135–145)
Total Bilirubin: 0.6 mg/dL (ref 0.0–1.2)
Total Protein: 6.7 g/dL (ref 6.5–8.1)

## 2023-10-17 LAB — URINALYSIS, ROUTINE W REFLEX MICROSCOPIC
Bacteria, UA: NONE SEEN
Bilirubin Urine: NEGATIVE
Glucose, UA: >=500 mg/dL — AB
Hgb urine dipstick: NEGATIVE
Ketones, ur: NEGATIVE mg/dL
Leukocytes,Ua: NEGATIVE
Nitrite: NEGATIVE
Protein, ur: NEGATIVE mg/dL
Specific Gravity, Urine: 1.007 (ref 1.005–1.030)
pH: 5 (ref 5.0–8.0)

## 2023-10-17 LAB — CBG MONITORING, ED
Glucose-Capillary: 407 mg/dL — ABNORMAL HIGH (ref 70–99)
Glucose-Capillary: 190 mg/dL — ABNORMAL HIGH (ref 70–99)

## 2023-10-17 LAB — BETA-HYDROXYBUTYRIC ACID: Beta-Hydroxybutyric Acid: 0.14 mmol/L (ref 0.05–0.27)

## 2023-10-17 MED ORDER — IOHEXOL 350 MG/ML SOLN
75.0000 mL | Freq: Once | INTRAVENOUS | Status: AC | PRN
  Administered 2023-10-17: 75 mL via INTRAVENOUS

## 2023-10-17 MED ORDER — LABETALOL HCL 5 MG/ML IV SOLN: 10.0000 mg | Freq: Once | INTRAVENOUS | Status: DC

## 2023-10-17 MED ORDER — SODIUM CHLORIDE 0.9 % IV BOLUS
1000.0000 mL | Freq: Once | INTRAVENOUS | Status: AC
  Administered 2023-10-17: 1000 mL via INTRAVENOUS

## 2023-10-17 MED ORDER — MORPHINE SULFATE (PF) 4 MG/ML IV SOLN
4.0000 mg | Freq: Once | INTRAVENOUS | Status: AC
  Administered 2023-10-17: 4 mg via INTRAVENOUS
  Filled 2023-10-17: qty 1

## 2023-10-17 MED ORDER — OXYCODONE-ACETAMINOPHEN 5-325 MG PO TABS
1.0000 | ORAL_TABLET | Freq: Once | ORAL | Status: AC
  Administered 2023-10-17: 1 via ORAL
  Filled 2023-10-17: qty 1

## 2023-10-17 MED ORDER — INSULIN ASPART 100 UNIT/ML IJ SOLN
8.0000 [IU] | Freq: Once | INTRAMUSCULAR | Status: AC
Start: 2023-10-17 — End: 2023-10-17
  Administered 2023-10-17: 8 [IU] via SUBCUTANEOUS

## 2023-10-17 MED ORDER — LABETALOL HCL 5 MG/ML IV SOLN: 20.0000 mg | Freq: Once | INTRAVENOUS | Status: DC

## 2023-10-17 MED ORDER — ONDANSETRON HCL 4 MG/2ML IJ SOLN
4.0000 mg | Freq: Once | INTRAMUSCULAR | Status: AC
Start: 2023-10-17 — End: 2023-10-17
  Administered 2023-10-17: 4 mg via INTRAVENOUS
  Filled 2023-10-17: qty 2

## 2023-10-17 NOTE — ED Triage Notes (Signed)
 Pt reports he does take a blood thinner due to previous blood clot.

## 2023-10-17 NOTE — ED Provider Notes (Signed)
 Patient signed out to me by Mliss Narrow, PA-C pending CTA head and neck and reevaluation   Patient here with complaint of dysphagia that has been going on for some time, he has been followed by GI in [facility]  he is pending upper endoscopy.  He is also complaining of left-sided headache that radiates from his temple area into his neck.  The head pain has been going on for at least 1 month but worse today  See previous provider note for complete H&P.    Patient had CTA head and neck to rule out vascular acute vascular issue, imaging results reassuring.  His blood sugar was initially elevated but has improved, now 190.  He has reassuring anion gap and bicarb   He has been given medication here for his headache symptoms.  Patient also has chronic sinus disease.  Discussed care plan with Dr. Zammit.  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       CT ANGIO HEAD NECK W WO CM Result Date: 10/17/2023 CLINICAL DATA:  Headache, increasing frequency or severity. Left facial numbness, slurred speech, and dysphagia for the past few months. EXAM: CT ANGIOGRAPHY HEAD AND NECK WITH AND WITHOUT CONTRAST TECHNIQUE: Multidetector CT imaging of the head and neck was performed using the standard protocol during bolus administration of intravenous contrast. Multiplanar CT image reconstructions and MIPs were obtained to evaluate the vascular anatomy. Carotid stenosis measurements (when applicable) are obtained utilizing NASCET criteria, using the distal internal carotid diameter as the denominator. RADIATION DOSE REDUCTION: This exam was performed according to the departmental dose-optimization program which includes automated exposure control, adjustment of the mA and/or kV according to patient size and/or use of iterative reconstruction technique. CONTRAST:  75mL OMNIPAQUE  IOHEXOL  350 MG/ML SOLN COMPARISON:  Earlier same day CT head. FINDINGS: CTA NECK  FINDINGS Aortic arch: Standard configuration of the aortic arch. Imaged portion shows no evidence of aneurysm or dissection. Mild atherosclerosis of the visualized aortic arch. No significant stenosis of the major arch vessel origins. Pulmonary arteries: As permitted by contrast timing, there are no filling defects in the visualized pulmonary arteries. Subclavian arteries: The subclavian arteries are patent bilaterally. Right carotid system: Patent. Mild atherosclerosis at the carotid bifurcation without hemodynamically significant stenosis. No evidence of dissection. Tortuosity of the mid cervical ICA. Left carotid system: Patent. Mild atherosclerosis at the carotid bifurcation without hemodynamically significant stenosis. No evidence of dissection. Tortuosity of the mid cervical ICA. Vertebral arteries: The vertebral arteries are code dominant. Atherosclerosis at the origin of the left vertebral artery resulting in focal severe stenosis. Tortuosity of the left V1 segment. The vertebral arteries are patent from the origins to the vertebrobasilar confluence. No evidence of dissection. Skeleton: No acute findings. Degenerative changes in the cervical spine. Disc space narrowing greatest at C5-6. Edentulous maxilla and mandible. Other neck: Asymmetric soft tissue along the left aspect of the nasopharynx visualized on series 4, image 110. The airway is patent. No cervical lymphadenopathy. Mucosal thickening and complete opacification of the right maxillary sinus. Scattered mucosal thickening in the ethmoid sinuses, right greater than left. Near complete opacification of the right sphenoid sinus. Upper chest: Paraseptal emphysema in the lung apices. Small amount of soft tissue along the posterior aspect of the intrathoracic trachea. Review of the MIP images confirms the above findings CTA HEAD FINDINGS ANTERIOR CIRCULATION: The intracranial ICAs are patent bilaterally. Mild atherosclerosis of the carotid siphons. No  significant stenosis, proximal occlusion,  aneurysm, or vascular malformation. MCAs: The middle cerebral arteries are patent bilaterally. ACAs: The anterior cerebral arteries are patent bilaterally. POSTERIOR CIRCULATION: No significant stenosis, proximal occlusion, aneurysm, or vascular malformation. PCAs: The posterior cerebral arteries are patent bilaterally. Pcomm: Not well visualized. SCAs: The superior cerebellar arteries are patent bilaterally. Basilar artery: Patent AICAs: Not well visualized. PICAs: Patent Vertebral arteries: The intracranial vertebral arteries are patent. Venous sinuses: As permitted by contrast timing, patent. Anatomic variants: None Review of the MIP images confirms the above findings IMPRESSION: No large vessel occlusion. 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 No high-grade stenosis of the intracranial arterial vasculature. Asymmetric soft tissue along the left nasopharynx. Recommend nonemergent correlation with direct visualization. Secretions layering in the intrathoracic trachea concerning for aspiration. Aortic Atherosclerosis (ICD10-I70.0). Electronically Signed   By: Donnice Mania M.D.   On: 10/17/2023 19:45   CT Head Wo Contrast Result Date: 10/17/2023 CLINICAL DATA:  Provided history: Neuro deficit, acute, stroke suspected. Altered mental status. Confusion. EXAM: CT HEAD WITHOUT CONTRAST TECHNIQUE: Contiguous axial images were obtained from the base of the skull through the vertex without intravenous contrast. RADIATION DOSE REDUCTION: This exam was performed according to the departmental dose-optimization program which includes automated exposure control, adjustment of the mA and/or kV according to patient size and/or use of iterative reconstruction technique. COMPARISON:  None. FINDINGS: Brain: Generalized cerebral and cerebellar atrophy. There is no acute intracranial hemorrhage. No demarcated cortical infarct. No extra-axial  fluid collection. No evidence of an intracranial mass. No midline shift. Vascular: No hyperdense vessel.  Atherosclerotic calcifications. Skull: No calvarial fracture or aggressive osseous lesion. Sinuses/Orbits: No mass or acute finding within the imaged orbits. Complete opacification of the right maxillary sinus at the imaged levels (with associated chronic reactive osteitis). Mild mucosal thickening within the left maxillary sinus at the imaged levels. Moderate-to-severe right sphenoid sinusitis. Mild left sphenoid sinus mucosal thickening. Bilateral ethmoid sinusitis (moderate-to-severe right, mild left). Mild mucosal thickening within the bilateral frontal sinuses. Other:Left mastoid effusion. Chronic appearing nasal bone fracture deformity (without overlying soft tissue swelling). IMPRESSION: 1. No evidence of an acute intracranial abnormality. 2. Generalized parenchymal atrophy. 3. Paranasal sinus disease as described. 4. Left mastoid effusion. Electronically Signed   By: Rockey Childs D.O.   On: 10/17/2023 13:49        Herlinda Milling, PA-C 10/17/23 2327    Suzette Pac, MD 10/18/23 1148

## 2023-10-17 NOTE — ED Triage Notes (Signed)
 Pt arrived via POV c/o left facial numbness, slurred speech and dysphagia for the past few months. Pt reports he was able to drive himself to the ER for evaluation today.

## 2023-10-17 NOTE — ED Notes (Signed)
 Transported to CT

## 2023-10-17 NOTE — Discharge Instructions (Signed)
 Call the neurology group listed to arrange follow-up appointment.  Please monitor your blood sugar closely, you may need to follow-up with your primary care provider regarding your blood sugar.  Return to the ER for any new or worsening symptoms.

## 2023-10-17 NOTE — ED Provider Notes (Cosign Needed Addendum)
 [place] EMERGENCY DEPARTMENT AT [facility] Provider Note   CSN: [id number] Arrival date & time: 10/17/23  1239     Patient presents with: Dysphagia   [name] is a 78 y.o. male with history of CAD, hypercholesterolemia, hypertension, type 2 diabetes prostate cancer who was recently also diagnosed and treated for temporal arteritis and  pharyngoesophageal dysphagia under the care of Dr. Lavern in [place], and pending upper endoscopy by GI to rule out esophageal stricture or other source of dysphagia,   here today for severe headache,  starting left lateral head and radiating into his neck, present for the past several months, worsened today.  He describes a sharp constant pain, sometimes feels it localizes to the left ear.   He is on oxycodone  3 times daily for chronic back pain, he states this does not relieve his head and neck pain.      The history is provided by the patient.       Prior to Admission medications   Medication Sig Start Date End Date Taking? Authorizing Provider  acarbose  (PRECOSE ) 100 MG tablet Take 100 mg by mouth 3 (three) times daily. 02/14/19  Yes [provider]  acetaminophen  (TYLENOL ) 500 MG tablet Take 1,500 mg by mouth 3 (three) times daily as needed (take with oxycodone  for pain).   Yes [provider]  allopurinol  (ZYLOPRIM ) 300 MG tablet Take 300 mg by mouth daily.   Yes [provider]  alum & mag hydroxide-simeth (MAALOX/MYLANTA) 200-200-20 MG/5ML suspension Take 15 mLs by mouth every 6 (six) hours as needed for indigestion or heartburn.   Yes [provider]  amLODipine  (NORVASC ) 5 MG tablet Take 1 tablet by mouth daily. 05/17/23  Yes [provider]  atorvastatin  (LIPITOR ) 80 MG tablet TAKE 1 TABLET (80 MG TOTAL) BY MOUTH DAILY. (STOP SIMVASTATIN ) Patient taking differently: Take 80 mg by mouth daily. 08/08/14  Yes Branch, Dorn FALCON, MD  Azelastine HCl 137 MCG/SPRAY SOLN Place 1 spray  into both nostrils 2 (two) times daily. 10/01/23  Yes [provider]  budesonide-formoterol  (SYMBICORT) 160-4.5 MCG/ACT inhaler Inhale 2 puffs into the lungs 2 (two) times daily.   Yes [provider]  carvedilol  (COREG ) 25 MG tablet Take 25 mg by mouth 2 (two) times daily with a meal.   Yes [provider]  cholecalciferol  (VITAMIN D3) 25 MCG (1000 UNIT) tablet Take 1,000 Units by mouth daily.   Yes [provider]  docusate sodium  (COLACE) 100 MG capsule Take 100 mg by mouth 3 (three) times daily as needed for mild constipation (take with oxycodone ).   Yes [provider]  ELIQUIS  5 MG TABS tablet Take 5 mg by mouth 2 (two) times daily. 09/19/23  Yes [provider]  gabapentin  (NEURONTIN ) 300 MG capsule Take 300 mg by mouth 3 (three) times daily. 05/17/23  Yes [provider]  glipiZIDE  (GLUCOTROL  XL) 5 MG 24 hr tablet Take 5 mg by mouth every morning. 10/22/20  Yes [provider]  insulin  glargine (LANTUS ) 100 UNIT/ML injection Inject 15-35 Units into the skin in the morning, at noon, and at bedtime.   Yes [provider]  isosorbide  mononitrate (IMDUR ) 30 MG 24 hr tablet TAKE 1 AND 1/2 TABLETS BY MOUTH ONCE DAILY Patient taking differently: Take 45 mg by mouth at bedtime. 07/03/23  Yes BranchDorn FALCON, MD  levocetirizine (XYZAL ) 5 MG tablet Take 5 mg by mouth every evening. 11/04/19  Yes [provider]  montelukast  (  SINGULAIR ) 10 MG tablet Take 10 mg by mouth at bedtime.   Yes [provider]  Multiple Vitamin (MULTIVITAMIN WITH MINERALS) TABS tablet Take 1 tablet by mouth daily.    Yes [provider]  nitroGLYCERIN  (NITROSTAT ) 0.4 MG SL tablet Place 1 tablet (0.4 mg total) under the tongue every 5 (five) minutes as needed for chest pain. 11/11/22  Yes Miriam Norris, NP  oxyCODONE -acetaminophen  (PERCOCET/ROXICET) 5-325 MG tablet Take 1 tablet by mouth 3 (three) times daily as needed for  moderate pain (pain score 4-6).   Yes [provider]  torsemide  (DEMADEX ) 20 MG tablet TAKE 2 TABLETS BY MOUTH ONCE DAILY, STOP FUROSEMIDE  Patient taking differently: Take 20 mg by mouth daily. 06/21/23  Yes Alvan Dorn FALCON, MD  valsartan (DIOVAN) 80 MG tablet Take 80 mg by mouth daily. 05/26/20  Yes [provider]  vitamin C  (VITAMIN C ) 1000 MG tablet Take 1 tablet (1,000 mg total) by mouth daily. 03/23/19  Yes Deward Eck, PA-C  zolpidem  (AMBIEN ) 10 MG tablet Take 10 mg by mouth at bedtime as needed for sleep.   Yes [provider]  PRESCRIPTION MEDICATION Inhale into the lungs at bedtime. CPAP    [provider]    Allergies: Lisinopril     Review of Systems  Constitutional:  Negative for chills and fever.  HENT:  Positive for ear pain and trouble swallowing. Negative for congestion, dental problem, facial swelling, rhinorrhea and sore throat.   Eyes: Negative.  Negative for visual disturbance.  Respiratory:  Negative for chest tightness and shortness of breath.   Cardiovascular:  Negative for chest pain.  Gastrointestinal:  Negative for abdominal pain, nausea and vomiting.  Genitourinary: Negative.   Musculoskeletal:  Negative for arthralgias, joint swelling and neck pain.  Skin: Negative.  Negative for rash and wound.  Neurological:  Positive for headaches. Negative for dizziness, weakness, light-headedness and numbness.  Psychiatric/Behavioral: Negative.    All other systems reviewed and are negative.   Updated Vital Signs BP (!) 157/61   Pulse 69   Temp 97.6 F (36.4 C) (Oral)   Resp 16   Ht 5' 10 (1.778 m)   Wt 103.4 kg   SpO2 96%   BMI 32.71 kg/m   Physical Exam Vitals and nursing note reviewed.  Constitutional:      Appearance: He is well-developed.     Comments: Appears anxious and uncomfortable  HENT:     Head: Normocephalic and atraumatic.     Right Ear: Tympanic membrane and ear canal normal. No mastoid tenderness.  Tympanic membrane is not bulging.     Left Ear: Tympanic membrane and ear canal normal. No mastoid tenderness. Tympanic membrane is not bulging.     Nose: Congestion present.     Mouth/Throat:     Mouth: Mucous membranes are moist.     Comments: edentulous  Eyes:     Conjunctiva/sclera: Conjunctivae normal.   Neck:     Vascular: No carotid bruit.     Comments: Pt's left face ttp starting left temple through left lateral neck.  No edema,  no rash or erythema.   Cardiovascular:     Rate and Rhythm: Normal rate and regular rhythm.     Heart sounds: Normal heart sounds.  Pulmonary:     Effort: Pulmonary effort is normal.     Breath sounds: Normal breath sounds. No wheezing.  Abdominal:     General: Bowel sounds are normal.     Palpations: Abdomen is soft.  Tenderness: There is no abdominal tenderness.   Musculoskeletal:        General: Normal range of motion.     Cervical back: Normal range of motion. Tenderness present.   Skin:    General: Skin is warm and dry.   Neurological:     General: No focal deficit present.     Mental Status: He is alert.     (all labs ordered are listed, but only abnormal results are displayed) Labs Reviewed  CBC WITH DIFFERENTIAL/PLATELET - Abnormal; Notable for the following components:      Result Value   RBC 3.17 (*)    Hemoglobin 9.9 (*)    HCT 31.0 (*)    All other components within normal limits  COMPREHENSIVE METABOLIC PANEL WITH GFR - Abnormal; Notable for the following components:   Glucose, Bld 420 (*)    Calcium  8.6 (*)    Albumin  2.9 (*)    AST 14 (*)    All other components within normal limits  URINALYSIS, ROUTINE W REFLEX MICROSCOPIC - Abnormal; Notable for the following components:   Color, Urine STRAW (*)    Glucose, UA >=500 (*)    All other components within normal limits  CBG MONITORING, ED - Abnormal; Notable for the following components:   Glucose-Capillary 407 (*)    All other components within normal limits   BETA-HYDROXYBUTYRIC ACID    EKG: None  Radiology: CT Head Wo Contrast Result Date: 10/17/2023 CLINICAL DATA:  Provided history: Neuro deficit, acute, stroke suspected. Altered mental status. Confusion. EXAM: CT HEAD WITHOUT CONTRAST TECHNIQUE: Contiguous axial images were obtained from the base of the skull through the vertex without intravenous contrast. RADIATION DOSE REDUCTION: This exam was performed according to the departmental dose-optimization program which includes automated exposure control, adjustment of the mA and/or kV according to patient size and/or use of iterative reconstruction technique. COMPARISON:  None. FINDINGS: Brain: Generalized cerebral and cerebellar atrophy. There is no acute intracranial hemorrhage. No demarcated cortical infarct. No extra-axial fluid collection. No evidence of an intracranial mass. No midline shift. Vascular: No hyperdense vessel.  Atherosclerotic calcifications. Skull: No calvarial fracture or aggressive osseous lesion. Sinuses/Orbits: No mass or acute finding within the imaged orbits. Complete opacification of the right maxillary sinus at the imaged levels (with associated chronic reactive osteitis). Mild mucosal thickening within the left maxillary sinus at the imaged levels. Moderate-to-severe right sphenoid sinusitis. Mild left sphenoid sinus mucosal thickening. Bilateral ethmoid sinusitis (moderate-to-severe right, mild left). Mild mucosal thickening within the bilateral frontal sinuses. Other:Left mastoid effusion. Chronic appearing nasal bone fracture deformity (without overlying soft tissue swelling). IMPRESSION: 1. No evidence of an acute intracranial abnormality. 2. Generalized parenchymal atrophy. 3. Paranasal sinus disease as described. 4. Left mastoid effusion. Electronically Signed   By: Rockey Childs D.O.   On: 10/17/2023 13:49     Procedures   Medications Ordered in the ED  ondansetron  (ZOFRAN ) injection 4 mg (4 mg Intravenous Given  10/17/23 1718)  morphine  (PF) 4 MG/ML injection 4 mg (4 mg Intravenous Given 10/17/23 1718)  sodium chloride  0.9 % bolus 1,000 mL (1,000 mLs Intravenous New Bag/Given 10/17/23 1918)  insulin  aspart (novoLOG ) injection 8 Units (8 Units Subcutaneous Given 10/17/23 1919)  iohexol  (OMNIPAQUE ) 350 MG/ML injection 75 mL (75 mLs Intravenous Contrast Given 10/17/23 1907)  Medical Decision Making Patient presenting with some chronic complaints including head and neck pain in association with dysphagia, workup by ENT suggesting a chronic sinusitis, also has been actively treated for temporal arteritis on the left has completed steroids, per chart there is no indication he has followed up with neurology with this diagnosis.  His CT head is negative today for acute findings although it we documents his chronic sinus disease.  He continues to have escalating headache pain, he was given IV morphine  which helped briefly.  His CBC is reassuring but his glucose is significantly elevated at 420, beta hydroxybutyrate acid is normal range, no anion gap.  He is given IV fluids and insulin  to help reduce his glucose level.  During his ED stay his blood pressures have remained in the 170-180 range over 76-83 diastolic.  His blood pressure spiked to 195/157. Recheck with bp in position (was slid over elbow) 157/61.  Given his persistent headache with radiation into neck we will proceed with CT angio head and neck to rule out vascular source of symptoms.  If blood pressure improves and glucose improves and CT imaging is negative, patient can follow-up as an outpatient, discussed with Madelin Gentry, PA-C who assumes care.  Amount and/or Complexity of Data Reviewed Labs: ordered.    Details: Labs including urinalysis with glucose greater than 500, no infection, beta hydroxybutyrate acid normal range, c-Met significant for glucose of 420, his creatinine is okay at 1.07, no anion gap, CBC positive for  anemia with a hemoglobin of 9.9.  This appears to be a chronic finding. Radiology: ordered.    Details: CT head without contrast without acute findings.  Paranasal sinus disease.  Left mastoid effusion.  Risk Prescription drug management.        Final diagnoses:  None    ED Discharge Orders     None          Birdena Mliss RIGGERS 10/17/23 1921    Birdena Mliss, PA-C 10/17/23 RANDALL Suzette Pac, MD 10/18/23 [phone]

## 2023-10-18 ENCOUNTER — Other Ambulatory Visit: Payer: Self-pay | Admitting: Cardiology

## 2023-10-19 ENCOUNTER — Encounter: Payer: Self-pay | Admitting: Neurology

## 2023-10-27 NOTE — Progress Notes (Signed)
Because

## 2023-11-02 NOTE — Progress Notes (Unsigned)
 NEUROLOGY CONSULTATION NOTE  [name] MRN: [id number] DOB: 1946/01/30  Referring provider: Fairy Sermon, MD (ED referral) Primary care provider: Laurance Rafter, MD  Reason for consult:  headache, dysphagia  Assessment/Plan:   Left sided headache - unclear etiology.  With elevated sed rate and CRP, temporal arteritis is possible. However, he never had a biopsy and a biopsy now may not be accurate as he had been taking steroids.  He has significant left sided neck pain, suggesting cervicogenic etiology.  In addition, he has tenderness to palpation of the TMJ, suggesting TMJ dysfunction as well.   Left sided neck pain   For further workup: MRI of cervical spine without contrast to evaluate for cervicogenic etiology X-ray of TMJ Check sed rate and CRP Will start nortriptyline 10mg  at bedtime.  We can increase dose to 25mg  at bedtime in 4 weeks if needed Will prescribe baclofen 10mg  up to three times daily as needed.  Cautioned for drowsiness Patient's grandson will find out dosing of the prednisone and possibly when he may have discontinued it.   Follow up 3 months.  Total time spent in chart and face to face with patient:  63 minutes   Subjective:  [name] is a 78 year old right-handed male with CAD, COPD, GERD, DM 2 with neuropathy, arthritis, asthma and history of prostate cancer, LLE DVT (on Eliquis ) and kidney stones who presents for headache and dysphagia.  History supplemented by ED note.  CT head and CTA head and neck personally reviewed.  Headache: He began experiencing left ear pain in early 2025.  No tinnitus or change in hearing.  He has history of chronic sinusitis and had been treated with antibiotics without improvement.  He also endorsed associated left temporal pain that may radiate down the left side of his jaw and down the left side of his neck.  Denies cervical radicular symptoms.  He thinks maybe vision in his left eye is a little more blurry  than his right eye.  His last eye exam was shortly before onset of these symptoms.  No associated ptosis, conjunctival injection, lacrimation, nausea, vomiting, photophobia or phonophobia.  In May he reportedly had a sed rate of 81 and CRP of 31.  The sed rate had apparently doubled since January and the CRP was normal.  It was noted that his left temporal pulse was weaker than the right side.  It was presumed that he had temporal arteritis and was started on prednisone.  He did not have a temporal artery biopsy.  Headaches persisted, despite treatment with oral steroids.  Concern regarding steroids is that he has uncontrolled diabetes with A1c of 9.2 and glucose in the 300s.  He takes oxycodone  for chronic back pain which also does not relieve headache and neck pain.  Seen in the ED at [facility] on 7/1 for further evaluation.  CT head showed generalized parenchymal atrophy, paranasal sinus disease, asymmetric soft tissue along the left nasopharynx and left mastoid effusion.  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  As of now, he has been taking gabapentin  300mg  twice during the day and three times during the night.  He has been taking his oxycodone  and Tylenol  as needed.  He is unsure if he is actually still taking prednisone.  He doesn't think he is taking it but cannot tell me when he may have discontinued it.  He also does not know the dose.    Denies prior history of headaches.  Dysphagia: For the past couple of years, he has had difficulty swallowing most foods, which has progressively gotten worse.  Reports pain when swallowing and states that his throat feels sore.  He saw ENT and had a Barium swallow in January 2025 which revealed moderate esophageal dysmotility and mild circumferential narrowing involving the distal esophagus resulting in stasis of the barium tablet at that location.   ENT did not find any findings on exam to explain his dysphagia but noted subtle signs of laryngopharyngeal reflux.  He has seen GI and is scheduled for endoscopy later this month.     PAST MEDICAL HISTORY: Past Medical History:  Diagnosis Date   Arthritis    Asthma    Cancer (HCC)    Prostate   COPD (chronic obstructive pulmonary disease) (HCC)    Coronary atherosclerosis of native coronary artery 04/22/2001      Cardiac Catheterization   COVID 10/2020   asymptomatic home test due to covid exposure   Diverticulitis    Dyspnea    WITH EXERTION   Encounter for long-term (current) use of insulin  (HCC)    Esophageal reflux    Gout, unspecified    History of blood transfusion    1 unit after 12-15-2020 surgery no problems with   History of kidney stones    Neuropathy    toes all the time   Old myocardial infarction 2013   Personal history of tobacco use, presenting hazards to health    Postsurgical percutaneous transluminal coronary angioplasty status    Prostate cancer (HCC)    Pure hypercholesterolemia    Skin cancer    Sleep apnea    USES CPAP   Type II or unspecified type diabetes mellitus without mention of complication, not stated as uncontrolled    TYPE 11   Unspecified essential hypertension    Wears glasses 12/24/2020    PAST SURGICAL HISTORY: Past Surgical History:  Procedure Laterality Date   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 Left dominent   CARDIAC CATHETERIZATION  01/26/2011   LAD: Patent proximal stent. 90% calcified eccentic stenosis in med segment, mild LCX disease   CAROTID STENTS     CORONARY ANGIOPLASTY WITH STENT PLACEMENT  2003   Prox LAD: 3.0 X12 BMS   CORONARY ANGIOPLASTY WITH STENT PLACEMENT  01/26/2011   Mid LAD: 3.0 X15 mm Vision BMS. Complicated by a jailed septal branch and periprocedural MI   EYE SURGERY Bilateral    laser for  bleed   HEMORRHOID SURGERY     yrs ago per pt on 12-24-2020   HERNIA REPAIR     INGUINAL YRS AGO per pt on 12-24-2020   HUMERUS FRACTURE SURGERY Left    LEFT TOTAK KNEE REPLACEMENT  2015   IN [facility]   NASAL SINUS SURGERY     yrs ago per pt on 12-24-2020   ORIF FEMUR FRACTURE Left 03/19/2019   Procedure: OPEN REDUCTION INTERNAL FIXATION (ORIF) DISTAL FEMUR FRACTURE;  Surgeon: Celena Sharper, MD;  Location: [facility] OR;  Service: Orthopedics;  Laterality: Left;   ORIF FEMUR FRACTURE Left 08/06/2019   Procedure: REPAIR DISTAL FEMUR NONUNION WITH INFUSE AND ALLOGRAFT;  Surgeon: Celena Sharper, MD;  Location: [facility] OR;  Service: Orthopedics;  Laterality: Left;   RADIOACTIVE SEED IMPLANT N/A 12/28/2020  Procedure: RADIOACTIVE SEED IMPLANT/BRACHYTHERAPY IMPLANT;  Surgeon: Matilda Senior, MD;  Location[facility];  Service: Urology;  Laterality: N/A;   SPACE OAR INSTILLATION N/A 12/28/2020   Procedure: SPACE OAR INSTILLATION;  Surgeon: Matilda Senior, MD;  Location: [facility];  Service: Urology;  Laterality: N/A;   TOTAL KNEE REVISION Left 12/15/2020   Procedure: LEFT TOTAL KNEE REVISION WITH REMOVAL OF HARDWARE;  Surgeon: Ernie Cough, MD;  Location: [facility];  Service: Orthopedics;  Laterality: Left;    MEDICATIONS: Current Outpatient Medications on File Prior to Visit  Medication Sig Dispense Refill   acarbose  (PRECOSE ) 100 MG tablet Take 100 mg by mouth 3 (three) times daily.     acetaminophen  (TYLENOL ) 500 MG tablet Take 1,500 mg by mouth 3 (three) times daily as needed (take with oxycodone  for pain).     allopurinol  (ZYLOPRIM ) 300 MG tablet Take 300 mg by mouth daily.     alum & mag hydroxide-simeth (MAALOX/MYLANTA) 200-200-20 MG/5ML suspension Take 15 mLs by mouth every 6 (six) hours as needed for indigestion or heartburn.     amLODipine  (NORVASC ) 5 MG tablet Take 1 tablet by mouth daily.     atorvastatin  (LIPITOR ) 80 MG tablet TAKE 1 TABLET (80 MG TOTAL) BY  MOUTH DAILY. (STOP SIMVASTATIN ) (Patient taking differently: Take 80 mg by mouth daily.) 30 tablet 6   Azelastine HCl 137 MCG/SPRAY SOLN Place 1 spray into both nostrils 2 (two) times daily.     budesonide-formoterol  (SYMBICORT) 160-4.5 MCG/ACT inhaler Inhale 2 puffs into the lungs 2 (two) times daily.     carvedilol  (COREG ) 25 MG tablet Take 25 mg by mouth 2 (two) times daily with a meal.     cholecalciferol  (VITAMIN D3) 25 MCG (1000 UNIT) tablet Take 1,000 Units by mouth daily.     docusate sodium  (COLACE) 100 MG capsule Take 100 mg by mouth 3 (three) times daily as needed for mild constipation (take with oxycodone ).     ELIQUIS  5 MG TABS tablet Take 5 mg by mouth 2 (two) times daily.     gabapentin  (NEURONTIN ) 300 MG capsule Take 300 mg by mouth 3 (three) times daily.     glipiZIDE  (GLUCOTROL  XL) 5 MG 24 hr tablet Take 5 mg by mouth every morning.     insulin  glargine (LANTUS ) 100 UNIT/ML injection Inject 15-35 Units into the skin in the morning, at noon, and at bedtime.     isosorbide  mononitrate (IMDUR ) 30 MG 24 hr tablet TAKE 1 AND 1/2 TABLETS BY MOUTH ONCE DAILY (Patient taking differently: Take 45 mg by mouth at bedtime.) 135 tablet 1   levocetirizine (XYZAL ) 5 MG tablet Take 5 mg by mouth every evening.     montelukast  (SINGULAIR ) 10 MG tablet Take 10 mg by mouth at bedtime.     Multiple Vitamin (MULTIVITAMIN WITH MINERALS) TABS tablet Take 1 tablet by mouth daily.      nitroGLYCERIN  (NITROSTAT ) 0.4 MG SL tablet Place 1 tablet (0.4 mg total) under the tongue every 5 (five) minutes as needed for chest pain. 25 tablet 3   oxyCODONE -acetaminophen  (PERCOCET/ROXICET) 5-325 MG tablet Take 1 tablet by mouth 3 (three) times daily as needed for moderate pain (pain score 4-6).     PRESCRIPTION MEDICATION Inhale into the lungs at bedtime. CPAP     torsemide  (DEMADEX ) 20 MG tablet Take 1 tablet (20 mg total) by mouth 2 (two) times daily. 180 tablet 2   valsartan (DIOVAN) 80 MG tablet Take 80 mg by  mouth daily.     vitamin C  (VITAMIN C ) 1000 MG tablet Take 1 tablet (1,000 mg total) by mouth daily. 30 tablet 0   zolpidem  (AMBIEN ) 10 MG tablet Take 10 mg by mouth at bedtime as needed for sleep.     No current facility-administered medications on file prior to visit.    ALLERGIES: Allergies  Allergen Reactions   Lisinopril  Cough    FAMILY HISTORY: Family History  Problem Relation Age of Onset   Coronary artery disease Other        Family History   Breast cancer Neg Hx    Colon cancer Neg Hx    Prostate cancer Neg Hx    Pancreatic cancer Neg Hx     Objective:  Blood pressure (!) 127/54, height 5' 8 (1.727 m), weight 206 lb (93.4 kg). General: No acute distress.  Patient appears well-groomed.   Head:  Normocephalic/atraumatic, tenderness to palpation of left greater than right TMJ.   Eyes:  fundi examined but not visualized Neck: supple, left paraspinal tenderness, full range of motion Heart: regular rate and rhythm Neurological Exam: Mental status: alert and oriented to person, place, and time, speech fluent and not dysarthric, language intact. Cranial nerves: CN I: not tested CN II: pupils equal, round and reactive to light, visual fields intact CN III, IV, VI:  full range of motion, no nystagmus, no ptosis CN V: endorses altered sensation to touch of left V1 distribution, otherwise intact. CN VII: upper and lower face symmetric CN VIII: hearing intact CN IX, X: gag intact, uvula midline CN XI: sternocleidomastoid and trapezius muscles intact CN XII: tongue midline Bulk & Tone: normal, no fasciculations. Motor:  muscle strength 5/5 throughout Sensation:  Pinprick and vibratory sensation reduced in feet. Deep Tendon Reflexes:  1+ throughout,  toes downgoing.   Finger to nose testing:  Without dysmetria.   Heel to shin:  Without dysmetria.   Gait:  Cautious and slightly wide based.  Romberg with mild sway.    Thank you for allowing me to take part in the care of  this patient.  Juliene Dunnings, DO  CC: Laurance Rafter, MD

## 2023-11-03 ENCOUNTER — Other Ambulatory Visit

## 2023-11-03 ENCOUNTER — Ambulatory Visit: Admitting: Neurology

## 2023-11-03 ENCOUNTER — Encounter: Payer: Self-pay | Admitting: Neurology

## 2023-11-03 VITALS — BP 127/54 | HR 96 | Ht 68.0 in | Wt 206.0 lb

## 2023-11-03 DIAGNOSIS — R6884 Jaw pain: Secondary | ICD-10-CM | POA: Diagnosis not present

## 2023-11-03 DIAGNOSIS — M542 Cervicalgia: Secondary | ICD-10-CM

## 2023-11-03 DIAGNOSIS — R519 Headache, unspecified: Secondary | ICD-10-CM | POA: Diagnosis not present

## 2023-11-03 MED ORDER — NORTRIPTYLINE HCL 10 MG PO CAPS: 10.0000 mg | ORAL_CAPSULE | Freq: Every day | ORAL | 5 refills | Status: DC

## 2023-11-03 MED ORDER — BACLOFEN 10 MG PO TABS: 10.0000 mg | ORAL_TABLET | Freq: Three times a day (TID) | ORAL | 5 refills | Status: DC | PRN

## 2023-11-03 NOTE — Patient Instructions (Addendum)
 Will check MRI of cervical spine without contrast. We have sent a referral to [facility] for your MRI and they will call you directly to schedule your appointment. They are located at [address] [facility]. If you need to contact them directly please call [phone].  imaging of TMJ Check sed rate and CRP Start nortriptyline 10mg  at bedtime.  We can increase dose in 4 weeks if needed. For flare up of pain, take baclofen 10mg  as needed. Find out dose of prednisone he has been taking and if possible, when he may have stopped it.   Follow up 3 months.

## 2023-11-04 LAB — SEDIMENTATION RATE: Sed Rate: 121 mm/h — ABNORMAL HIGH (ref 0–20)

## 2023-11-04 LAB — C-REACTIVE PROTEIN: CRP: 76.6 mg/L — ABNORMAL HIGH (ref <8.0)

## 2023-11-07 ENCOUNTER — Telehealth: Payer: Self-pay

## 2023-11-07 NOTE — Telephone Encounter (Signed)
 PA not REQ. Case#

## 2023-11-08 ENCOUNTER — Ambulatory Visit: Payer: Self-pay | Admitting: Neurology

## 2023-11-09 NOTE — Progress Notes (Signed)
 Patient advised. Will call to cancel the appt with Radiology for MRI.

## 2023-11-14 ENCOUNTER — Ambulatory Visit (HOSPITAL_COMMUNITY)

## 2023-11-14 ENCOUNTER — Other Ambulatory Visit (HOSPITAL_COMMUNITY)

## 2023-12-12 ENCOUNTER — Telehealth: Payer: Self-pay | Admitting: Cardiology

## 2023-12-12 NOTE — Telephone Encounter (Signed)
 Pts family would like Dr. Alvan to sign off on the pts death certificate.

## 2023-12-14 NOTE — Telephone Encounter (Signed)
 Grandson [name] informed and verbalized understanding.

## 2023-12-18 DEATH — deceased

## 2023-12-20 NOTE — Telephone Encounter (Signed)
 Alfonso with [facility] called in about death certificate. She states PCP is in [place], [place] and they are not in [facility] system. Please advise.

## 2023-12-21 ENCOUNTER — Ambulatory Visit: Payer: Self-pay | Admitting: Neurology

## 2023-12-21 NOTE — Telephone Encounter (Signed)
 Alfonso at [facility] returned staff call.

## 2023-12-21 NOTE — Telephone Encounter (Signed)
 Myles Odor that I had provided Dr. Alvan with contact information to reach out to her today

## 2023-12-22 ENCOUNTER — Telehealth: Payer: Self-pay | Admitting: Cardiology

## 2023-12-22 NOTE — Telephone Encounter (Signed)
 Asked to complete death certificate by family and funeral home as there was no physician immediately involved in his care at time of passing. I last saw the patient in Jan 2023, has been seen by our NP most recently 06/2023. Contacted patient's grandson to obtain additional history, [name]. 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 [name] had spent time with the patient that Saturday and Sunday and reports patient was doing well. [name] came to the patient's house Monday 2025/09/20around 330pm and his knock was unanswered. He returned about 430 to 5 pm later that day and used his key to entry. Upon entering he found his grandfather [name] deceased, he reports his body was cold and pulseless. He called 911 and family as well. No autopsy was requested by the family. I have filled out the death certificate to the best of my ability given the limitations that I was not involved with the patient's immediate care at the time of his death. Certificate has been completed so that the family may have the certificate processed to begin settling estate matters.   Dorn Ross MD

## 2024-01-30 ENCOUNTER — Ambulatory Visit: Admitting: Urology

## 2024-02-05 ENCOUNTER — Ambulatory Visit: Admitting: Cardiology

## 2024-02-12 ENCOUNTER — Ambulatory Visit: Admitting: Neurology
